# Patient Record
Sex: Female | Born: 1935 | Race: White | Hispanic: No | Smoking: Former smoker
Health system: Southern US, Community
[De-identification: ages and names within clinical notes are randomized; demographics above are authoritative.]

## PROBLEM LIST (undated history)

## (undated) DIAGNOSIS — M1990 Unspecified osteoarthritis, unspecified site: Secondary | ICD-10-CM

## (undated) DIAGNOSIS — M545 Low back pain, unspecified: Secondary | ICD-10-CM

## (undated) DIAGNOSIS — G629 Polyneuropathy, unspecified: Secondary | ICD-10-CM

## (undated) DIAGNOSIS — K5190 Ulcerative colitis, unspecified, without complications: Secondary | ICD-10-CM

## (undated) DIAGNOSIS — R42 Dizziness and giddiness: Secondary | ICD-10-CM

## (undated) DIAGNOSIS — J449 Chronic obstructive pulmonary disease, unspecified: Secondary | ICD-10-CM

## (undated) DIAGNOSIS — K219 Gastro-esophageal reflux disease without esophagitis: Secondary | ICD-10-CM

## (undated) DIAGNOSIS — D649 Anemia, unspecified: Secondary | ICD-10-CM

## (undated) DIAGNOSIS — J4489 Other specified chronic obstructive pulmonary disease: Secondary | ICD-10-CM

## (undated) DIAGNOSIS — R0989 Other specified symptoms and signs involving the circulatory and respiratory systems: Secondary | ICD-10-CM

## (undated) DIAGNOSIS — L409 Psoriasis, unspecified: Secondary | ICD-10-CM

## (undated) DIAGNOSIS — F329 Major depressive disorder, single episode, unspecified: Secondary | ICD-10-CM

## (undated) DIAGNOSIS — F32A Depression, unspecified: Secondary | ICD-10-CM

## (undated) DIAGNOSIS — R251 Tremor, unspecified: Secondary | ICD-10-CM

## (undated) DIAGNOSIS — I10 Essential (primary) hypertension: Secondary | ICD-10-CM

## (undated) DIAGNOSIS — E119 Type 2 diabetes mellitus without complications: Secondary | ICD-10-CM

## (undated) HISTORY — DX: Low back pain: M54.5

## (undated) HISTORY — DX: Unspecified osteoarthritis, unspecified site: M19.90

## (undated) HISTORY — PX: EYE SURGERY: SHX253

## (undated) HISTORY — PX: APPENDECTOMY: SHX54

## (undated) HISTORY — DX: Gastro-esophageal reflux disease without esophagitis: K21.9

## (undated) HISTORY — DX: Other specified symptoms and signs involving the circulatory and respiratory systems: R09.89

## (undated) HISTORY — DX: Dizziness and giddiness: R42

## (undated) HISTORY — DX: Major depressive disorder, single episode, unspecified: F32.9

## (undated) HISTORY — DX: Anemia, unspecified: D64.9

## (undated) HISTORY — DX: Tremor, unspecified: R25.1

## (undated) HISTORY — DX: Depression, unspecified: F32.A

## (undated) HISTORY — DX: Low back pain, unspecified: M54.50

## (undated) HISTORY — DX: Ulcerative colitis, unspecified, without complications: K51.90

## (undated) HISTORY — PX: ABDOMINAL HYSTERECTOMY: SHX81

## (undated) HISTORY — DX: Psoriasis, unspecified: L40.9

## (undated) HISTORY — DX: Chronic obstructive pulmonary disease, unspecified: J44.9

## (undated) HISTORY — DX: Other specified chronic obstructive pulmonary disease: J44.89

---

## 2005-10-18 ENCOUNTER — Ambulatory Visit: Payer: Self-pay

## 2007-08-03 ENCOUNTER — Inpatient Hospital Stay: Payer: Self-pay | Admitting: Internal Medicine

## 2007-08-03 ENCOUNTER — Other Ambulatory Visit: Payer: Self-pay

## 2007-09-25 ENCOUNTER — Emergency Department: Payer: Self-pay | Admitting: Emergency Medicine

## 2007-09-25 ENCOUNTER — Other Ambulatory Visit: Payer: Self-pay

## 2008-01-22 ENCOUNTER — Ambulatory Visit: Payer: Self-pay | Admitting: Family Medicine

## 2008-10-15 ENCOUNTER — Ambulatory Visit: Payer: Self-pay | Admitting: Rheumatology

## 2008-10-15 ENCOUNTER — Ambulatory Visit: Payer: Self-pay | Admitting: Specialist

## 2009-07-16 ENCOUNTER — Observation Stay: Payer: Self-pay | Admitting: Internal Medicine

## 2010-01-04 ENCOUNTER — Ambulatory Visit: Payer: Self-pay | Admitting: Gastroenterology

## 2010-01-05 LAB — PATHOLOGY REPORT

## 2010-04-01 ENCOUNTER — Emergency Department: Payer: Self-pay | Admitting: Emergency Medicine

## 2010-04-08 ENCOUNTER — Ambulatory Visit: Payer: Self-pay | Admitting: Specialist

## 2010-04-11 ENCOUNTER — Ambulatory Visit: Payer: Self-pay | Admitting: Specialist

## 2011-05-25 ENCOUNTER — Ambulatory Visit: Payer: Self-pay | Admitting: Orthopedic Surgery

## 2011-07-12 ENCOUNTER — Encounter: Payer: Self-pay | Admitting: Orthopedic Surgery

## 2011-08-04 ENCOUNTER — Encounter: Payer: Self-pay | Admitting: Orthopedic Surgery

## 2011-09-04 ENCOUNTER — Encounter: Payer: Self-pay | Admitting: Orthopedic Surgery

## 2011-09-15 ENCOUNTER — Emergency Department: Payer: Self-pay | Admitting: Internal Medicine

## 2011-09-15 LAB — COMPREHENSIVE METABOLIC PANEL
Albumin: 3.9 g/dL (ref 3.4–5.0)
Alkaline Phosphatase: 66 U/L (ref 50–136)
Anion Gap: 9 (ref 7–16)
Bilirubin,Total: 0.3 mg/dL (ref 0.2–1.0)
Calcium, Total: 9.8 mg/dL (ref 8.5–10.1)
Co2: 31 mmol/L (ref 21–32)
Creatinine: 1.04 mg/dL (ref 0.60–1.30)
EGFR (African American): 60 — ABNORMAL LOW
EGFR (Non-African Amer.): 51 — ABNORMAL LOW
Glucose: 124 mg/dL — ABNORMAL HIGH (ref 65–99)
Osmolality: 282 (ref 275–301)
Potassium: 3.7 mmol/L (ref 3.5–5.1)
SGOT(AST): 28 U/L (ref 15–37)
Sodium: 139 mmol/L (ref 136–145)
Total Protein: 7.7 g/dL (ref 6.4–8.2)

## 2011-09-15 LAB — CK TOTAL AND CKMB (NOT AT ARMC)
CK, Total: 69 U/L (ref 21–215)
CK-MB: 0.9 ng/mL (ref 0.5–3.6)

## 2011-09-15 LAB — TROPONIN I: Troponin-I: <0.02 ng/mL

## 2011-09-15 LAB — CBC
HCT: 36.8 % (ref 35.0–47.0)
MCH: 32.9 pg (ref 26.0–34.0)
RBC: 3.63 10*6/uL — ABNORMAL LOW (ref 3.80–5.20)
RDW: 12.6 % (ref 11.5–14.5)
WBC: 11.3 10*3/uL — ABNORMAL HIGH (ref 3.6–11.0)

## 2011-09-15 LAB — PROTIME-INR: Prothrombin Time: 13.1 secs (ref 11.5–14.7)

## 2011-10-04 ENCOUNTER — Encounter: Payer: Self-pay | Admitting: Orthopedic Surgery

## 2011-11-20 ENCOUNTER — Ambulatory Visit: Payer: Self-pay | Admitting: Orthopedic Surgery

## 2012-02-18 ENCOUNTER — Inpatient Hospital Stay: Payer: Self-pay | Admitting: Internal Medicine

## 2012-02-18 LAB — CBC
HCT: 42.4 % (ref 35.0–47.0)
HGB: 14.4 g/dL (ref 12.0–16.0)
MCH: 33.7 pg (ref 26.0–34.0)
MCV: 100 fL (ref 80–100)
RDW: 12.3 % (ref 11.5–14.5)
WBC: 13.4 10*3/uL — ABNORMAL HIGH (ref 3.6–11.0)

## 2012-02-18 LAB — COMPREHENSIVE METABOLIC PANEL
Alkaline Phosphatase: 129 U/L (ref 50–136)
Bilirubin,Total: 0.7 mg/dL (ref 0.2–1.0)
Calcium, Total: 10.4 mg/dL — ABNORMAL HIGH (ref 8.5–10.1)
Chloride: 88 mmol/L — ABNORMAL LOW (ref 98–107)
Co2: 29 mmol/L (ref 21–32)
Creatinine: 1.26 mg/dL (ref 0.60–1.30)
EGFR (African American): 47 — ABNORMAL LOW
EGFR (Non-African Amer.): 40 — ABNORMAL LOW
Glucose: 655 mg/dL (ref 65–99)
Osmolality: 291 (ref 275–301)
Potassium: 4.4 mmol/L (ref 3.5–5.1)
SGPT (ALT): 46 U/L (ref 12–78)

## 2012-02-18 LAB — URINALYSIS, COMPLETE
Bilirubin,UR: NEGATIVE
Blood: NEGATIVE
Ph: 6 (ref 4.5–8.0)
Protein: 30
RBC,UR: 1 /HPF (ref 0–5)

## 2012-02-18 LAB — CK TOTAL AND CKMB (NOT AT ARMC)
CK, Total: 66 U/L (ref 21–215)
CK-MB: 1.5 ng/mL (ref 0.5–3.6)

## 2012-02-18 LAB — TROPONIN I: Troponin-I: <0.02 ng/mL

## 2012-02-18 LAB — OSMOLALITY: Osmolality: 547 mOsm/kg (ref 280–301)

## 2012-02-19 LAB — CBC WITH DIFFERENTIAL/PLATELET
Basophil #: 0 10*3/uL (ref 0.0–0.1)
Eosinophil %: 3.5 %
HCT: 38.4 % (ref 35.0–47.0)
HGB: 13.3 g/dL (ref 12.0–16.0)
MCH: 34.1 pg — ABNORMAL HIGH (ref 26.0–34.0)
MCHC: 34.7 g/dL (ref 32.0–36.0)
MCV: 98 fL (ref 80–100)
Monocyte #: 0.8 x10 3/mm (ref 0.2–0.9)
Monocyte %: 8.4 %
Neutrophil #: 6.1 10*3/uL (ref 1.4–6.5)
Neutrophil %: 63.6 %
Platelet: 262 10*3/uL (ref 150–440)
RBC: 3.9 10*6/uL (ref 3.80–5.20)

## 2012-02-19 LAB — BASIC METABOLIC PANEL
Anion Gap: 12 (ref 7–16)
BUN: 16 mg/dL (ref 7–18)
Chloride: 101 mmol/L (ref 98–107)
Co2: 27 mmol/L (ref 21–32)
Creatinine: 0.89 mg/dL (ref 0.60–1.30)
Glucose: 141 mg/dL — ABNORMAL HIGH (ref 65–99)
Osmolality: 283 (ref 275–301)
Potassium: 3.4 mmol/L — ABNORMAL LOW (ref 3.5–5.1)
Sodium: 140 mmol/L (ref 136–145)

## 2012-02-20 LAB — BASIC METABOLIC PANEL
Anion Gap: 10 (ref 7–16)
Co2: 26 mmol/L (ref 21–32)
Creatinine: 0.82 mg/dL (ref 0.60–1.30)
EGFR (African American): >60
EGFR (Non-African Amer.): >60
Glucose: 112 mg/dL — ABNORMAL HIGH (ref 65–99)
Sodium: 143 mmol/L (ref 136–145)

## 2014-09-22 NOTE — Discharge Summary (Signed)
PATIENT NAME:  Chelsea [name], Chelsea [name] MR#:  [id number] DATE OF BIRTH:  [birth year]  DATE OF ADMISSION:  02/18/2012 DATE OF DISCHARGE:  02/20/2012  ADMITTING DIAGNOSES: Confusion, hallucinations, hyperglycemia.   DISCHARGE DIAGNOSES:  1. Acute encephalopathy due to nonketotic hyperosmolar state with severe dehydration, severe hyperglycemia. Mental status back to her normal.  2. New onset diabetes. The patient was seen by Dr. Gabriel Carina and  started on oral treatment.  3. Hyponatremia felt to be pseudohyponatremia from hyperglycemia.  Sodium normalized after correction of her blood glucose.  4. Hypertension.  5. Neuropathy.  6. History of ulcerative colitis.  7. Neck spasm.  8. History of ulcerative colitis.  9. Gastroesophageal reflux disease.  10. Osteoarthritis  11. Hypertension.  12. History of chronic obstructive pulmonary disease.   CONSULTANTS: Dr. Gabriel Carina  PERTINENT LABORATORY DATA AND EVALUATIONS: Serum glucose on admission 655, BUN 22, creatinine 1.2, sodium 128, potassium 4.4, chloride 88, bicarbonate 29. LFTs were normal. CPK was 66. Troponin less than 0.02. WBC count 13.4, hemoglobin 14.4, platelet count 329. Urinalysis showed glucosuria, 1+ leukocytes, 15 WBCs. Her sodium on day two on 09/17 was 143. Hemoglobin A1c was 10. EKG showed sinus tachycardia on presentation.   HOSPITAL COURSE: Please refer to the History and Physical done by the admitting physician. The patient is a 81 year old white female who was brought to the ED after having confusion and hallucinations. She was also noted to have a rash throughout her body and was seen by primary care physician. The patient was started on a prednisone taper. She was on a 10-day taper. On the third day of the taper her daughters noticed she was having delusions, hallucinations, seeing things. Her rash was significantly improved. She came to the ED and was noted to have a blood sugar in the 700s. We were asked to admit the patient for hyperosmolar  nonketotic state. She was treated with aggressive IV fluids and was started on insulin drip. She was closely monitored in the Intensive Care Unit. Overnight her blood sugars started trending downwards and she was started on insulin. Endocrinology consult was obtained. Dr. Gabriel Carina felt that the patient would do okay with oral treatment. She was started on Glucovance which she tolerated well. Her sugars were stable at discharge. At this time, the patient is doing much better and is stable for discharge. She will need closer monitoring as an outpatient.   DISCHARGE MEDICATIONS:  1. Citrucel 1 tab p.o. t.i.d.  2. Metamucil 500, 4 caps p.o. b.i.d.  3. Vitamin B complex 1 tab p.o. daily.  4. Prilosec 20 daily.  5. Advair Diskus 250/50, 1 inhalation daily.  6. Etodolac 400, 1 tabs p.o. b.i.d.  7. Hydrochlorothiazide/losartan 25/100 daily.  8. Paroxetine 40 daily.  9. Acetaminophen hydrocodone 325/5, 1 tab p.o. q. 6.  10. Metoprolol succinate 25 mg 1 tab p.o. b.i.d.  11. Allergy Relief 1 tab p.o. b.i.d.  12. Folic acid 1 mg daily.  13. Benzonatate 100 mg 1 tab p.o. t.i.d. as needed.  14. Sulfasalazine 2 tabs b.i.d.  15. Gabapentin 900 mg 3 times per day.  16. Glyburide/ metformin 2.5/500, 1 tab p.o. b.i.d.  17. Cyclobenzaprine 10 mg 1 tab p.o. t.i.d. as needed.   HOME OXYGEN: Yes. The patient is to continue to use oxygen as previously.  FOLLOWUP:  1. Follow up with Dr. Gabriel Carina in 1 to 2 weeks. 2. Follow up with Dr. Lovie Macadamia in 1 to 2 weeks.   The patient's daughter is recommended to keep a log of blood  sugars to take to Dr. Gabriel Carina.  She is told to check blood glucose before each meal.       TIME SPENT: 32 minutes.   ____________________________ Lafonda Mosses Posey Pronto, MD shp:bjt D: 02/21/2012 14:07:45 ET T: 02/21/2012 14:33:07 ET JOB#: [id number]  cc: Dajia Gunnels H. Posey Pronto, MD, <Dictator> Youlanda Roys. Lovie Macadamia, MD Alric Seton MD ELECTRONICALLY SIGNED 02/22/2012 21:50

## 2014-09-22 NOTE — Consult Note (Signed)
PATIENT NAME:  Chelsea [name], Chelsea [name] MR#:  [id number] DATE OF BIRTH:  July 27, 1930  DATE OF CONSULTATION:  02/19/2012  REFERRING PHYSICIAN: Abel Presto, MD CONSULTING PHYSICIAN:  A. Lavone Orn, MD  CHIEF COMPLAINT: Uncontrolled diabetes.   HISTORY OF PRESENT ILLNESS: This is a 81 year old female who was admitted yesterday with altered mental status and severe hyperglycemia. Initial blood sugar was over 700. She did not have any evidence of ketoacidosis. She was initiated on IV insulin and IV fluids. Blood sugars quickly normalized and she was transitioned to subcutaneous insulin this morning. She was given Lantus 20 units one time and the IV insulin was stopped. Her appetite has been fair. She denies any nausea. She denies any pain. Altered mental status seems to have resolved. The patient still does not feel quite back to herself, however. She has had a diagnosis of diabetes, diet controlled, since 2010. Her A1c had been consistently under 7% up until it was recently checked on 02/12/2012 when it was found to be 9.1%. She was also started on that date on prednisone, a tapering course over 12 days, initially at 60 mg daily for a facial rash. In the two days preceding her hospitalization, she had noted increased thirst and polyuria. She denies any blurred vision or weight change.   PAST MEDICAL HISTORY:  1. Diabetes mellitus.  2. Hypertension.  3. Osteoarthritis.  4. Chronic obstructive pulmonary disease/asthma.  5. Obesity.  6. Peripheral neuropathy.  7. Psoriasis.  8. Anemia.  9. Osteoarthritis.  10. Gastroesophageal reflux disease.   PAST SURGICAL HISTORY:  1. Cholecystectomy.  2. Hysterectomy.  3. Right foot spur removal.  4. Cataract repair.  5. Left knee arthroscopy.   CURRENT INPATIENT MEDICATIONS:  1. Etodolac 400 mg b.i.d.  2. Folic acid 1 mg daily.  3. Neurontin 900 mg t.i.d.  4. Toprol-XL 25 mg b.i.d.  5. Sulfasalazine 1000 mg every 12 hours.  6. Lantus 20 units one time  daily.  7. NovoLog insulin modified sliding scale.  8. Advair Diskus 250/50, one puff b.i.d.   ALLERGIES: No known drug allergies.   SOCIAL HISTORY: The patient is a widow. She is retired. No tobacco use, quit in 1992. No alcohol use.   FAMILY HISTORY: Mother had diabetes.   REVIEW OF SYSTEMS: GENERAL: No recent weight change. No fever. HEENT: No blurred vision. She does report occasionally "seeing spots" for the last two days only, not changed since hospitalization. No sore throat. NECK: No neck pain. No dysphasia. CARDIAC: No chest pain. No palpitations. PULMONARY: No cough. No wheeze. She does have shortness of breath and uses O2 at night. GASTROINTESTINAL: Fair appetite. No nausea or vomiting. EXTREMITIES: Denies leg swelling. SKIN: Denies rash or pruritus. HEME: Denies easy bruisability or recent bleeding. ENDOCRINE: Denies heat or cold intolerance. NEUROLOGIC: Denies headaches or recent falls.   PHYSICAL EXAMINATION:   VITAL SIGNS: Height 61.9 inches, weight 186 pounds, BMI 34.1, temperature 97.9, pulse 86, respirations 20, blood pressure 147/77, pulse oximetry 94% on 2 liters O2.   GENERAL: Obese white female in no acute distress.   HEENT: Extraocular movements are intact. Oropharynx is clear. Mucous membranes moist.   NECK: Supple. No thyromegaly. No neck tenderness.   CARDIAC: Regular rate and rhythm. No carotid bruit.   LUNGS: Clear to auscultation bilaterally. No wheeze. Good inspiratory effort.   ABDOMEN: Diffusely soft, nontender, nondistended.   EXTREMITIES: No edema is present.   NEUROLOGIC: No appreciable sensory deficits.   SKIN: No rash or pruritus.   PSYCHIATRIC: Alert  and oriented x3.   LABORATORY, RADIOLOGICAL AND DIAGNOSTIC DATA: Glucose 141, BUN 16, creatinine 0.89, sodium 140, potassium 3.4, chloride 101, CO2 27, eGFR greater than 60, calcium 9.3. Hematocrit 38.4, WBC 9.5, and platelets 262.   ASSESSMENT: A 81 year old female with history of  diet-controlled diabetes, recently worsened prior to admission with an A1c of 9.1% last week. Sugars worsened further after the initiation of steroids, prompting uncontrolled severe hyperglycemia and the altered mental status.   RECOMMENDATIONS:  1. Seems to be doing fairly well on once a day insulin. I suspect she might have reasonable control on oral hypoglycemic medications. I plan to add Glucovance 2.5/500 mg 1 tab twice daily.  2. Continue before meals and at bedtime fingerstick blood sugar monitoring with sliding scale. I plan to modify the sliding scale slightly to make it less aggressive.  3. I will arrange for outpatient follow-up in one week.  4. From diabetes standpoint, the patient likely could go home tomorrow if sugars remain under 250.   Thank you for the kind request for consultation.  ____________________________ A. Lavone Orn, MD ams:ap D: 02/19/2012 16:17:12 ET              T: 02/20/2012 10:17:01 ET JOB#: [id number] cc: A. Lavone Orn, MD, <Dictator> Sherlon Handing MD ELECTRONICALLY SIGNED 02/23/2012 13:54

## 2014-09-22 NOTE — Consult Note (Signed)
Chief Complaint and History:   Referring Physician Dr. Verdell Carmine    Chief Complaint uncontrolled diabetes   Allergies:  No Known Allergies:   Assessment/Plan:   Assessment/Plan This is a 81 year old female seen in consultation. [facility] from [facility] and [facility] were reviewed. She has had diabetes since 2010 (A1c was 6.7% in 2010) which has been managed with diet alone. She was treated with prednisone for a rash starting on 02/12/12 and this likely provoked severe hyperglycemia. She presented on 9/15 with glucose level >700 and altered mental status. After treatment with IVF and IV insulin, sugars normalized. She received Lantus 20 units one time today.  A / Uncontrolled diabetes  P/ 1. Will try oral medications for diabetes. She has a normal creatinine. Will start glucovance 2.5-500 mg po bid. Continue SSI and qACHS FSBS. Will DC insulin at this time, although if she requires significant insuolin on her SSI, we could add mack some bedtime basal insulin. 2. Needs diabetes education and a glucometer prior to discharge.  Will follow with you. Full consult to be dictated. Will arrange out-patient follow-up.   Electronic Signatures: Judi Cong (MD)  (Signed 16-Sep-13 14:01)  Authored: Chief Complaint and History, ALLERGIES, Assessment/Plan   Last Updated: 16-Sep-13 14:01 by Judi Cong (MD)

## 2014-09-22 NOTE — H&P (Signed)
PATIENT NAME:  Chelsea [name], BORAN MR#:  [id number] DATE OF BIRTH:  30-Apr-1931  DATE OF ADMISSION:  02/18/2012  PRIMARY CARE PHYSICIAN: Juluis Pitch, MD   CHIEF COMPLAINT: Confusion, hallucinations, and new onset diabetes.   HISTORY OF PRESENT ILLNESS: This is a 81 year old female who presents to the Emergency Room due to having some delirium and some confusion and noted to have severe hyperglycemia. The patient apparently was noted to have a rash throughout her body and was seen by her primary care physician, Dr. Lovie Macadamia, the early part of this week and started on a prednisone taper. The patient is on a 10-day taper starting at 50 mg. The patient is on her third day of her taper and her daughter noticed that she has been having more delusions and hallucinations where she has been seeing things. Her rash was starting to subside but her neurological symptoms were not improving. As per the daughter usually when she has a urinary tract infection this happens to her so she got her mother's urine tested but the results are still pending. Since her symptoms were not improving, she brought her to Acute Care at [facility]. At the Acute Care the patient's blood sugar was noted to be over 700. She was brought to the ER and was noted to have persistent hyperglycemia and noted to be in a nonketotic hyperosmolar state. The patient was given IV fluids and started on an insulin drip and hospitalist service was contacted for further treatment and evaluation.   REVIEW OF SYSTEMS: CONSTITUTIONAL: No documented fever. No weight gain, no weight loss. EYES: No blurry or double vision. ENT: No tinnitus, no postnasal drip, no redness of the oropharynx. RESPIRATORY: No cough, no wheeze, no hemoptysis, no dyspnea. CARDIOVASCULAR: No chest pain, no orthopnea, no palpitations, no syncope. GI: No nausea, no vomiting, no diarrhea, no abdominal pain, no melena, no hematochezia. GU: No dysuria, no hematuria. Positive polyuria.  ENDOCRINE: Positive polyuria. No nocturia. Positive increased thirst. No increased heat or cold intolerance. HEME: No anemia, no bruising, no bleeding. INTEGUMENTARY: No rashes, no lesions. MUSCULOSKELETAL: No arthritis, no swelling, no gout. NEUROLOGIC: No numbness, no tingling, no ataxia, no seizure-type activity. PSYCH: No anxiety, no insomnia, no ADD.   PAST MEDICAL HISTORY:  1. Hypertension. 2. Osteoarthritis. 3. Gastroesophageal reflux disease.  4. Ulcerative colitis. 5. Neuropathy. 6. Chronic obstructive pulmonary disease.   ALLERGIES: No known drug allergies.   SOCIAL HISTORY: No smoking. No alcohol abuse. No illicit drug abuse. Lives at home with her daughter.   FAMILY HISTORY: Significant for coronary disease and also diabetes on mother and father's side of the family.   CURRENT MEDICATIONS:  1. Advair 250/50 one puff b.i.d.  2. Benadryl as needed.  3. Tessalon Perles t.i.d. as needed. 4. Citracal with Vitamin D 1 tab b.i.d.  5. Etodolac 400 mg b.i.d.  6. Folic acid 1 mg daily.  7. Gabapentin 900 mg t.i.d.  8. Metamucil 500 mg four caps t.i.d. as needed.  9. Metoprolol succinate 25 mg b.i.d.  10. Seroquel 25 mg at bedtime.  11. Prilosec 20 mg daily.  12. Sulfasalazine 500 mg 2 tabs b.i.d.  13. Vitamin B supplement daily.   PHYSICAL EXAMINATION ON ADMISSION:   VITAL SIGNS: Temperature 97.3, pulse 95, respirations 20, blood pressure 155/69, sats 92% on room air.   GENERAL: She is a pleasant appearing female but in no apparent distress.   HEENT: Atraumatic, normocephalic. Extraocular muscles are intact. Pupils equal and reactive to light. Sclerae anicteric. No conjunctival  injection. No pharyngeal erythema.   NECK: Supple. No jugular venous distention. No bruits. No lymphadenopathy. No thyromegaly.   HEART: Regular rate and rhythm. No murmurs, no rubs, no clicks.   LUNGS: Clear to auscultation bilaterally. No rales, no rhonchi, no wheezes.   ABDOMEN: Soft, flat,  nontender, nondistended. Has good bowel sounds. No hepatosplenomegaly appreciated.   EXTREMITIES: No evidence of any cyanosis, clubbing, or peripheral edema. Has +2 pedal and radial pulses bilaterally.   NEUROLOGIC: The patient is alert, awake, and oriented x3 with no focal motor or sensory deficits appreciated bilaterally.   SKIN: Moist and warm with no rashes appreciated.   LYMPHATIC: There is no cervical or axillary lymphadenopathy.   LABORATORY, DIAGNOSTIC, AND RADIOLOGICAL DATA: Serum glucose 655, BUN 22, creatinine 1.2, sodium 128, potassium 4.4, chloride 88, bicarb 29. LFTs are within normal limits. CK 66. Troponin less than 0.02. White cell count 13.4, hemoglobin 14.4, hematocrit 42.4, platelet count 329. Urinalysis shows glucosuria, 1+ leukocyte esterase, 5 white cells.   ASSESSMENT AND PLAN: This is a 81 year old female with history of ulcerative colitis, COPD, neuropathy, hypertension, gastroesophageal reflux disease, and osteoarthritis who presents to the hospital with confusion, hallucinations and noted to be in a nonketotic hyperosmolar state with new onset diabetes.  1. Nonketotic hyperosmolar state. The patient presented with significantly elevated blood sugars likely due to her new onset diabetes. No evidence of any anion gap. She has a strong family history of diabetes, therefore, this is probably type II diabetes. For now will aggressively treat her with IV fluids, place on an insulin drip, non-DKA protocol. Follow her blood sugars closely. Follow her mental status which seems to be at baseline presently.  2. New onset diabetes. She probably has type II diabetes as mentioned. She has a strong family history. She may have had early glucose intolerance over the past few years and because she was started on a prednisone taper for a rash this lead to her elevated sugars showing that she probably had underlying diabetes. For now I will continue aggressive IV fluids. Insulin drip. Follow  her blood sugars closely. Will get an Endocrinology consult with Dr. Gabriel Carina in the morning. Also, refer her for diabetic lifestyle.  3. Hyponatremia. This is likely pseudohyponatremia from the hyperglycemia and should improve with correction of her blood sugars. Will follow her sodium.  4. Hypertension. Continue metoprolol.  5. Neuropathy. Continue Neurontin.  6. History of ulcerative colitis. Currently is stable. No evidence of any diarrhea. Continue sulfasalazine.  7. Gastroesophageal reflux disease. Continue Prilosec.  8. Osteoarthritis. Continue etodolac.   CODE STATUS: The patient is a FULL CODE.   TIME SPENT WITH THE ADMISSION: 50 minutes.   ____________________________ Belia Heman. Verdell Carmine, MD vjs:drc D: 02/18/2012 17:58:26 ET T: 02/19/2012 05:43:05 ET JOB#: [id number]  cc: Belia Heman. Verdell Carmine, MD, <Dictator> Youlanda Roys. Lovie Macadamia, MD Henreitta Leber MD ELECTRONICALLY SIGNED 02/19/2012 20:25

## 2015-02-06 ENCOUNTER — Inpatient Hospital Stay
Admission: EM | Admit: 2015-02-06 | Discharge: 2015-02-07 | DRG: 195 | Disposition: A | Discharge status: Home / Self Care | Payer: Medicare Other | Attending: Internal Medicine | Admitting: Internal Medicine

## 2015-02-06 ENCOUNTER — Encounter: Payer: Self-pay | Admitting: *Deleted

## 2015-02-06 ENCOUNTER — Emergency Department: Payer: Medicare Other

## 2015-02-06 DIAGNOSIS — R748 Abnormal levels of other serum enzymes: Secondary | ICD-10-CM | POA: Diagnosis present

## 2015-02-06 DIAGNOSIS — J449 Chronic obstructive pulmonary disease, unspecified: Secondary | ICD-10-CM | POA: Diagnosis present

## 2015-02-06 DIAGNOSIS — E1142 Type 2 diabetes mellitus with diabetic polyneuropathy: Secondary | ICD-10-CM | POA: Diagnosis present

## 2015-02-06 DIAGNOSIS — J189 Pneumonia, unspecified organism: Secondary | ICD-10-CM | POA: Diagnosis not present

## 2015-02-06 DIAGNOSIS — I10 Essential (primary) hypertension: Secondary | ICD-10-CM | POA: Diagnosis present

## 2015-02-06 DIAGNOSIS — Z87891 Personal history of nicotine dependence: Secondary | ICD-10-CM | POA: Diagnosis not present

## 2015-02-06 DIAGNOSIS — Z888 Allergy status to other drugs, medicaments and biological substances status: Secondary | ICD-10-CM

## 2015-02-06 DIAGNOSIS — D72829 Elevated white blood cell count, unspecified: Secondary | ICD-10-CM | POA: Diagnosis not present

## 2015-02-06 DIAGNOSIS — Z90710 Acquired absence of both cervix and uterus: Secondary | ICD-10-CM

## 2015-02-06 DIAGNOSIS — Z833 Family history of diabetes mellitus: Secondary | ICD-10-CM | POA: Diagnosis not present

## 2015-02-06 DIAGNOSIS — R55 Syncope and collapse: Secondary | ICD-10-CM | POA: Diagnosis present

## 2015-02-06 DIAGNOSIS — Z66 Do not resuscitate: Secondary | ICD-10-CM | POA: Diagnosis present

## 2015-02-06 DIAGNOSIS — W19XXXA Unspecified fall, initial encounter: Secondary | ICD-10-CM

## 2015-02-06 DIAGNOSIS — Z8249 Family history of ischemic heart disease and other diseases of the circulatory system: Secondary | ICD-10-CM | POA: Diagnosis not present

## 2015-02-06 DIAGNOSIS — R531 Weakness: Secondary | ICD-10-CM

## 2015-02-06 DIAGNOSIS — R918 Other nonspecific abnormal finding of lung field: Secondary | ICD-10-CM | POA: Diagnosis present

## 2015-02-06 DIAGNOSIS — A419 Sepsis, unspecified organism: Secondary | ICD-10-CM | POA: Diagnosis present

## 2015-02-06 HISTORY — DX: Chronic obstructive pulmonary disease, unspecified: J44.9

## 2015-02-06 HISTORY — DX: Type 2 diabetes mellitus without complications: E11.9

## 2015-02-06 LAB — URINALYSIS COMPLETE WITH MICROSCOPIC (ARMC ONLY)
Bacteria, UA: NONE SEEN
Bilirubin Urine: NEGATIVE
GLUCOSE, UA: NEGATIVE mg/dL
KETONES UR: NEGATIVE mg/dL
Leukocytes, UA: NEGATIVE
NITRITE: NEGATIVE
Protein, ur: 100 mg/dL — AB
SPECIFIC GRAVITY, URINE: 1.01 (ref 1.005–1.030)
Squamous Epithelial / LPF: NONE SEEN
pH: 7 (ref 5.0–8.0)

## 2015-02-06 LAB — CBC WITH DIFFERENTIAL/PLATELET
Basophils Absolute: 0.1 10*3/uL (ref 0–0.1)
Basophils Relative: 0 %
EOS ABS: 0 10*3/uL (ref 0–0.7)
EOS PCT: 0 %
HCT: 35.6 % (ref 35.0–47.0)
Hemoglobin: 11.8 g/dL — ABNORMAL LOW (ref 12.0–16.0)
LYMPHS ABS: 0.8 10*3/uL — AB (ref 1.0–3.6)
LYMPHS PCT: 4 %
MCH: 31.6 pg (ref 26.0–34.0)
MCHC: 33.2 g/dL (ref 32.0–36.0)
MCV: 95.3 fL (ref 80.0–100.0)
MONO ABS: 1 10*3/uL — AB (ref 0.2–0.9)
MONOS PCT: 5 %
Neutro Abs: 20.3 10*3/uL — ABNORMAL HIGH (ref 1.4–6.5)
Neutrophils Relative %: 91 %
PLATELETS: 310 10*3/uL (ref 150–440)
RBC: 3.74 MIL/uL — ABNORMAL LOW (ref 3.80–5.20)
RDW: 13.3 % (ref 11.5–14.5)
WBC: 22.3 10*3/uL — AB (ref 3.6–11.0)

## 2015-02-06 LAB — COMPREHENSIVE METABOLIC PANEL
ALT: 34 U/L (ref 14–54)
ANION GAP: 9 (ref 5–15)
AST: 101 U/L — ABNORMAL HIGH (ref 15–41)
Albumin: 4 g/dL (ref 3.5–5.0)
Alkaline Phosphatase: 57 U/L (ref 38–126)
BUN: 22 mg/dL — ABNORMAL HIGH (ref 6–20)
CHLORIDE: 98 mmol/L — AB (ref 101–111)
CO2: 29 mmol/L (ref 22–32)
CREATININE: 0.86 mg/dL (ref 0.44–1.00)
Calcium: 9.6 mg/dL (ref 8.9–10.3)
Glucose, Bld: 120 mg/dL — ABNORMAL HIGH (ref 65–99)
Potassium: 4.3 mmol/L (ref 3.5–5.1)
SODIUM: 136 mmol/L (ref 135–145)
Total Bilirubin: 0.4 mg/dL (ref 0.3–1.2)
Total Protein: 7.1 g/dL (ref 6.5–8.1)

## 2015-02-06 LAB — GLUCOSE, CAPILLARY: Glucose-Capillary: 118 mg/dL — ABNORMAL HIGH (ref 65–99)

## 2015-02-06 LAB — CK: CK TOTAL: 4670 U/L — AB (ref 38–234)

## 2015-02-06 LAB — TROPONIN I: TROPONIN I: 0.19 ng/mL — AB (ref <0.031)

## 2015-02-06 MED ORDER — ENOXAPARIN SODIUM 40 MG/0.4ML ~~LOC~~ SOLN
40.0000 mg | SUBCUTANEOUS | Status: DC
  Administered 2015-02-06: 40 mg via SUBCUTANEOUS
  Filled 2015-02-06: qty 0.4

## 2015-02-06 MED ORDER — ONDANSETRON HCL 4 MG PO TABS: 4.0000 mg | ORAL_TABLET | Freq: Four times a day (QID) | ORAL | Status: DC | PRN

## 2015-02-06 MED ORDER — SULFASALAZINE 500 MG PO TBEC
1000.0000 mg | DELAYED_RELEASE_TABLET | Freq: Two times a day (BID) | ORAL | Status: DC
  Filled 2015-02-06 (×3): qty 2

## 2015-02-06 MED ORDER — SODIUM CHLORIDE 0.9 % IV SOLN
INTRAVENOUS | Status: DC
  Administered 2015-02-06: 23:00:00 via INTRAVENOUS

## 2015-02-06 MED ORDER — ACETAMINOPHEN 650 MG RE SUPP: 650.0000 mg | Freq: Four times a day (QID) | RECTAL | Status: DC | PRN

## 2015-02-06 MED ORDER — LEVOFLOXACIN IN D5W 750 MG/150ML IV SOLN
750.0000 mg | INTRAVENOUS | Status: DC
  Filled 2015-02-06: qty 150

## 2015-02-06 MED ORDER — ONDANSETRON HCL 4 MG/2ML IJ SOLN: 4.0000 mg | Freq: Four times a day (QID) | INTRAMUSCULAR | Status: DC | PRN

## 2015-02-06 MED ORDER — LEVOFLOXACIN IN D5W 750 MG/150ML IV SOLN
750.0000 mg | Freq: Once | INTRAVENOUS | Status: AC
  Administered 2015-02-06: 750 mg via INTRAVENOUS
  Filled 2015-02-06: qty 150

## 2015-02-06 MED ORDER — MOMETASONE FURO-FORMOTEROL FUM 100-5 MCG/ACT IN AERO
2.0000 | INHALATION_SPRAY | Freq: Two times a day (BID) | RESPIRATORY_TRACT | Status: DC
  Administered 2015-02-06 – 2015-02-07 (×2): 2 via RESPIRATORY_TRACT
  Filled 2015-02-06: qty 8.8

## 2015-02-06 MED ORDER — ASPIRIN 81 MG PO CHEW
324.0000 mg | CHEWABLE_TABLET | Freq: Once | ORAL | Status: AC
  Administered 2015-02-06: 324 mg via ORAL
  Filled 2015-02-06: qty 4

## 2015-02-06 MED ORDER — SENNOSIDES-DOCUSATE SODIUM 8.6-50 MG PO TABS: 1.0000 | ORAL_TABLET | Freq: Every evening | ORAL | Status: DC | PRN

## 2015-02-06 MED ORDER — INSULIN ASPART 100 UNIT/ML ~~LOC~~ SOLN
0.0000 [IU] | Freq: Three times a day (TID) | SUBCUTANEOUS | Status: DC
  Administered 2015-02-07 (×2): 1 [IU] via SUBCUTANEOUS
  Filled 2015-02-06 (×2): qty 1

## 2015-02-06 MED ORDER — METOPROLOL TARTRATE 25 MG PO TABS
25.0000 mg | ORAL_TABLET | Freq: Two times a day (BID) | ORAL | Status: DC
  Administered 2015-02-06 – 2015-02-07 (×2): 25 mg via ORAL
  Filled 2015-02-06 (×2): qty 1

## 2015-02-06 MED ORDER — ETODOLAC 400 MG PO TABS: 400.0000 mg | ORAL_TABLET | Freq: Two times a day (BID) | ORAL | Status: DC | PRN

## 2015-02-06 MED ORDER — ACETAMINOPHEN 325 MG PO TABS: 650.0000 mg | ORAL_TABLET | Freq: Four times a day (QID) | ORAL | Status: DC | PRN

## 2015-02-06 MED ORDER — LOSARTAN POTASSIUM 50 MG PO TABS
100.0000 mg | ORAL_TABLET | Freq: Every day | ORAL | Status: DC
  Administered 2015-02-07: 100 mg via ORAL
  Filled 2015-02-06: qty 2

## 2015-02-06 MED ORDER — INSULIN ASPART 100 UNIT/ML ~~LOC~~ SOLN: 0.0000 [IU] | Freq: Every day | SUBCUTANEOUS | Status: DC

## 2015-02-06 MED ORDER — ALUM & MAG HYDROXIDE-SIMETH 200-200-20 MG/5ML PO SUSP: 30.0000 mL | Freq: Four times a day (QID) | ORAL | Status: DC | PRN

## 2015-02-06 MED ORDER — QUETIAPINE FUMARATE 25 MG PO TABS
25.0000 mg | ORAL_TABLET | Freq: Every day | ORAL | Status: DC
  Administered 2015-02-06: 25 mg via ORAL
  Filled 2015-02-06: qty 1

## 2015-02-06 MED ORDER — SODIUM CHLORIDE 0.9 % IV SOLN
1000.0000 mL | Freq: Once | INTRAVENOUS | Status: AC
  Administered 2015-02-06: 1000 mL via INTRAVENOUS

## 2015-02-06 MED ORDER — SULFASALAZINE 500 MG PO TBEC: 1000.0000 mg | DELAYED_RELEASE_TABLET | Freq: Two times a day (BID) | ORAL | Status: DC

## 2015-02-06 MED ORDER — HYDROCODONE-ACETAMINOPHEN 5-325 MG PO TABS: 1.0000 | ORAL_TABLET | ORAL | Status: DC | PRN

## 2015-02-06 MED ORDER — METFORMIN HCL 500 MG PO TABS
500.0000 mg | ORAL_TABLET | Freq: Two times a day (BID) | ORAL | Status: DC
  Administered 2015-02-07: 500 mg via ORAL
  Filled 2015-02-06: qty 1

## 2015-02-06 NOTE — ED Notes (Signed)
Pt states she had a fall this am in her bedroom. Pt may have hit the rt side of her face on the nightstand. Family states pt has no strength. Pt suffers from neuroapthy. Pt is unable to bear weight on her legs at this time.

## 2015-02-06 NOTE — ED Provider Notes (Signed)
[facility] Emergency Department Provider Note     Time seen: ----------------------------------------- 3:45 PM on 02/06/2015 -----------------------------------------    I have reviewed the triage vital signs and the nursing notes.   HISTORY  Chief Complaint Fall    HPI Chelsea [name] is a 81 y.o. female who presents ER with very vague complaints of pain in the arms legs and back. Family members concerned because she is weak and can't bear weight. Patient was found facedown wedged in the floor of her room placed down. Irregular help her up, she had lost her bowel function. She has had progressive weakness with frequent falls, currently lives with her family. She denies any recent illness, just progressive weakness. Family also notes she looks pale.   Past Medical History  Diagnosis Date  . COPD (chronic obstructive pulmonary disease)   . Diabetes mellitus without complication     There are no active problems to display for this patient.   Past Surgical History  Procedure Laterality Date  . Abdominal hysterectomy      Allergies Lyrica  Social History Social History  Substance Use Topics  . Smoking status: Former Research scientist (life sciences)  . Smokeless tobacco: None  . Alcohol Use: No    Review of Systems Constitutional: Negative for fever. Eyes: Negative for visual changes. ENT: Negative for sore throat. Cardiovascular: Negative for chest pain. Respiratory: Negative for shortness of breath. Gastrointestinal: Negative for abdominal pain, vomiting and diarrhea. Genitourinary: Negative for dysuria. Musculoskeletal: Positive for generalized muscle pain back pain and leg pain.. Skin: Negative for rash. Neurological: Negative for headaches, focal weakness or numbness.  10-point ROS otherwise negative.  ____________________________________________   PHYSICAL EXAM:  VITAL SIGNS: ED Triage Vitals  Enc Vitals Group     BP 02/06/15 1426 137/53 mmHg   Pulse Rate 02/06/15 1426 102     Resp 02/06/15 1426 17     Temp 02/06/15 1426 98.5 F (36.9 C)     Temp Source 02/06/15 1426 Oral     SpO2 02/06/15 1426 100 %     Weight 02/06/15 1426 165 lb (74.844 kg)     Height 02/06/15 1426 5' 2"  (1.575 m)     Head Cir --      Peak Flow --      Pain Score 02/06/15 1427 7     Pain Loc --      Pain Edu? --      Excl. in [place]? --     Constitutional: Alert and oriented. Well appearing and in no distress. Eyes: Conjunctivae are normal. PERRL. Normal extraocular movements. ENT   Head: Small right cheek contusion        Nose: No congestion/rhinnorhea.   Mouth/Throat: Mucous membranes are moist.   Neck: No stridor. Cardiovascular: Normal rate, regular rhythm. Normal and symmetric distal pulses are present in all extremities. No murmurs, rubs, or gallops. Respiratory: Normal respiratory effort without tachypnea nor retractions. Breath sounds are clear and equal bilaterally. No wheezes/rales/rhonchi. Gastrointestinal: Soft and nontender. No distention. No abdominal bruits.  Musculoskeletal: Nontender with normal range of motion in all extremities. No joint effusions.  No lower extremity tenderness nor edema. Neurologic:  Normal speech and language. No gross focal neurologic deficits are appreciated. Speech is normal. No gait instability. Skin:  Skin is warm, dry and intpallor is noted Psychiatric: Mood and affect are normal. Speech and behavior are normal. Patient exhibits appropriate insight and judgment. ____________________________________________  EKG: Interpreted by me. Normal sinus rhythm with normal axis normal intervals. No  evidence of hypertrophy or acute infarction. Rate is 89 bpm  ____________________________________________  ED COURSE:  Pertinent labs & imaging results that were available during my care of the patient were reviewed by me and considered in my medical decision making (see chart for details).  patient will need  basic labs and head CT.  ____________________________________________    LABS (pertinent positives/negatives)  Labs Reviewed  CBC WITH DIFFERENTIAL/PLATELET - Abnormal; Notable for the following:    WBC 22.3 (*)    RBC 3.74 (*)    Hemoglobin 11.8 (*)    Neutro Abs 20.3 (*)    Lymphs Abs 0.8 (*)    Monocytes Absolute 1.0 (*)    All other components within normal limits  COMPREHENSIVE METABOLIC PANEL - Abnormal; Notable for the following:    Chloride 98 (*)    Glucose, Bld 120 (*)    BUN 22 (*)    AST 101 (*)    All other components within normal limits  TROPONIN I - Abnormal; Notable for the following:    Troponin I 0.19 (*)    All other components within normal limits  CULTURE, BLOOD (ROUTINE X 2)  CULTURE, BLOOD (ROUTINE X 2)  URINE CULTURE  URINALYSIS COMPLETEWITH MICROSCOPIC ([facility] ONLY)  CK    RADIOLOGY Images were viewed by me  CT head, chest x-ray FINDINGS: There is moderate central and cortical atrophy. Periventricular white matter changes are consistent with small vessel disease. There is no intra or extra-axial fluid collection or mass lesion. The basilar cisterns and ventricles have a normal appearance. There is no CT evidence for acute infarction or hemorrhage. No acute bone findings.  IMPRESSION: 1. Atrophy and small vessel disease. 2. No evidence for acute intracranial abnormality.  IMPRESSION: Left lower lobe atelectasis/ infiltrate. Possible pneumonia. Followup two-view chest x-ray may be helpful for further evaluation.  FINAL ASSESSMENT AND PLAN   weakness, fall, leukocytosis  Plan: Patient with labs and imaging as dictated above. Patient is being admitted for leukocytosis, weakness. Patient was given IV Levaquin to cover for both pneumonia or UTI. She is stable for admission at this time   Earleen Newport, MD   Earleen Newport, MD 02/06/15 1903

## 2015-02-06 NOTE — ED Notes (Signed)
MD notified troponin 0.19.

## 2015-02-06 NOTE — ED Notes (Signed)
BG 106

## 2015-02-06 NOTE — ED Notes (Signed)
Pt is a poor historian. Pt has very vague complaints of pain in arms, legs, back. Family members are concerned that she cant bear weight. Pt in nad at this time. And free of any other complaints

## 2015-02-06 NOTE — H&P (Signed)
[facility] NAME: Chelsea [name]    MR#:  [id number]  DATE OF BIRTH:  [birth year]  DATE OF ADMISSION:  02/06/2015  PRIMARY CARE PHYSICIAN: Dr. Lovie Macadamia REQUESTING/REFERRING PHYSICIAN: Dr. Jimmye Norman  CHIEF COMPLAINT:  Weakness and fall HISTORY OF PRESENT ILLNESS:  Chelsea [name]  is a 81 y.o. female with a known history of COPD and diabetes who presents with above complaint. Patient was found face down this afternoon by her daughter in between the dresser and nightstand. Patient reports that a prostate 5:30 AM she got up to use the restroom and apparently fell and was down until her daughter found her at approximately 11:50 AM. Patient was incoherent found in stool and urine and was very weak. EMS was then called to bring the patient to the emergency room. In the emergency room she was noted have elevated white blood cell count of 22 and elevated troponin. Patient denies chest pain, shortness of breath or urinary symptoms. Family reports that she has had these symptoms in the past when she has had a urinary infection. Her chest x-ray shows possible pneumonia versus atelectasis. She is started on Levaquin. There was no history of seizure. She did not bite her tongue.  PAST MEDICAL HISTORY:   Past Medical History  Diagnosis Date  . COPD (chronic obstructive pulmonary disease)   . Diabetes mellitus without complication     PAST SURGICAL HISTORY:   Past Surgical History  Procedure Laterality Date  . Abdominal hysterectomy      SOCIAL HISTORY:   Social History  Substance Use Topics  . Smoking status: Former Research scientist (life sciences)  . Smokeless tobacco: Not on file  . Alcohol Use: No    FAMILY HISTORY:  CAD and diabetes  DRUG ALLERGIES:   Allergies  Allergen Reactions  . Lyrica [Pregabalin]      REVIEW OF SYSTEMS:  CONSTITUTIONAL: No fever positive fatigue and weakness  EYES: No blurred or double vision.  EARS, NOSE, AND THROAT:  No tinnitus or ear pain.  RESPIRATORY: No cough, shortness of breath, wheezing or hemoptysis.  CARDIOVASCULAR: No chest pain, orthopnea, edema.  GASTROINTESTINAL: No nausea, vomiting, diarrhea or abdominal pain.  GENITOURINARY: No dysuria, hematuria.  ENDOCRINE: No polyuria, nocturia,  HEMATOLOGY: No anemia, easy bruising or bleeding SKIN: No rash or lesion. MUSCULOSKELETAL: No joint pain or arthritis.   NEUROLOGIC: No tingling, numbness, weakness.  PSYCHIATRY: Positive anxiety and depression.   MEDICATIONS AT HOME:  Metamucil 500 mg 4 times a day Prilosec 20 Miller times daily Citracal D 2 tablets daily Claritin 10 mg daily B6 50 daily DUONEBS every 4 hours daily PRN Advair 250/50 twice a day Seroquel 25 mg daily Losartan 100 mg daily Toprol 25 twice a day Neurontin 600 mg 1-1/2 tablets 4 times daily Metformin 500 twice a day Sulfasalazine 500 mg 2 tablets twice a day    VITAL SIGNS:  Blood pressure 149/75, pulse 91, temperature 98.5 F (36.9 C), temperature source Oral, resp. rate 20, height 5' 2"  (1.575 m), weight 74.844 kg (165 lb), SpO2 93 %.  PHYSICAL EXAMINATION:  GENERAL:  81 y.o.-year-old patient lying in the bed with no acute distress.  EYES: Pupils equal, round, reactive to light and accommodation. No scleral icterus. Extraocular muscles intact.  HEENT: Head atraumatic, normocephalic. Oropharynx and nasopharynx clear.  NECK:  Supple, no jugular venous distention. No thyroid enlargement, no tenderness.  LUNGS: Normal breath sounds bilaterally, no wheezing, rales,rhonchi or crepitation. No use of accessory muscles  of respiration.  CARDIOVASCULAR: S1, S2 normal. No murmurs, rubs, or gallops.  ABDOMEN: Soft, nontender, nondistended. Bowel sounds present. No organomegaly or mass.  EXTREMITIES: No pedal edema, cyanosis, or clubbing.  NEUROLOGIC: Cranial nerves II through XII are grossly intact. No focal deficits. PSYCHIATRIC: The patient is alert and oriented x 3.   SKIN: No obvious rash, lesion, or ulcer.   LABORATORY PANEL:   CBC  Recent Labs Lab 02/06/15 1703  WBC 22.3*  HGB 11.8*  HCT 35.6  PLT 310   ------------------------------------------------------------------------------------------------------------------  Chemistries   Recent Labs Lab 02/06/15 1703  NA 136  K 4.3  CL 98*  CO2 29  GLUCOSE 120*  BUN 22*  CREATININE 0.86  CALCIUM 9.6  AST 101*  ALT 34  ALKPHOS 57  BILITOT 0.4   ------------------------------------------------------------------------------------------------------------------  Cardiac Enzymes  Recent Labs Lab 02/06/15 1703  TROPONINI 0.19*   ------------------------------------------------------------------------------------------------------------------  RADIOLOGY:  Dg Chest 1 View  02/06/2015   CLINICAL DATA:  Weakness.  COPD and diabetes.  Fall today.  EXAM: CHEST  1 VIEW  COMPARISON:  07/16/2009  FINDINGS: COPD with hyperinflation. Patchy airspace disease left lower lobe may reflect pneumonia or atelectasis. Possible small left effusion. Right lung clear  Prominent heart size with vascular congestion.  Negative for edema.  IMPRESSION: Left lower lobe atelectasis/ infiltrate. Possible pneumonia. Followup two-view chest x-ray may be helpful for further evaluation.   Electronically Signed   By: Franchot Gallo M.D.   On: 02/06/2015 18:49   Ct Head Wo Contrast  02/06/2015   CLINICAL DATA:  Fall this morning in her bedroom. May have hit the right side of her face night on the stand. Weakness. Neuropathy. Unable to bear weight on legs at this time.  EXAM: CT HEAD WITHOUT CONTRAST  TECHNIQUE: Contiguous axial images were obtained from the base of the skull through the vertex without intravenous contrast.  COMPARISON:  09/15/2011  FINDINGS: There is moderate central and cortical atrophy. Periventricular white matter changes are consistent with small vessel disease. There is no intra or extra-axial fluid  collection or mass lesion. The basilar cisterns and ventricles have a normal appearance. There is no CT evidence for acute infarction or hemorrhage. No acute bone findings.  IMPRESSION: 1. Atrophy and small vessel disease. 2.  No evidence for acute intracranial abnormality.   Electronically Signed   By: Nolon Nations M.D.   On: 02/06/2015 16:00    EKG:  Normal sinus rhythm no ST elevation or depression  IMPRESSION AND PLAN:  81 year old female with a history of hypertension and diabetes who apparently slipped from her bed and was found face down by her family noted to have leukocytosis along with elevated troponin.  1. Elevated troponin: It does not appear the patient had a syncopal event or a neurological seizure-like event. Patient's troponin needs to be trended to assure she does not have acute bony syndrome. Her EKG shows no acute changes and she is not complaining of chest pain so the likelihood of acute coronary syndrome is low.  2. Leukocytosis: Urine analysis is pending at this time. Chest x-ray does show possible pneumonia versus atelectasis. Patient does not report a cough for fever giving the diagnosis of pneumonia less likely. However due to the elevation in white blood cell count and no urine analysis at this time I will start empiric Levaquin. I've also start incentive spirometer. It should be noted that she was 80% on room air. Chest x-ray for the a.m. I will follow up on  urine analysis.  3. Diabetes: Patient will continue metformin, sliding scale insulin and ADA diet.  4. Essential hypertension: Continue metoprolol and losartan.  5. COPD: Patient does not appear to be in acute exacerbation at this time. Continue inhalers.  6. Weakness: I will consult physical therapy.     All the records are reviewed and case discussed with ED provider. Management plans discussed with the patient and she is in agreement.  CODE STATUS:DNR  TOTAL TIME TAKING CARE OF THIS PATIENT: 45  minutes.    Juanda Luba M.D on 02/06/2015 at 7:20 PM  Between 7am to 6pm - Pager - [phone] After 6pm go to [email or web address] - password [email or web address] [facility]  [facility]  Office  [phone]  CC: Primary care physician; No primary care provider on file.

## 2015-02-06 NOTE — ED Notes (Signed)
2L [place] applied per hospitalist verbal request.

## 2015-02-07 ENCOUNTER — Inpatient Hospital Stay: Payer: Medicare Other

## 2015-02-07 ENCOUNTER — Encounter: Payer: Self-pay | Admitting: *Deleted

## 2015-02-07 LAB — CBC
HCT: 33.3 % — ABNORMAL LOW (ref 35.0–47.0)
HEMOGLOBIN: 11.2 g/dL — AB (ref 12.0–16.0)
MCH: 32.6 pg (ref 26.0–34.0)
MCHC: 33.8 g/dL (ref 32.0–36.0)
MCV: 96.6 fL (ref 80.0–100.0)
Platelets: 267 10*3/uL (ref 150–440)
RBC: 3.45 MIL/uL — AB (ref 3.80–5.20)
RDW: 13.4 % (ref 11.5–14.5)
WBC: 14.6 10*3/uL — ABNORMAL HIGH (ref 3.6–11.0)

## 2015-02-07 LAB — BASIC METABOLIC PANEL
ANION GAP: 8 (ref 5–15)
BUN: 16 mg/dL (ref 6–20)
CALCIUM: 9.2 mg/dL (ref 8.9–10.3)
CO2: 27 mmol/L (ref 22–32)
Chloride: 103 mmol/L (ref 101–111)
Creatinine, Ser: 0.68 mg/dL (ref 0.44–1.00)
Glucose, Bld: 116 mg/dL — ABNORMAL HIGH (ref 65–99)
Potassium: 3.7 mmol/L (ref 3.5–5.1)
Sodium: 138 mmol/L (ref 135–145)

## 2015-02-07 LAB — TROPONIN I
Troponin I: 0.36 ng/mL — ABNORMAL HIGH (ref <0.031)
Troponin I: 0.35 ng/mL — ABNORMAL HIGH (ref <0.031)
Troponin I: 0.47 ng/mL — ABNORMAL HIGH (ref <0.031)

## 2015-02-07 LAB — GLUCOSE, CAPILLARY
GLUCOSE-CAPILLARY: 106 mg/dL — AB (ref 65–99)
GLUCOSE-CAPILLARY: 127 mg/dL — AB (ref 65–99)
Glucose-Capillary: 122 mg/dL — ABNORMAL HIGH (ref 65–99)
Glucose-Capillary: 111 mg/dL — ABNORMAL HIGH (ref 65–99)

## 2015-02-07 MED ORDER — LEVOFLOXACIN 500 MG PO TABS: 500.0000 mg | ORAL_TABLET | Freq: Every day | ORAL | Status: DC

## 2015-02-07 MED ORDER — LEVOFLOXACIN 500 MG PO TABS
500.0000 mg | ORAL_TABLET | Freq: Every day | ORAL | Status: DC
Start: 2015-02-07 — End: 2015-02-07

## 2015-02-07 NOTE — Care Management Note (Signed)
Case Management Note  Patient Details  Name: Chelsea [name] MRN: [id number] Date of Birth: 1930/10/24  Subjective/Objective:    Chelsea [name] did oxygen progressions today and thus far Chelsea [name] does not qualify for continuous home oxygen. Chelsea [name] reports that she currently only uses oxygen at night at her residence.                 Action/Plan:   Expected Discharge Date:                  Expected Discharge Plan:     In-House Referral:     Discharge planning Services     Post Acute Care Choice:    Choice offered to:     DME Arranged:    DME Agency:     HH Arranged:    [place] Agency:     Status of Service:     Medicare Important Message Given:    Date Medicare IM Given:    Medicare IM give by:    Date Additional Medicare IM Given:    Additional Medicare Important Message give by:     If discussed at [place] of Stay Meetings, dates discussed:    Additional Comments:  [name], [name] 02/07/2015, 1:54 PM

## 2015-02-07 NOTE — Care Management Note (Signed)
Case Management Note  Patient Details  Name: Addelyn Alleman MRN: [id number] Date of Birth: 18-Feb-1931  Subjective/Objective:      [place] RN agreed to post the oxygen progression numbers that she obtained from Ms Petrolia in the computer. Dr Vianne Bulls agreed to order home health for Ms Garmany who chose [facility] to be her home health services provider. Currently awaiting Dr Vianne Bulls to post home health orders. Ms Delcid currently has night oxygen 2L N/C supplied by [facility].               Action/Plan:   Expected Discharge Date:                  Expected Discharge Plan:     In-House Referral:     Discharge planning Services     Post Acute Care Choice:    Choice offered to:     DME Arranged:    DME Agency:     HH Arranged:    [place] Agency:     Status of Service:     Medicare Important Message Given:    Date Medicare IM Given:    Medicare IM give by:    Date Additional Medicare IM Given:    Additional Medicare Important Message give by:     If discussed at [place] of Stay Meetings, dates discussed:    Additional Comments:  Varie Machamer A, RN 02/07/2015, 4:22 PM

## 2015-02-07 NOTE — Progress Notes (Signed)
PT Review Note  Patient Details Name: Chelsea [name] MRN: [id number] DOB: January 04, 1931   Cancelled Treatment:     Order received and chart reviewed. Order states PT start time of 02/07/15 at 1928. Will attempt PT evaluation after that time as appropriate.  Lyndel Safe Toryn Mcclinton PT, DPT   Kamaljit Hizer 02/07/2015, 9:30 AM

## 2015-02-07 NOTE — Evaluation (Signed)
Physical Therapy Evaluation Patient Details Name: Keirstyn Aydt MRN: [id number] DOB: 1930-10-01 Today's Date: 02/07/2015   History of Present Illness  Lenice Reddinger is a 81 y.o. female with a known history of COPD and diabetes who presents with above complaint. Patient was found face down this afternoon by her daughter in between the dresser and nightstand. Patient reports that approximately 5:30 AM she got up to use the restroom and apparently fell and was down until her daughter found her at approximately 11:50 AM. Patient was incoherent found in stool and urine and was very weak. EMS was then called to bring the patient to the emergency room. In the emergency room she was noted have elevated white blood cell count of 22 and elevated troponin. Patient denies chest pain, shortness of breath or urinary symptoms. Family reports that she has had these symptoms in the past when she has had a urinary infection. Her chest x-ray shows possible pneumonia versus atelectasis. She is started on Levaquin. There was no history of seizure. She did not bite her tongue. Pt reports limited household ambulation prior to admission. She lives on the second floor of her daughter's home and states she can navigate 13 stairs between main floor and bedroom. She endorses a history of at least 12 falls over the last 12 month. Pt is adamant about returning home at discharge.   Clinical Impression  Pt demonstrates considerable weakness and very poor balance placing her at high risk for future falls and readmission. Pt states she does not have 24/7 assistance at home. She requires modA for transfers and a few small steps at EOB. However she is severely unstable and is unsafe to ambulate further at this time. Pt has 13 steps to her bedroom at home. Pt adamantly refuses SNF placement. If she returns home she will need HH PT and 24/7 assistance. Pt will benefit from skilled PT services to address deficits in strength, balance, and  mobility in order to return to full function at home.     Follow Up Recommendations SNF (Currently refuses, please arrange [facility] PT and 24/7 assist)    Equipment Recommendations  None recommended by PT    Recommendations for Other Services       Precautions / Restrictions Precautions Precautions: Fall Restrictions Weight Bearing Restrictions: No      Mobility  Bed Mobility Overal bed mobility: Needs Assistance Bed Mobility: Supine to Sit;Sit to Supine     Supine to sit: Min assist Sit to supine: Min assist   General bed mobility comments: Poor UE strength, and difficulty going from sidelying to sitting  Transfers Overall transfer level: Needs assistance Equipment used: Rolling walker (2 wheeled) Transfers: Sit to/from Stand Sit to Stand: Mod assist         General transfer comment: Pt requires modA+1 assist to go from sidelying to sitting. Poor strength and sequencing. Once pt comes to standing she requires bed support on back of knees to keep from falling. Poor standing balance  Ambulation/Gait Ambulation/Gait assistance: Mod assist Ambulation Distance (Feet): 2 Feet Assistive device: Rolling walker (2 wheeled) Gait Pattern/deviations: Decreased step length - right;Decreased step length - left     General Gait Details: Pt takes small steps forward and backward at EOB. Pt is very unstable and leaning into therapist to prevent her from falling backwards. SaO2 remains at or above 90% on room air during all exertion. Pt denies SOB throughout session.  Stairs  Wheelchair Mobility    Modified Rankin (Stroke Patients Only)       Balance Overall balance assessment: Needs assistance   Sitting balance-Leahy Scale: Fair       Standing balance-Leahy Scale: Poor                               Pertinent Vitals/Pain Pain Assessment: No/denies pain    Home Living Family/patient expects to be discharged to:: Private residence Living  Arrangements: Children Available Help at Discharge: Family (Pt does not have 24/7 assist at home) Type of Home: House Home Access: Level entry     Home Layout: Multi-level Home Equipment: Walker - 2 wheels;Wheelchair - manual;Cane - single point      Prior Function Level of Independence: Independent               Hand Dominance        Extremity/Trunk Assessment   Upper Extremity Assessment: Overall WFL for tasks assessed           Lower Extremity Assessment: Overall WFL for tasks assessed         Communication   Communication: No difficulties  Cognition Arousal/Alertness: Awake/alert Behavior During Therapy: WFL for tasks assessed/performed Overall Cognitive Status: Within Functional Limits for tasks assessed                      General Comments      Exercises        Assessment/Plan    PT Assessment Patient needs continued PT services  PT Diagnosis Difficulty walking;Generalized weakness   PT Problem List Decreased strength;Decreased activity tolerance;Decreased balance;Decreased safety awareness  PT Treatment Interventions DME instruction;Stair training;Gait training;Functional mobility training;Therapeutic activities;Therapeutic exercise;Balance training;Neuromuscular re-education;Patient/family education   PT Goals (Current goals can be found in the Care Plan section) Acute Rehab PT Goals Patient Stated Goal: "I'm going home" PT Goal Formulation: With patient Time For Goal Achievement: 02/21/15 Potential to Achieve Goals: Good    Frequency Min 2X/week   Barriers to discharge        Co-evaluation               End of Session Equipment Utilized During Treatment: Gait belt Activity Tolerance: Patient tolerated treatment well Patient left: in bed;with call bell/phone within reach;with bed alarm set Nurse Communication: Mobility status         Time: 2482-5003 PT Time Calculation (min) (ACUTE ONLY): 21 min   Charges:    PT Evaluation $Initial PT Evaluation Tier I: 1 Procedure     PT G Codes:       Lyndel Safe Eiza Canniff PT, DPT   Siya Flurry 02/07/2015, 1:22 PM

## 2015-02-07 NOTE — Progress Notes (Signed)
Pt is a&o with periods of confusion, VSS, NSR on tele with no complaints of pain or discomfort. PT eval and recommended SNF placement but pt refused. Orders for home health RN and PT placed and set up by CM. Pt weaned to room air and did not qualify for continuous home O2. Pt to be discharged to home. Discharge instructions reviewed with pt and family with verbal acknowledgment of understanding. Prescription for abx e-submitted to pharmacy. IV, tele removed and to be escorted off unit via wheelchair by nursing.

## 2015-02-07 NOTE — Care Management Note (Signed)
Case Management Note  Patient Details  Name: Chelsea [name] MRN: [id number] Date of Birth: 1930/11/16  Subjective/Objective:     No Discharge Summary available at time of referral to advanced Home Health.                Action/Plan:   Expected Discharge Date:                  Expected Discharge Plan:     In-House Referral:     Discharge planning Services     Post Acute Care Choice:    Choice offered to:     DME Arranged:    DME Agency:     HH Arranged:    [place] Agency:     Status of Service:     Medicare Important Message Given:    Date Medicare IM Given:    Medicare IM give by:    Date Additional Medicare IM Given:    Additional Medicare Important Message give by:     If discussed at [place] of Stay Meetings, dates discussed:    Additional Comments:  Jaison Petraglia A, RN 02/07/2015, 5:06 PM

## 2015-02-07 NOTE — Progress Notes (Signed)
Pt's skin is warm and dry to touch. Ecchymosis to bilateral knees and small laceration to right shin noted. Verified by Lattie Haw, RN.

## 2015-02-07 NOTE — Clinical Social Work Note (Signed)
Clinical Social Work Assessment  Patient Details  Name: Chelsea [name] MRN: [id number] Date of Birth: 11-28-1930  Date of referral:  02/07/15               Reason for consult:  Facility Placement                Permission sought to share information with:  Chartered certified accountant granted to share information::  Yes, Verbal Permission Granted  Name::      Chelsea [name]::   [place]   Relationship::     Contact Information:     Housing/Transportation Living arrangements for the past 2 months:  [place] of Information:  Patient, Power of [place], Adult Children Patient Interpreter Needed:  None Criminal Activity/Legal Involvement Pertinent to Current Situation/Hospitalization:  No - Comment as needed Significant Relationships:  Adult Children Lives with:  Adult Children Do you feel safe going back to the place where you live?  Yes Need for family participation in patient care:  Yes (Comment)   Care giving concerns: Patient lives with her daughter Chelsea [name] [phone] in [place].   Social Worker assessment / plan: Holiday representative (CSW) received SNF consult. CSW met with patient and her daughter Chelsea [name] was at bedside. Patient reported that she lives with her daughter and son in law in [place]. Patient is originally from [place]. CSW introduced self and explained role of CSW department. Patient adamantly refused SNF. Daughter attempted to talk patient into SNF however patient continued to refuse. Patient agreed to let CSW faxed out FL2 in case she gets home and changes her mind. Patient chose [facility] for home health. CSW explained that it will be difficult to place patient from home to rehab. Patient verbalized her understanding. RN Case Manager and MD aware of above. Please reconsult if future social work needs arise. CSW signing off.   FL2 complete and faxed out.   Employment status:  Disabled (Comment on whether  or not currently receiving Disability), Retired Nurse, adult PT Recommendations:  [place], [place] / Referral to community resources:  [place]  Patient/Family's Response to care: Patient adamantly refused SNF.   Patient/Family's Understanding of and Emotional Response to Diagnosis, Current Treatment, and Prognosis: Patient and daughter thanked CSW for assistance.   Emotional Assessment Appearance:  Appears stated age Attitude/Demeanor/Rapport:    Affect (typically observed):  Pleasant Orientation:  Oriented to Self, Oriented to Place, Oriented to  Time, Oriented to Situation Alcohol / Substance use:  Not Applicable Psych involvement (Current and /or in the community):  No (Comment)  Discharge Needs  Concerns to be addressed:  Discharge Planning Concerns Readmission within the last 30 days:  No Current discharge risk:  Dependent with Mobility Barriers to Discharge:  No Barriers Identified   Loralyn Freshwater, LCSW 02/07/2015, 5:45 PM

## 2015-02-08 LAB — URINE CULTURE
Culture: NO GROWTH
SPECIAL REQUESTS: NORMAL

## 2015-02-10 NOTE — Discharge Summary (Signed)
Chelsea [name], is a 81 y.o. female  DOB 05/14/1931  MRN [id number].  Admission date:  02/06/2015  Admitting Physician  Bettey Costa, MD  Discharge Date:  02/10/2015   Primary MD  Juluis Pitch, MD  Recommendations for primary care physician for things to follow:   Follow up with PMD in one week   Admission Diagnosis  Leukocytosis [D72.829] Weakness [R53.1] Fall, initial encounter [W19.XXXA]   Discharge Diagnosis  Leukocytosis [D72.829] Weakness [R53.1] Fall, initial encounter [W19.XXXA]   Active Problems:   Sepsis      Past Medical History  Diagnosis Date  . COPD (chronic obstructive pulmonary disease)   . Diabetes mellitus without complication     Past Surgical History  Procedure Laterality Date  . Abdominal hysterectomy         History of present illness and  Hospital Course:     Kindly see H&P for history of present illness and admission details, please review complete Labs, Consult reports and Test reports for all details in brief  HPI  from the history and physical done on the day of admission  81 year old off female with history of COPD, diabetes admitted for fall, found to have pneumonia. Patient admitted to hospitalist service on telemetry for possible syncopal event.  Hospital Course  1. elevated troponin slightly secondary to fall, rather than coronary syndrome. Patient never had chest pain. #2 syncope likely vasovagal. Head CT unremarkable. #3 community-acquired pneumonia; chest x-ray on admission showed left lower lobe infiltrate. or an white count 22.3 on admission decreased to 14.6 on September 4. Discharge home with [place].  #4 diabetes mellitus type 2 continued on home medications. 5.Deconditioning physical therapy saw the patient and recommended rehabilitation but patient refused to  go,we  have arranged home health physical therapy. If physical therapy does not work for her at home she may need placement. #6 history of COPD: No wheezing at this time continued on home medications. 7.Hypertension continue metoprolol, losartan.  Discharge Condition:stable   Follow UP  Follow-up Information    Follow up with Lovie Macadamia, DAVID, MD In 1 week.   Specialty:  Family Medicine   Contact information:   [place] [address] [phone]         Discharge Instructions  and  Discharge Medications     Discharge Instructions    Face-to-face encounter (required for Medicare/Medicaid patients)    Complete by:  As directed   I Narya Beavin certify that this patient is under my care and that I, or a nurse practitioner or physician's assistant working with me, had a face-to-face encounter that meets the physician face-to-face encounter requirements with this patient on 02/07/2015. The encounter with the patient was in whole, or in part for the following medical condition(s) which is the primary reason for home health care  Pneumonia htn Pneumonia Chronic respiratory failure  The encounter with the patient was in whole, or in part, for the following medical condition, which is the primary reason for home health care:  full  I certify that, based on my findings, the following services are medically necessary home health services:   Physical therapy Nursing    Reason for Medically Necessary Home Health Services:  Therapy- Therapeutic Exercises to Increase Strength and Endurance  My clinical findings support the need for the above services:  Unable to leave home safely without assistance and/or assistive device  Further, I certify that my clinical findings support that this patient is homebound due to:  Ambulates short  distances less than 300 feet     Home Health    Complete by:  As directed   To provide the following care/treatments:   PT RN               Medication List    TAKE these medications        calcium citrate-vitamin D 315-200 MG-UNIT per tablet  Commonly known as:  CITRACAL+D  Take 1 tablet by mouth 2 (two) times daily.     diphenhydrAMINE 25 MG tablet  Commonly known as:  SOMINEX  Take 50 mg by mouth at bedtime.     etodolac 400 MG tablet  Commonly known as:  LODINE  Take 400 mg by mouth 2 (two) times daily.     Fluticasone-Salmeterol 250-50 MCG/DOSE Aepb  Commonly known as:  ADVAIR  Inhale 1 puff into the lungs 2 (two) times daily.     gabapentin 600 MG tablet  Commonly known as:  NEURONTIN  Take 900 mg by mouth 4 (four) times daily.     levofloxacin 500 MG tablet  Commonly known as:  LEVAQUIN  Take 1 tablet (500 mg total) by mouth daily.     loratadine 10 MG tablet  Commonly known as:  CLARITIN  Take 10 mg by mouth every morning.     losartan 100 MG tablet  Commonly known as:  COZAAR  Take 100 mg by mouth daily.     metFORMIN 500 MG tablet  Commonly known as:  GLUCOPHAGE  Take by mouth 2 (two) times daily with a meal.     metoprolol tartrate 25 MG tablet  Commonly known as:  LOPRESSOR  Take 25 mg by mouth 2 (two) times daily.     omeprazole 20 MG tablet  Commonly known as:  PRILOSEC OTC  Take 20 mg by mouth every morning.     psyllium 0.52 G capsule  Commonly known as:  REGULOID  Take 4 capsules by mouth 2 (two) times daily.     pyridOXINE 50 MG tablet  Commonly known as:  VITAMIN B-6  Take 50 mg by mouth daily.     QUEtiapine 25 MG tablet  Commonly known as:  SEROQUEL  Take 25 mg by mouth at bedtime.     sulfaSALAzine 500 MG tablet  Commonly known as:  AZULFIDINE  Take 1,000 mg by mouth 2 (two) times daily.          Diet and Activity recommendation: See Discharge Instructions above   Consults obtained - PT   Major procedures and Radiology Reports - PLEASE review detailed and final reports for all details, in brief -     Dg Chest 1 View  02/07/2015   CLINICAL DATA:   Followup pneumonia.  EXAM: CHEST  1 VIEW  COMPARISON:  02/06/2015.  FINDINGS: Normal sized heart with an interval decrease in size. Decrease prominence of the pulmonary vasculature stable prominence of the interstitial markings. No pleural fluid. Unremarkable bones.  IMPRESSION: 1. Mildly improved cardiomegaly and pulmonary vascular congestion. 2. Stable chronic interstitial lung disease.   Electronically Signed   By: Claudie Revering M.D.   On: 02/07/2015 09:45   Dg Chest 1 View  02/06/2015   CLINICAL DATA:  Weakness.  COPD and diabetes.  Fall today.  EXAM: CHEST  1 VIEW  COMPARISON:  07/16/2009  FINDINGS: COPD with hyperinflation. Patchy airspace disease left lower lobe may reflect pneumonia or atelectasis. Possible small left effusion. Right lung clear  Prominent heart size with vascular  congestion.  Negative for edema.  IMPRESSION: Left lower lobe atelectasis/ infiltrate. Possible pneumonia. Followup two-view chest x-ray may be helpful for further evaluation.   Electronically Signed   By: Franchot Gallo M.D.   On: 02/06/2015 18:49   Ct Head Wo Contrast  02/06/2015   CLINICAL DATA:  Fall this morning in her bedroom. May have hit the right side of her face night on the stand. Weakness. Neuropathy. Unable to bear weight on legs at this time.  EXAM: CT HEAD WITHOUT CONTRAST  TECHNIQUE: Contiguous axial images were obtained from the base of the skull through the vertex without intravenous contrast.  COMPARISON:  09/15/2011  FINDINGS: There is moderate central and cortical atrophy. Periventricular white matter changes are consistent with small vessel disease. There is no intra or extra-axial fluid collection or mass lesion. The basilar cisterns and ventricles have a normal appearance. There is no CT evidence for acute infarction or hemorrhage. No acute bone findings.  IMPRESSION: 1. Atrophy and small vessel disease. 2.  No evidence for acute intracranial abnormality.   Electronically Signed   By: Nolon Nations M.D.    On: 02/06/2015 16:00    Micro Results  Recent Results (from the past 240 hour(s))  Blood culture (routine x 2)     Status: None (Preliminary result)   Collection Time: 02/06/15  7:00 PM  Result Value Ref Range Status   Specimen Description BLOOD RIGHT ASSIST CONTROL  Final   Special Requests BOTTLES DRAWN AEROBIC AND ANAEROBIC 2CC  Final   Culture NO GROWTH 4 DAYS  Final   Report Status PENDING  Incomplete  Blood culture (routine x 2)     Status: None (Preliminary result)   Collection Time: 02/06/15  7:00 PM  Result Value Ref Range Status   Specimen Description BLOOD LEFT ASSIST CONTROL  Final   Special Requests BOTTLES DRAWN AEROBIC AND ANAEROBIC 5CC  Final   Culture NO GROWTH 4 DAYS  Final   Report Status PENDING  Incomplete  Urine culture     Status: None   Collection Time: 02/06/15  7:25 PM  Result Value Ref Range Status   Specimen Description URINE, RANDOM  Final   Special Requests Normal  Final   Culture NO GROWTH 1 DAY  Final   Report Status 02/08/2015 FINAL  Final  Culture, blood (routine x 2)     Status: None (Preliminary result)   Collection Time: 02/06/15 10:35 PM  Result Value Ref Range Status   Specimen Description BLOOD LEFT ASSIST CONTROL  Final   Special Requests BOTTLES DRAWN AEROBIC AND ANAEROBIC 2CC  Final   Culture NO GROWTH 4 DAYS  Final   Report Status PENDING  Incomplete  Culture, blood (routine x 2)     Status: None (Preliminary result)   Collection Time: 02/06/15 10:44 PM  Result Value Ref Range Status   Specimen Description BLOOD RIGHT ASSIST CONTROL  Final   Special Requests BOTTLES DRAWN AEROBIC AND ANAEROBIC 3CC  Final   Culture NO GROWTH 4 DAYS  Final   Report Status PENDING  Incomplete       Today   Subjective:   Tayley Bautch today has no headache,no chest abdominal pain,no new weakness tingling or numbness, feels much better wants to go home today.   Objective:   Blood pressure 143/59, pulse 78, temperature 98.2 F (36.8 C),  temperature source Oral, resp. rate 16, height 5' 2"  (1.575 m), weight 75.297 kg (166 lb), SpO2 94 %.  No intake  or output data in the 24 hours ending 02/10/15 1232  Exam Awake Alert, Oriented x 3, No new F.N deficits, Normal affect [place].AT,PERRAL Supple Neck,No JVD, No cervical lymphadenopathy appriciated.  Symmetrical Chest wall movement, Good air movement bilaterally, CTAB RRR,No Gallops,Rubs or new Murmurs, No Parasternal Heave +ve B.Sounds, Abd Soft, Non tender, No organomegaly appriciated, No rebound -guarding or rigidity. No Cyanosis, Clubbing or edema, No new Rash or bruise  Data Review   CBC w Diff:  Lab Results  Component Value Date   WBC 14.6* 02/07/2015   WBC 9.5 02/19/2012   HGB 11.2* 02/07/2015   HGB 13.3 02/19/2012   HCT 33.3* 02/07/2015   HCT 38.4 02/19/2012   PLT 267 02/07/2015   PLT 262 02/19/2012   LYMPHOPCT 4 02/06/2015   LYMPHOPCT 24.1 02/19/2012   MONOPCT 5 02/06/2015   MONOPCT 8.4 02/19/2012   EOSPCT 0 02/06/2015   EOSPCT 3.5 02/19/2012   BASOPCT 0 02/06/2015   BASOPCT 0.4 02/19/2012    CMP:  Lab Results  Component Value Date   NA 138 02/07/2015   NA 143 02/20/2012   K 3.7 02/07/2015   K 3.2* 02/20/2012   CL 103 02/07/2015   CL 107 02/20/2012   CO2 27 02/07/2015   CO2 26 02/20/2012   BUN 16 02/07/2015   BUN 11 02/20/2012   CREATININE 0.68 02/07/2015   CREATININE 0.82 02/20/2012   PROT 7.1 02/06/2015   PROT 8.4* 02/18/2012   ALBUMIN 4.0 02/06/2015   ALBUMIN 4.0 02/18/2012   BILITOT 0.4 02/06/2015   BILITOT 0.7 02/18/2012   ALKPHOS 57 02/06/2015   ALKPHOS 129 02/18/2012   AST 101* 02/06/2015   AST 33 02/18/2012   ALT 34 02/06/2015   ALT 46 02/18/2012  .   Total Time in preparing paper work, data evaluation and todays exam - 40 minutes  Viridiana Spaid M.D on 02/10/2015 at 12:32 PM

## 2015-02-11 LAB — CULTURE, BLOOD (ROUTINE X 2)
CULTURE: NO GROWTH
Culture: NO GROWTH
Culture: NO GROWTH
Culture: NO GROWTH

## 2016-05-21 ENCOUNTER — Encounter: Payer: Self-pay | Admitting: Emergency Medicine

## 2016-05-21 ENCOUNTER — Observation Stay
Admission: EM | Admit: 2016-05-21 | Discharge: 2016-05-23 | Disposition: A | Discharge status: Skilled Nursing Facility | Payer: Medicare Other | Attending: Internal Medicine | Admitting: Internal Medicine

## 2016-05-21 ENCOUNTER — Emergency Department: Payer: Medicare Other

## 2016-05-21 DIAGNOSIS — Z794 Long term (current) use of insulin: Secondary | ICD-10-CM | POA: Insufficient documentation

## 2016-05-21 DIAGNOSIS — Z87891 Personal history of nicotine dependence: Secondary | ICD-10-CM | POA: Diagnosis not present

## 2016-05-21 DIAGNOSIS — R5381 Other malaise: Secondary | ICD-10-CM | POA: Diagnosis present

## 2016-05-21 DIAGNOSIS — I10 Essential (primary) hypertension: Secondary | ICD-10-CM | POA: Insufficient documentation

## 2016-05-21 DIAGNOSIS — E119 Type 2 diabetes mellitus without complications: Secondary | ICD-10-CM

## 2016-05-21 DIAGNOSIS — I493 Ventricular premature depolarization: Secondary | ICD-10-CM | POA: Insufficient documentation

## 2016-05-21 DIAGNOSIS — Z833 Family history of diabetes mellitus: Secondary | ICD-10-CM | POA: Diagnosis not present

## 2016-05-21 DIAGNOSIS — K219 Gastro-esophageal reflux disease without esophagitis: Secondary | ICD-10-CM | POA: Insufficient documentation

## 2016-05-21 DIAGNOSIS — J181 Lobar pneumonia, unspecified organism: Secondary | ICD-10-CM

## 2016-05-21 DIAGNOSIS — I700 Atherosclerosis of aorta: Secondary | ICD-10-CM | POA: Insufficient documentation

## 2016-05-21 DIAGNOSIS — J189 Pneumonia, unspecified organism: Secondary | ICD-10-CM

## 2016-05-21 DIAGNOSIS — R0902 Hypoxemia: Secondary | ICD-10-CM | POA: Insufficient documentation

## 2016-05-21 DIAGNOSIS — M6281 Muscle weakness (generalized): Secondary | ICD-10-CM

## 2016-05-21 DIAGNOSIS — R296 Repeated falls: Secondary | ICD-10-CM | POA: Diagnosis not present

## 2016-05-21 DIAGNOSIS — Z9981 Dependence on supplemental oxygen: Secondary | ICD-10-CM | POA: Diagnosis not present

## 2016-05-21 DIAGNOSIS — Z79899 Other long term (current) drug therapy: Secondary | ICD-10-CM | POA: Insufficient documentation

## 2016-05-21 DIAGNOSIS — Z888 Allergy status to other drugs, medicaments and biological substances status: Secondary | ICD-10-CM | POA: Diagnosis not present

## 2016-05-21 DIAGNOSIS — Z800 Family history of malignant neoplasm of digestive organs: Secondary | ICD-10-CM | POA: Diagnosis not present

## 2016-05-21 DIAGNOSIS — J449 Chronic obstructive pulmonary disease, unspecified: Secondary | ICD-10-CM | POA: Diagnosis present

## 2016-05-21 DIAGNOSIS — R262 Difficulty in walking, not elsewhere classified: Secondary | ICD-10-CM

## 2016-05-21 DIAGNOSIS — J441 Chronic obstructive pulmonary disease with (acute) exacerbation: Principal | ICD-10-CM | POA: Insufficient documentation

## 2016-05-21 LAB — CBC WITH DIFFERENTIAL/PLATELET
Basophils Absolute: 0.1 10*3/uL (ref 0–0.1)
Basophils Relative: 1 %
Eosinophils Absolute: 0.2 10*3/uL (ref 0–0.7)
Eosinophils Relative: 2 %
HEMATOCRIT: 37 % (ref 35.0–47.0)
HEMOGLOBIN: 12.6 g/dL (ref 12.0–16.0)
LYMPHS ABS: 1.6 10*3/uL (ref 1.0–3.6)
Lymphocytes Relative: 13 %
MCH: 32.6 pg (ref 26.0–34.0)
MCHC: 34.1 g/dL (ref 32.0–36.0)
MCV: 95.4 fL (ref 80.0–100.0)
MONO ABS: 1.3 10*3/uL — AB (ref 0.2–0.9)
MONOS PCT: 11 %
NEUTROS ABS: 8.5 10*3/uL — AB (ref 1.4–6.5)
Neutrophils Relative %: 73 %
Platelets: 284 10*3/uL (ref 150–440)
RBC: 3.88 MIL/uL (ref 3.80–5.20)
RDW: 13.1 % (ref 11.5–14.5)
WBC: 11.7 10*3/uL — AB (ref 3.6–11.0)

## 2016-05-21 LAB — COMPREHENSIVE METABOLIC PANEL
ALK PHOS: 60 U/L (ref 38–126)
ALT: 13 U/L — ABNORMAL LOW (ref 14–54)
ANION GAP: 8 (ref 5–15)
AST: 25 U/L (ref 15–41)
Albumin: 4 g/dL (ref 3.5–5.0)
BILIRUBIN TOTAL: 0.5 mg/dL (ref 0.3–1.2)
BUN: 13 mg/dL (ref 6–20)
CALCIUM: 9.7 mg/dL (ref 8.9–10.3)
CO2: 29 mmol/L (ref 22–32)
Chloride: 97 mmol/L — ABNORMAL LOW (ref 101–111)
Creatinine, Ser: 0.77 mg/dL (ref 0.44–1.00)
Glucose, Bld: 129 mg/dL — ABNORMAL HIGH (ref 65–99)
Potassium: 3.9 mmol/L (ref 3.5–5.1)
Sodium: 134 mmol/L — ABNORMAL LOW (ref 135–145)
TOTAL PROTEIN: 7.4 g/dL (ref 6.5–8.1)

## 2016-05-21 LAB — URINALYSIS, ROUTINE W REFLEX MICROSCOPIC
BACTERIA UA: NONE SEEN
Bilirubin Urine: NEGATIVE
GLUCOSE, UA: NEGATIVE mg/dL
HGB URINE DIPSTICK: NEGATIVE
Ketones, ur: NEGATIVE mg/dL
NITRITE: NEGATIVE
PH: 7 (ref 5.0–8.0)
Protein, ur: 100 mg/dL — AB
SPECIFIC GRAVITY, URINE: 1.012 (ref 1.005–1.030)

## 2016-05-21 LAB — TROPONIN I

## 2016-05-21 MED ORDER — IPRATROPIUM-ALBUTEROL 0.5-2.5 (3) MG/3ML IN SOLN
3.0000 mL | Freq: Once | RESPIRATORY_TRACT | Status: AC
  Administered 2016-05-21: 3 mL via RESPIRATORY_TRACT
  Filled 2016-05-21: qty 3

## 2016-05-21 MED ORDER — LEVOFLOXACIN 750 MG PO TABS
750.0000 mg | ORAL_TABLET | Freq: Once | ORAL | Status: AC
  Administered 2016-05-21: 750 mg via ORAL
  Filled 2016-05-21: qty 1

## 2016-05-21 MED ORDER — PREDNISONE 20 MG PO TABS
40.0000 mg | ORAL_TABLET | Freq: Once | ORAL | Status: AC
  Administered 2016-05-21: 40 mg via ORAL
  Filled 2016-05-21: qty 2

## 2016-05-21 NOTE — H&P (Signed)
[facility] NAME: Chelsea [name]    MR#:  [id number]  DATE OF BIRTH:  September 10, 1930  DATE OF ADMISSION:  05/21/2016  PRIMARY CARE PHYSICIAN: Juluis Pitch, MD   REQUESTING/REFERRING PHYSICIAN: Jacqualine Code, MD  CHIEF COMPLAINT:   Chief Complaint  Patient presents with  . Fall  . Cough    HISTORY OF PRESENT ILLNESS:  Chelsea [name]  is a 82 y.o. female who presents with Cough, congestion, increased shortness of breath, multiple falls. Patient states that she has been having increasing falls over the last week or so. Over the same time. She's had some upper respiratory symptoms, increased cough with sputum production. Patient has a history of COPD. Patient and/or stated that her baseline oxygen level is usually between 88 and 92%, and that she uses 3 L of oxygen at bedtime only. She is also been falling frequently, and reports fair amount of deconditioning. Hospitalists were called for admission  PAST MEDICAL HISTORY:   Past Medical History:  Diagnosis Date  . COPD (chronic obstructive pulmonary disease) ([place])   . Diabetes mellitus without complication ([place])     PAST SURGICAL HISTORY:   Past Surgical History:  Procedure Laterality Date  . ABDOMINAL HYSTERECTOMY      SOCIAL HISTORY:   Social History  Substance Use Topics  . Smoking status: Former Research scientist (life sciences)  . Smokeless tobacco: Never Used  . Alcohol use No    FAMILY HISTORY:   Family History  Problem Relation Age of Onset  . Diabetes Mother   . Liver cancer Father     DRUG ALLERGIES:   Allergies  Allergen Reactions  . Lyrica [Pregabalin] Swelling    MEDICATIONS AT HOME:   Prior to Admission medications   Medication Sig Start Date End Date Taking? Authorizing Provider  ALPRAZolam (XANAX) 0.25 MG tablet Take 0.125 mg by mouth 2 (two) times daily.   Yes Historical Provider, MD  calcium citrate-vitamin D (CITRACAL+D) 315-200 MG-UNIT per tablet Take 1 tablet  by mouth 2 (two) times daily.   Yes Historical Provider, MD  diphenhydrAMINE (SOMINEX) 25 MG tablet Take 40 mg by mouth at bedtime.    Yes Historical Provider, MD  Fluticasone-Salmeterol (ADVAIR) 250-50 MCG/DOSE AEPB Inhale 1 puff into the lungs 2 (two) times daily.   Yes Historical Provider, MD  gabapentin (NEURONTIN) 600 MG tablet Take 900 mg by mouth 4 (four) times daily.   Yes Historical Provider, MD  loratadine (CLARITIN) 10 MG tablet Take 10 mg by mouth every morning.   Yes Historical Provider, MD  losartan (COZAAR) 100 MG tablet Take 100 mg by mouth daily.   Yes Historical Provider, MD  metoprolol tartrate (LOPRESSOR) 25 MG tablet Take 25 mg by mouth 2 (two) times daily.   Yes Historical Provider, MD  naproxen (NAPROSYN) 500 MG tablet Take 500 mg by mouth 2 (two) times daily with a meal.   Yes Historical Provider, MD  omeprazole (PRILOSEC OTC) 20 MG tablet Take 20 mg by mouth every morning.   Yes Historical Provider, MD  Psyllium (METAMUCIL PO) Take 500 mg by mouth 2 (two) times daily.   Yes Historical Provider, MD  QUEtiapine (SEROQUEL) 25 MG tablet Take 25 mg by mouth at bedtime.   Yes Historical Provider, MD  sulfaSALAzine (AZULFIDINE) 500 MG tablet Take 1,000 mg by mouth 2 (two) times daily.   Yes Historical Provider, MD    REVIEW OF SYSTEMS:  Review of Systems  Constitutional: Positive for malaise/fatigue. Negative  for chills, fever and weight loss.  HENT: Negative for ear pain, hearing loss and tinnitus.   Eyes: Negative for blurred vision, double vision, pain and redness.  Respiratory: Positive for cough, sputum production and shortness of breath. Negative for hemoptysis.   Cardiovascular: Negative for chest pain, palpitations, orthopnea and leg swelling.  Gastrointestinal: Negative for abdominal pain, constipation, diarrhea, nausea and vomiting.  Genitourinary: Negative for dysuria, frequency and hematuria.  Musculoskeletal: Negative for back pain, joint pain and neck pain.   Skin:       No acne, rash, or lesions  Neurological: Positive for weakness. Negative for dizziness, tremors and focal weakness.  Endo/Heme/Allergies: Negative for polydipsia. Does not bruise/bleed easily.  Psychiatric/Behavioral: Negative for depression. The patient is not nervous/anxious and does not have insomnia.      VITAL SIGNS:   Vitals:   05/21/16 2002 05/21/16 2059 05/21/16 2154 05/21/16 2341  BP: (!) 158/119 (!) 169/80 (!) 176/96 (!) 172/70  Pulse:  80 91 81  Resp:  18 18 18   Temp:      SpO2:  90% 90% 93%  Weight:      Height:       Wt Readings from Last 3 Encounters:  05/21/16 74.8 kg (165 lb)  02/06/15 75.3 kg (166 lb)    PHYSICAL EXAMINATION:  Physical Exam  Vitals reviewed. Constitutional: She is oriented to person, place, and time. She appears well-developed and well-nourished. No distress.  HENT:  Head: Normocephalic and atraumatic.  Mouth/Throat: Oropharynx is clear and moist.  Eyes: Conjunctivae and EOM are normal. Pupils are equal, round, and reactive to light. No scleral icterus.  Neck: Normal range of motion. Neck supple. No JVD present. No thyromegaly present.  Cardiovascular: Normal rate, regular rhythm and intact distal pulses.  Exam reveals no gallop and no friction rub.   No murmur heard. Respiratory: Effort normal and breath sounds normal. No respiratory distress. She has no wheezes. She has no rales.  GI: Soft. Bowel sounds are normal. She exhibits no distension. There is no tenderness.  Musculoskeletal: Normal range of motion. She exhibits edema (1+).  No arthritis, no gout  Lymphadenopathy:    She has no cervical adenopathy.  Neurological: She is alert and oriented to person, place, and time. No cranial nerve deficit.  No dysarthria, no aphasia  Skin: Skin is warm and dry. No rash noted. No erythema.  Psychiatric: She has a normal mood and affect. Her behavior is normal. Judgment and thought content normal.    LABORATORY PANEL:    CBC  Recent Labs Lab 05/21/16 1941  WBC 11.7*  HGB 12.6  HCT 37.0  PLT 284   ------------------------------------------------------------------------------------------------------------------  Chemistries   Recent Labs Lab 05/21/16 1941  NA 134*  K 3.9  CL 97*  CO2 29  GLUCOSE 129*  BUN 13  CREATININE 0.77  CALCIUM 9.7  AST 25  ALT 13*  ALKPHOS 60  BILITOT 0.5   ------------------------------------------------------------------------------------------------------------------  Cardiac Enzymes  Recent Labs Lab 05/21/16 1941  TROPONINI <0.03   ------------------------------------------------------------------------------------------------------------------  RADIOLOGY:  Dg Chest Portable 1 View  Result Date: 05/21/2016 CLINICAL DATA:  Generalized weakness. Productive cough. History of COPD and diabetes. EXAM: PORTABLE CHEST 1 VIEW COMPARISON:  02/07/2015 and 02/06/2015. FINDINGS: 2001 hour. The heart size and mediastinal contours are stable. There is aortic atherosclerosis. There is mild vascular congestion with interval improved aeration of the lung bases. No edema, confluent airspace opacity, pneumothorax or significant pleural effusion. The bones appear unchanged. IMPRESSION: Interval improved aeration of  the lung bases. No acute cardiopulmonary process. Electronically Signed   By: Richardean Sale M.D.   On: 05/21/2016 20:53    EKG:   Orders placed or performed during the hospital encounter of 05/21/16  . EKG 12-Lead  . EKG 12-Lead  . ED EKG  . ED EKG    IMPRESSION AND PLAN:  Principal Problem:   COPD exacerbation ([place]) - abscess, steroids, antibiotics given in the ED. Continuing the same treatment on admission, as well as home meds and inhalers. Active Problems:   Diabetes ([place]) - sliding scale insulin with corresponding glucose checks   Physical deconditioning - PT consult  All the records are reviewed and case discussed with ED  provider. Management plans discussed with the patient and/or family.  DVT PROPHYLAXIS: SubQ lovenox  GI PROPHYLAXIS: PPI  ADMISSION STATUS: Inpatient  CODE STATUS: DNR Code Status History    Date Active Date Inactive Code Status Order ID Comments User Context   02/06/2015  9:55 PM 02/07/2015  8:29 PM DNR [id number]  Bettey Costa, MD Inpatient    Questions for Most Recent Historical Code Status (Order [id number])    Question Answer Comment   In the event of cardiac or respiratory ARREST Do not call a "code blue"    In the event of cardiac or respiratory ARREST Do not perform Intubation, CPR, defibrillation or ACLS    In the event of cardiac or respiratory ARREST Use medication by any route, position, wound care, and other measures to relive pain and suffering. May use oxygen, suction and manual treatment of airway obstruction as needed for comfort.         Advance Directive Documentation   Flowsheet Row Most Recent Value  Type of Advance Directive  Healthcare Power of Attorney  Pre-existing out of facility DNR order (yellow form or pink MOST form)  No data  "MOST" Form in Place?  No data      TOTAL TIME TAKING CARE OF THIS PATIENT: 45 minutes.    Tayjon Halladay FIELDING 05/21/2016, 11:57 PM  [facility]  Office  [phone]  CC: Primary care physician; Juluis Pitch, MD

## 2016-05-21 NOTE — ED Provider Notes (Signed)
[facility] Emergency Department Provider Note  ____________________________________________   First MD Initiated Contact with Patient 05/21/16 1949     (approximate)  I have reviewed the triage vital signs and the nursing notes.   HISTORY  Chief Complaint Fall and Cough    HPI Chelsea [name] is a 82 y.o. female her for evaluation of weakness cough which is productive and nasal congestion since Friday. No fever. Denies body aches. Patient reports she's had a frequent productive cough the last 3 days, and had increased weakness from her baseline. No slurring of her speech, no particular weakness in one arm or leg, no facial droop.  No chest pain and she denies shortness of breath.  Feels very tired the point she unable to walk well.  Past Medical History:  Diagnosis Date  . COPD (chronic obstructive pulmonary disease) ([place])   . Diabetes mellitus without complication [facility])     Patient Active Problem List   Diagnosis Date Noted  . COPD exacerbation ([place]) 05/21/2016  . Diabetes ([place]) 05/21/2016  . Physical deconditioning 05/21/2016  . Sepsis ([place]) 02/06/2015    Past Surgical History:  Procedure Laterality Date  . ABDOMINAL HYSTERECTOMY      Prior to Admission medications   Medication Sig Start Date End Date Taking? Authorizing Provider  ALPRAZolam (XANAX) 0.25 MG tablet Take 0.125 mg by mouth 2 (two) times daily.   Yes Historical Provider, MD  calcium citrate-vitamin D (CITRACAL+D) 315-200 MG-UNIT per tablet Take 1 tablet by mouth 2 (two) times daily.   Yes Historical Provider, MD  diphenhydrAMINE (SOMINEX) 25 MG tablet Take 40 mg by mouth at bedtime.    Yes Historical Provider, MD  Fluticasone-Salmeterol (ADVAIR) 250-50 MCG/DOSE AEPB Inhale 1 puff into the lungs 2 (two) times daily.   Yes Historical Provider, MD  gabapentin (NEURONTIN) 600 MG tablet Take 900 mg by mouth 4 (four) times daily.   Yes Historical Provider, MD  loratadine (CLARITIN)  10 MG tablet Take 10 mg by mouth every morning.   Yes Historical Provider, MD  losartan (COZAAR) 100 MG tablet Take 100 mg by mouth daily.   Yes Historical Provider, MD  metoprolol tartrate (LOPRESSOR) 25 MG tablet Take 25 mg by mouth 2 (two) times daily.   Yes Historical Provider, MD  naproxen (NAPROSYN) 500 MG tablet Take 500 mg by mouth 2 (two) times daily with a meal.   Yes Historical Provider, MD  omeprazole (PRILOSEC OTC) 20 MG tablet Take 20 mg by mouth every morning.   Yes Historical Provider, MD  Psyllium (METAMUCIL PO) Take 500 mg by mouth 2 (two) times daily.   Yes Historical Provider, MD  QUEtiapine (SEROQUEL) 25 MG tablet Take 25 mg by mouth at bedtime.   Yes Historical Provider, MD  sulfaSALAzine (AZULFIDINE) 500 MG tablet Take 1,000 mg by mouth 2 (two) times daily.   Yes Historical Provider, MD    Allergies Lyrica [pregabalin]  Family History  Problem Relation Age of Onset  . Diabetes Mother   . Liver cancer Father     Social History Social History  Substance Use Topics  . Smoking status: Former Research scientist (life sciences)  . Smokeless tobacco: Never Used  . Alcohol use No    Review of Systems Constitutional: No fever/chills. Feels very fatigued in general over the last 3 days. Eyes: No visual changes. ENT: No sore throat. Cardiovascular: Denies chest pain. Respiratory: Denies feeling short of breath, but has had a productive cough over the last 2-3 days. She denies any  wheezing. Gastrointestinal: No abdominal pain.  No nausea, no vomiting. Genitourinary: Negative for dysuria. Musculoskeletal: Negative for back pain. Skin: Negative for rash. Neurological: Negative for headaches, focal weakness or numbness.  10-point ROS otherwise negative.  ____________________________________________   PHYSICAL EXAM:  VITAL SIGNS: ED Triage Vitals  Enc Vitals Group     BP 05/21/16 1931 (!) 183/17     Pulse Rate 05/21/16 1931 89     Resp 05/21/16 1931 20     Temp 05/21/16 1931 98.9 F  (37.2 C)     Temp src --      SpO2 05/21/16 1931 91 %     Weight 05/21/16 1932 165 lb (74.8 kg)     Height 05/21/16 1932 5' 2"  (1.575 m)     Head Circumference --      Peak Flow --      Pain Score 05/21/16 1949 10     Pain Loc --      Pain Edu? --      Excl. in [place]? --     Constitutional: Alert and oriented. Well appearing and in no acute distress.Does appear chronically ill. Daughter at bedside, both very pleasant and amicable. Eyes: Conjunctivae are normal. PERRL. EOMI. Head: Atraumatic. Nose: No congestion/rhinnorhea. Mouth/Throat: Mucous membranes are moist.  Oropharynx non-erythematous. Neck: No stridor.   Cardiovascular: Normal rate, regular rhythm. Grossly normal heart sounds.  Good peripheral circulation. Respiratory: Normal respiratory effort.  No retractions. Left lung clear all fields. The right lung demonstrates focal rales the right lower base. There is no wheezing. She speaks in full normal sentences, room air saturation of about 90%. Patient presently denying feeling dyspneic. Denies shortness of breath. She does use 3 L oxygen at night, but only due to sleep apnea per her daughter. Gastrointestinal: Soft and nontender. No distention. Musculoskeletal: No lower extremity tenderness nor edema.  No joint effusions. Chronic deformity of the left knee, patient reports this is chronic as well as her daughter. She able to range her lower extremities well with 5 out of 5 strength. 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 Neurologic:  Normal speech and language. No gross focal neurologic deficits are appreciated.  Skin:  Skin is warm, dry and intact. No rash noted. Psychiatric: Mood and affect are normal. Speech and behavior are normal.  ____________________________________________   LABS (all labs ordered are listed, but only abnormal results are displayed)  Labs Reviewed  CBC WITH  DIFFERENTIAL/PLATELET - Abnormal; Notable for the following:       Result Value   WBC 11.7 (*)    Neutro Abs 8.5 (*)    Monocytes Absolute 1.3 (*)    All other components within normal limits  COMPREHENSIVE METABOLIC PANEL - Abnormal; Notable for the following:    Sodium 134 (*)    Chloride 97 (*)    Glucose, Bld 129 (*)    ALT 13 (*)    All other components within normal limits  URINALYSIS, ROUTINE W REFLEX MICROSCOPIC - Abnormal; Notable for the following:    Color, Urine YELLOW (*)    APPearance CLEAR (*)    Protein, ur 100 (*)    Leukocytes, UA TRACE (*)    Squamous Epithelial / LPF 0-5 (*)    All other components within normal limits  URINE CULTURE  CULTURE, BLOOD (ROUTINE X 2)  CULTURE, BLOOD (ROUTINE X 2)  TROPONIN I   ____________________________________________  EKG  Reviewed  and interpreted by me at 1930 Ventricular rate 90 QRS 70 QTc 4:30 Normal sinus rhythm, no acute ischemia noted. T waves normal with some slight artifact ____________________________________________  RADIOLOGY  Dg Chest Portable 1 View  Result Date: 05/21/2016 CLINICAL DATA:  Generalized weakness. Productive cough. History of COPD and diabetes. EXAM: PORTABLE CHEST 1 VIEW COMPARISON:  02/07/2015 and 02/06/2015. FINDINGS: 2001 hour. The heart size and mediastinal contours are stable. There is aortic atherosclerosis. There is mild vascular congestion with interval improved aeration of the lung bases. No edema, confluent airspace opacity, pneumothorax or significant pleural effusion. The bones appear unchanged. IMPRESSION: Interval improved aeration of the lung bases. No acute cardiopulmonary process. Electronically Signed   By: Richardean Sale M.D.   On: 05/21/2016 20:53    ____________________________________________   PROCEDURES  Procedure(s) performed: None  Procedures  Critical Care performed: No  ____________________________________________   INITIAL IMPRESSION / ASSESSMENT AND  PLAN / ED COURSE  Pertinent labs & imaging results that were available during my care of the patient were reviewed by me and considered in my medical decision making (see chart for details).  Generalized weakness. No focal neurologic symptoms. Has a history of general weakness requiring use of a walker. A few falls earlier in the week, all onto her buttock without evidence of injury. No head strikes. She has had a productive cough, increased generalized weakness to the point is very difficult for her to ambulate over the last day. She has focal rails, and very suspicious clinically for possible pneumonia. She does not exhibit any obvious evidence of a COPD exacerbation except her O2 sat is marginal, but without wheezing or dyspnea reported.  No evidence of acute cardiac abnormality based on clinical history and EKG. We'll check basic labs, chest x-ray, urinalysis, and follow her closely here.  Clinical Course     Patient noted to have hypoxia while on room air with a sat of 86% while awake. Good pleasant waveform. We'll give additional nebulizer treatments, the patient also attempted ambulation using her walker and was unable to do so successfully. Because of significant risk for falls, inability to ambulate and what appears to be clinically right lower lobe pneumonia without radiographic evidence, and likely COPD exacerbation the patient will be admitted the hospital for further management. Patient and daughter are agreeable. ____________________________________________   FINAL CLINICAL IMPRESSION(S) / ED DIAGNOSES  Final diagnoses:  COPD exacerbation ([place])  Community acquired pneumonia of right lower lobe of lung ([place])      NEW MEDICATIONS STARTED DURING THIS VISIT:  New Prescriptions   No medications on file     Note:  This document was prepared using Dragon voice recognition software and may include unintentional dictation errors.     Delman Kitten, MD 05/22/16 [phone]

## 2016-05-21 NOTE — ED Notes (Addendum)
Pt ambulated in room with walker by Daiva Nakayama, RN and Marcie Bal, EDT. Pt was able to walk to door w/ difficulty, but came very close to falling backward on the return trip to the stretcher. Pt denies any c/o lightheadedness or dizziness; pt reports bilateral leg weakness. Pt assisted back into bed; monitoring devices reattached. O2 sats following ambulation noted to be 86-88% on RA. Dr Jacqualine Code made aware of pt's inability to ambulate unsafely and decreased O2 sats.

## 2016-05-21 NOTE — ED Triage Notes (Signed)
Pt has cough, congestion x 2 days. Has fallen 4x today and states her body feels weak

## 2016-05-22 ENCOUNTER — Encounter: Payer: Self-pay | Admitting: Emergency Medicine

## 2016-05-22 LAB — GLUCOSE, CAPILLARY
GLUCOSE-CAPILLARY: 168 mg/dL — AB (ref 65–99)
Glucose-Capillary: 206 mg/dL — ABNORMAL HIGH (ref 65–99)
Glucose-Capillary: 106 mg/dL — ABNORMAL HIGH (ref 65–99)
Glucose-Capillary: 179 mg/dL — ABNORMAL HIGH (ref 65–99)
Glucose-Capillary: 206 mg/dL — ABNORMAL HIGH (ref 65–99)

## 2016-05-22 LAB — CBC
HEMATOCRIT: 34.6 % — AB (ref 35.0–47.0)
Hemoglobin: 11.7 g/dL — ABNORMAL LOW (ref 12.0–16.0)
MCH: 32.3 pg (ref 26.0–34.0)
MCHC: 33.9 g/dL (ref 32.0–36.0)
MCV: 95.1 fL (ref 80.0–100.0)
PLATELETS: 277 10*3/uL (ref 150–440)
RBC: 3.64 MIL/uL — AB (ref 3.80–5.20)
RDW: 13 % (ref 11.5–14.5)
WBC: 12.6 10*3/uL — ABNORMAL HIGH (ref 3.6–11.0)

## 2016-05-22 LAB — BASIC METABOLIC PANEL
Anion gap: 9 (ref 5–15)
BUN: 14 mg/dL (ref 6–20)
CHLORIDE: 99 mmol/L — AB (ref 101–111)
CO2: 28 mmol/L (ref 22–32)
CREATININE: 0.87 mg/dL (ref 0.44–1.00)
Calcium: 9.6 mg/dL (ref 8.9–10.3)
GFR calc non Af Amer: 60 mL/min — ABNORMAL LOW (ref >60)
Glucose, Bld: 144 mg/dL — ABNORMAL HIGH (ref 65–99)
Potassium: 3.9 mmol/L (ref 3.5–5.1)
Sodium: 136 mmol/L (ref 135–145)

## 2016-05-22 MED ORDER — SODIUM CHLORIDE 0.9% FLUSH
3.0000 mL | Freq: Two times a day (BID) | INTRAVENOUS | Status: DC
  Administered 2016-05-22 – 2016-05-23 (×4): 3 mL via INTRAVENOUS

## 2016-05-22 MED ORDER — MOMETASONE FURO-FORMOTEROL FUM 200-5 MCG/ACT IN AERO
2.0000 | INHALATION_SPRAY | Freq: Two times a day (BID) | RESPIRATORY_TRACT | Status: DC
  Administered 2016-05-22 – 2016-05-23 (×3): 2 via RESPIRATORY_TRACT
  Filled 2016-05-22: qty 8.8

## 2016-05-22 MED ORDER — ACETAMINOPHEN 325 MG PO TABS: 650.0000 mg | ORAL_TABLET | Freq: Four times a day (QID) | ORAL | Status: DC | PRN

## 2016-05-22 MED ORDER — ACETAMINOPHEN 650 MG RE SUPP: 650.0000 mg | Freq: Four times a day (QID) | RECTAL | Status: DC | PRN

## 2016-05-22 MED ORDER — ENOXAPARIN SODIUM 40 MG/0.4ML ~~LOC~~ SOLN
40.0000 mg | SUBCUTANEOUS | Status: DC
  Administered 2016-05-22: 22:00:00 40 mg via SUBCUTANEOUS
  Filled 2016-05-22: qty 0.4

## 2016-05-22 MED ORDER — ONDANSETRON HCL 4 MG PO TABS: 4.0000 mg | ORAL_TABLET | Freq: Four times a day (QID) | ORAL | Status: DC | PRN

## 2016-05-22 MED ORDER — QUETIAPINE FUMARATE 25 MG PO TABS
25.0000 mg | ORAL_TABLET | Freq: Every day | ORAL | Status: DC
  Administered 2016-05-22: 22:00:00 25 mg via ORAL
  Filled 2016-05-22: qty 1

## 2016-05-22 MED ORDER — INSULIN ASPART 100 UNIT/ML ~~LOC~~ SOLN
0.0000 [IU] | Freq: Three times a day (TID) | SUBCUTANEOUS | Status: DC
  Administered 2016-05-22: 17:00:00 3 [IU] via SUBCUTANEOUS
  Administered 2016-05-22 – 2016-05-23 (×2): 2 [IU] via SUBCUTANEOUS
  Administered 2016-05-23: 1 [IU] via SUBCUTANEOUS
  Filled 2016-05-22: qty 2
  Filled 2016-05-22: qty 1
  Filled 2016-05-22: qty 3
  Filled 2016-05-22: qty 2

## 2016-05-22 MED ORDER — INSULIN ASPART 100 UNIT/ML ~~LOC~~ SOLN
0.0000 [IU] | Freq: Every day | SUBCUTANEOUS | Status: DC
  Administered 2016-05-22: 2 [IU] via SUBCUTANEOUS
  Filled 2016-05-22: qty 2

## 2016-05-22 MED ORDER — LOSARTAN POTASSIUM 50 MG PO TABS
100.0000 mg | ORAL_TABLET | Freq: Every day | ORAL | Status: DC
  Administered 2016-05-22 – 2016-05-23 (×2): 100 mg via ORAL
  Filled 2016-05-22 (×2): qty 2

## 2016-05-22 MED ORDER — ALPRAZOLAM 0.25 MG PO TABS
0.1250 mg | ORAL_TABLET | Freq: Two times a day (BID) | ORAL | Status: DC | PRN
  Administered 2016-05-23: 08:00:00 0.125 mg via ORAL
  Filled 2016-05-22: qty 1

## 2016-05-22 MED ORDER — LEVOFLOXACIN 750 MG PO TABS
750.0000 mg | ORAL_TABLET | Freq: Every day | ORAL | Status: DC
  Administered 2016-05-22: 17:00:00 750 mg via ORAL
  Filled 2016-05-22: qty 1

## 2016-05-22 MED ORDER — GABAPENTIN 300 MG PO CAPS
900.0000 mg | ORAL_CAPSULE | Freq: Four times a day (QID) | ORAL | Status: DC
  Administered 2016-05-22 – 2016-05-23 (×6): 900 mg via ORAL
  Filled 2016-05-22 (×6): qty 3

## 2016-05-22 MED ORDER — HYDRALAZINE HCL 20 MG/ML IJ SOLN: 10.0000 mg | INTRAMUSCULAR | Status: DC | PRN

## 2016-05-22 MED ORDER — IPRATROPIUM-ALBUTEROL 0.5-2.5 (3) MG/3ML IN SOLN: 3.0000 mL | RESPIRATORY_TRACT | Status: DC | PRN

## 2016-05-22 MED ORDER — ONDANSETRON HCL 4 MG/2ML IJ SOLN: 4.0000 mg | Freq: Four times a day (QID) | INTRAMUSCULAR | Status: DC | PRN

## 2016-05-22 MED ORDER — PREDNISONE 20 MG PO TABS
40.0000 mg | ORAL_TABLET | Freq: Every day | ORAL | Status: DC
  Administered 2016-05-22 – 2016-05-23 (×2): 40 mg via ORAL
  Filled 2016-05-22 (×2): qty 2

## 2016-05-22 MED ORDER — DIPHENHYDRAMINE HCL 25 MG PO CAPS
25.0000 mg | ORAL_CAPSULE | Freq: Every day | ORAL | Status: DC
  Administered 2016-05-22: 22:00:00 25 mg via ORAL
  Filled 2016-05-22: qty 1

## 2016-05-22 MED ORDER — ALPRAZOLAM 0.25 MG PO TABS
0.1250 mg | ORAL_TABLET | Freq: Two times a day (BID) | ORAL | Status: DC
  Administered 2016-05-22 (×2): 0.125 mg via ORAL
  Filled 2016-05-22 (×2): qty 1

## 2016-05-22 MED ORDER — METOPROLOL TARTRATE 25 MG PO TABS
25.0000 mg | ORAL_TABLET | Freq: Two times a day (BID) | ORAL | Status: DC
  Administered 2016-05-22 – 2016-05-23 (×3): 25 mg via ORAL
  Filled 2016-05-22 (×3): qty 1

## 2016-05-22 MED ORDER — PANTOPRAZOLE SODIUM 40 MG PO TBEC
40.0000 mg | DELAYED_RELEASE_TABLET | Freq: Every day | ORAL | Status: DC
  Administered 2016-05-22 – 2016-05-23 (×2): 40 mg via ORAL
  Filled 2016-05-22 (×2): qty 1

## 2016-05-22 NOTE — Care Management Obs Status (Signed)
[facility] NOTIFICATION   Patient Details  Name: Chelsea [name] MRN: [id number] Date of Birth: 11-14-1930   Medicare Observation Status Notification Given:  Yes    Prabhnoor Ellenberger A, RN 05/22/2016, 3:05 PM

## 2016-05-22 NOTE — Evaluation (Signed)
Physical Therapy Evaluation Patient Details Name: Chelsea [name] MRN: [id number] DOB: 1930-06-06 Today's Date: 05/22/2016   History of Present Illness  presented to ER secondary to cough, congestion and multiple falls in home environment; admitted wtih COPD exacerbation.  Clinical Impression  Patient pleasant and cooperative, but generally confused and questionable historian.  Currently requiring min assist with RW for all mobility (gait distance limited to 30' due to fatigue); very slow and deliberate, poor balance reactions, limited activity tolerance.  Requires min assist from therapist for weight shifting, task direction/recall throughout session.  Continue to recommend use of RW and +1 assist for all mobility (24/7) at this time due to poor balance/high fall risk. Patient on 3L supplemental O2 upon arrival to room; per MD, okay to wean as tolerated (does not wear during day per patient report).  Able to tolerate light activity on RA with sats >90%, minimal SOB/DOE noted.  Left on RA end of session.  RN informed/aware. Would benefit from skilled PT to address above deficits and promote optimal return to PLOF;     Follow Up Recommendations SNF    Equipment Recommendations  Rolling walker with 5" wheels    Recommendations for Other Services       Precautions / Restrictions Precautions Precautions: Fall Restrictions Weight Bearing Restrictions: No      Mobility  Bed Mobility Overal bed mobility: Needs Assistance Bed Mobility: Supine to Sit     Supine to sit: Min assist        Transfers Overall transfer level: Needs assistance Equipment used: Rolling walker (2 wheeled) Transfers: Sit to/from Stand Sit to Stand: Min assist         General transfer comment: cuing for hand placement; increased time to complete  Ambulation/Gait Ambulation/Gait assistance: Min assist Ambulation Distance (Feet): 30 Feet Assistive device: Rolling walker (2 wheeled)       General  Gait Details: step to gait pattern, forward flexed posture.  Decreased cadence and gait speed, decreased anterior/lateral weight shifting (min assist from therapist to assist with unweighting/advancing LEs)  Stairs            Wheelchair Mobility    Modified Rankin (Stroke Patients Only)       Balance Overall balance assessment: Needs assistance Sitting-balance support: No upper extremity supported;Feet supported Sitting balance-Leahy Scale: Good     Standing balance support: Bilateral upper extremity supported Standing balance-Leahy Scale: Poor Standing balance comment: posterior trunk lean requriign increased time for accommodation to midline in A/P plane                             Pertinent Vitals/Pain Pain Assessment: No/denies pain    Home Living Family/patient expects to be discharged to:: Private residence Living Arrangements: Children Available Help at Discharge: Family;Personal care attendant;Available 24 hours/day (reports daughter works outside of home; has Insurance claims handler" with her when daughter is away) Type of Home: House Home Access: Stairs to enter Entrance Stairs-Rails: None Entrance Stairs-Number of Steps: 3 Home Layout: One level Home Equipment: Walker - 4 wheels Additional Comments: Patient generally confused; questionable historian.  Will verify with family as available.    Prior Function Level of Independence: Needs assistance         Comments: Per patient, ambulatory for household distances with [phone] with nurse "holding my arm".  Assist for ADLs and household chores as needed.  Does endorse multiple fall history.     Hand Dominance  Extremity/Trunk Assessment   Upper Extremity Assessment Upper Extremity Assessment: Generalized weakness (elevation to shoulder height bilat)    Lower Extremity Assessment Lower Extremity Assessment: Generalized weakness (grossly 3+/5 throughout; L ankle lacking 5-8 degrees passive DF.  reports  baseline neuropathy in bilat feet (unchanged with this admission))       Communication   Communication: No difficulties  Cognition Arousal/Alertness: Awake/alert Behavior During Therapy: WFL for tasks assessed/performed Overall Cognitive Status: History of cognitive impairments - at baseline (oriented to self, location only; limited awareness of date, time frame, medical condition and safety needs.  Slightly impulsive; poor STM)                      General Comments      Exercises Other Exercises Other Exercises: Sit/stand x3 with RW, min assist--cuing for hand placement; assist for lift off and standing balance.  Very slow and deliberate. Other Exercises: Orthostatic assessment with movement transitions-vitals stable and WFL; no reports of subjective orthostasis. Other Exercises: Bed/chair transfer with RW, min assist-frequent verbal cuing for task recall and direction; tends to let go of walker and reach for armrests, external surfaces   Assessment/Plan    PT Assessment Patient needs continued PT services  PT Problem List Decreased strength;Decreased range of motion;Decreased activity tolerance;Decreased balance;Decreased mobility;Decreased cognition;Decreased knowledge of use of DME;Decreased safety awareness;Decreased knowledge of precautions;Cardiopulmonary status limiting activity          PT Treatment Interventions DME instruction;Gait training;Stair training;Functional mobility training;Therapeutic exercise;Balance training;Therapeutic activities;Patient/family education    PT Goals (Current goals can be found in the Care Plan section)  Acute Rehab PT Goals Patient Stated Goal: to get some therapy out to the house PT Goal Formulation: With patient Time For Goal Achievement: 06/05/16 Potential to Achieve Goals: Fair    Frequency Min 2X/week   Barriers to discharge Decreased caregiver support      Co-evaluation               End of Session Equipment  Utilized During Treatment: Gait belt Activity Tolerance: Patient tolerated treatment well Patient left: in chair;with call bell/phone within reach;with chair alarm set Nurse Communication: Mobility status (RA end of session)    Functional Assessment Tool Used: clinical judgement Functional Limitation: Mobility: Walking and moving around Mobility: Walking and Moving Around Current Status [phone]): At least 20 percent but less than 40 percent impaired, limited or restricted Mobility: Walking and Moving Around Goal Status [phone]): At least 1 percent but less than 20 percent impaired, limited or restricted    Time: 2831-5176 PT Time Calculation (min) (ACUTE ONLY): 35 min   Charges:   PT Evaluation $PT Eval Low Complexity: 1 Procedure PT Treatments $Therapeutic Activity: 8-22 mins   PT G Codes:   PT G-Codes **NOT FOR INPATIENT CLASS** Functional Assessment Tool Used: clinical judgement Functional Limitation: Mobility: Walking and moving around Mobility: Walking and Moving Around Current Status (H6073): At least 20 percent but less than 40 percent impaired, limited or restricted Mobility: Walking and Moving Around Goal Status [phone]): At least 1 percent but less than 20 percent impaired, limited or restricted   Kyleena Scheirer H. Owens Shark, PT, DPT, NCS 05/22/16, 3:35 PM [phone]

## 2016-05-22 NOTE — Care Management Note (Addendum)
Case Management Note  Patient Details  Name: Chelsea [name] MRN: [id number] Date of Birth: 1931/04/26  Subjective/Objective:      Discussed current home living situation with daughter Kathi Der with whom [name] resides.  [name] uses a walker at home. Per daughter's report, [name] has an Aide from [employer] Monday through Friday from 11am to 4pm   Daughter is with [name] at night and 24/7 on Saturday and Sunday. Daughter arrives home from work Mon thru Fri at [employer]. [name] normally sleeps very late per her daughter. Daughter peports that [name] is paying out of pocket for [employer] services.Marland KitchenMOON left with [name] to discuss with her daughter Case. Management will follow for discharge planning.          Action/Plan:   Expected Discharge Date:  05/24/16               Expected Discharge Plan:     In-House Referral:     Discharge planning Services     Post Acute Care Choice:    Choice offered to:     DME Arranged:    DME Agency:     HH Arranged:    HH Agency:     Status of Service:     If discussed at [employer], dates discussed:    Additional Comments:  Khloey Chern A, RN 05/22/2016, 11:42 AM

## 2016-05-22 NOTE — Progress Notes (Signed)
[place] [facility] [place] NAME: Chelsea [name]    MRN#:  [id number]  DATE OF BIRTH:  September 07, 1930  SUBJECTIVE:  Hospital Day: 0 days Chelsea [name] is a 82 y.o. female presenting with Fall and Cough .   Overnight events: No acute overnight events Interval Events: No complaints morning  REVIEW OF SYSTEMS:  CONSTITUTIONAL: No fever, fatigue or weakness.  EYES: No blurred or double vision.  EARS, NOSE, AND THROAT: No tinnitus or ear pain.  RESPIRATORY: No cough, shortness of breath, wheezing or hemoptysis.  CARDIOVASCULAR: No chest pain, orthopnea, edema.  GASTROINTESTINAL: No nausea, vomiting, diarrhea or abdominal pain.  GENITOURINARY: No dysuria, hematuria.  ENDOCRINE: No polyuria, nocturia,  HEMATOLOGY: No anemia, easy bruising or bleeding SKIN: No rash or lesion. MUSCULOSKELETAL: No joint pain or arthritis.   NEUROLOGIC: No tingling, numbness, weakness.  PSYCHIATRY: No anxiety or depression.   DRUG ALLERGIES:   Allergies  Allergen Reactions  . Lyrica [Pregabalin] Swelling    VITALS:  Blood pressure (!) 167/69, pulse (!) 113, temperature 97.5 F (36.4 C), temperature source Oral, resp. rate 18, height 5' 2"  (1.575 m), weight 77.1 kg (170 lb), SpO2 90 %.  PHYSICAL EXAMINATION:  VITAL SIGNS: Vitals:   05/22/16 1004 05/22/16 1015  BP: (!) 167/69   Pulse: (!) 113 (!) 113  Resp: 18   Temp:     GENERAL:82 y.o.female currently in no acute distress.  HEAD: Normocephalic, atraumatic.  EYES: Pupils equal, round, reactive to light. Extraocular muscles intact. No scleral icterus.  MOUTH: Moist mucosal membrane. Dentition intact. No abscess noted.  EAR, NOSE, THROAT: Clear without exudates. No external lesions.  NECK: Supple. No thyromegaly. No nodules. No JVD.  PULMONARY: Diminished breath sounds without wheeze rails or rhonci. No use of accessory muscles, Good respiratory effort. good air entry bilaterally CHEST: Nontender to  palpation.  CARDIOVASCULAR: S1 and S2. Regular rate and rhythm. No murmurs, rubs, or gallops. No edema. Pedal pulses 2+ bilaterally.  GASTROINTESTINAL: Soft, nontender, nondistended. No masses. Positive bowel sounds. No hepatosplenomegaly.  MUSCULOSKELETAL: No swelling, clubbing, or edema. Range of motion full in all extremities.  NEUROLOGIC: Cranial nerves II through XII are intact. No gross focal neurological deficits. Sensation intact. Reflexes intact.  SKIN: No ulceration, lesions, rashes, or cyanosis. Skin warm and dry. Turgor intact.  PSYCHIATRIC: Mood, affect within normal limits. The patient is awake, alert and oriented x 3. Insight, judgment intact.      LABORATORY PANEL:   CBC  Recent Labs Lab 05/22/16 0519  WBC 12.6*  HGB 11.7*  HCT 34.6*  PLT 277   ------------------------------------------------------------------------------------------------------------------  Chemistries   Recent Labs Lab 05/21/16 1941 05/22/16 0519  NA 134* 136  K 3.9 3.9  CL 97* 99*  CO2 29 28  GLUCOSE 129* 144*  BUN 13 14  CREATININE 0.77 0.87  CALCIUM 9.7 9.6  AST 25  --   ALT 13*  --   ALKPHOS 60  --   BILITOT 0.5  --    ------------------------------------------------------------------------------------------------------------------  Cardiac Enzymes  Recent Labs Lab 05/21/16 1941  TROPONINI <0.03   ------------------------------------------------------------------------------------------------------------------  RADIOLOGY:  Dg Chest Portable 1 View  Result Date: 05/21/2016 CLINICAL DATA:  Generalized weakness. Productive cough. History of COPD and diabetes. EXAM: PORTABLE CHEST 1 VIEW COMPARISON:  02/07/2015 and 02/06/2015. FINDINGS: 2001 hour. The heart size and mediastinal contours are stable. There is aortic atherosclerosis. There is mild vascular congestion with interval improved aeration of the lung bases. No edema, confluent  airspace opacity, pneumothorax or  significant pleural effusion. The bones appear unchanged. IMPRESSION: Interval improved aeration of the lung bases. No acute cardiopulmonary process. Electronically Signed   By: Richardean Sale M.D.   On: 05/21/2016 20:53    EKG:   Orders placed or performed during the hospital encounter of 05/21/16  . EKG 12-Lead  . EKG 12-Lead    ASSESSMENT AND PLAN:   Chelsea [name] is a 82 y.o. female presenting with Fall and Cough . Admitted 05/21/2016 : Day #: 0 days 1. COPD exacerbation continue breathing treatments and oxygen as required wean as tolerated, steroids 2. Fall/generalized weakness physical therapy evaluation 3. Essential hypertension: Continue home medications at as needed hydralazine 4. GERD without esophagitis: PPI therapy  All the records are reviewed and case discussed with Care Management/Social Workerr. Management plans discussed with the patient, family and they are in agreement.  CODE STATUS: dnr TOTAL TIME TAKING CARE OF THIS PATIENT: 28 minutes.   POSSIBLE D/C IN 1DAYS, DEPENDING ON CLINICAL CONDITION.   Hower,  Karenann Cai.D on 05/22/2016 at 1:56 PM  Between 7am to 6pm - Pager - [phone]  After 6pm: House Pager: - [place] [facility]  Office  [phone]  CC: Primary care physician; Juluis Pitch, MD

## 2016-05-23 LAB — GLUCOSE, CAPILLARY
Glucose-Capillary: 122 mg/dL — ABNORMAL HIGH (ref 65–99)
Glucose-Capillary: 183 mg/dL — ABNORMAL HIGH (ref 65–99)

## 2016-05-23 LAB — URINE CULTURE: Special Requests: NORMAL

## 2016-05-23 MED ORDER — IPRATROPIUM-ALBUTEROL 0.5-2.5 (3) MG/3ML IN SOLN: 3.0000 mL | RESPIRATORY_TRACT | 0 refills | Status: DC | PRN

## 2016-05-23 MED ORDER — ALPRAZOLAM 0.25 MG PO TABS: 0.1250 mg | ORAL_TABLET | Freq: Two times a day (BID) | ORAL | 0 refills | Status: DC | PRN

## 2016-05-23 MED ORDER — LEVOFLOXACIN 750 MG PO TABS: 750.0000 mg | ORAL_TABLET | Freq: Every day | ORAL | 0 refills | Status: DC

## 2016-05-23 MED ORDER — PREDNISONE 10 MG PO TABS: ORAL_TABLET | ORAL | 0 refills | Status: DC

## 2016-05-23 NOTE — Discharge Summary (Signed)
[facility] [place] NAME: Chelsea [name]    MR#:  [id number]  DATE OF BIRTH:  July 17, 1930  DATE OF ADMISSION:  05/21/2016 ADMITTING PHYSICIAN: Lance Coon, MD  DATE OF DISCHARGE: 05/23/16  PRIMARY CARE PHYSICIAN: Juluis Pitch, MD    ADMISSION DIAGNOSIS:  COPD exacerbation ([place]) [J44.1]   DISCHARGE DIAGNOSIS:  Principal Problem:   COPD exacerbation ([place]) Active Problems:   Diabetes Chelsea [name])   Physical deconditioning   SECONDARY DIAGNOSIS:   Past Medical History:  Diagnosis Date  . COPD (chronic obstructive pulmonary disease) ([place])   . Diabetes mellitus without complication [facility])     [name] COURSE:  Chelsea [name]  is a 82 y.o. female admitted 05/21/2016 with chief complaint Fall and Cough . Please see H&P performed by Lance Coon, MD for further information. Patient presented with the above symptoms. Noted to have desaturation on ambulation on admission. Her oxygen requirement returned to baseline. PT evaluation recommended SNF - to which the family and patient agree.  DISCHARGE CONDITIONS:   stable  CONSULTS OBTAINED:    DRUG ALLERGIES:   Allergies  Allergen Reactions  . Lyrica [Pregabalin] Swelling    DISCHARGE MEDICATIONS:   Current Discharge Medication List    START taking these medications   Details  ipratropium-albuterol (DUONEB) 0.5-2.5 (3) MG/3ML SOLN Take 3 mLs by nebulization every 4 (four) hours as needed. Qty: 360 mL, Refills: 0    levofloxacin (LEVAQUIN) 750 MG tablet Take 1 tablet (750 mg total) by mouth daily. Qty: 3 tablet, Refills: 0    predniSONE (DELTASONE) 10 MG tablet 79mx1 day, 228mx2 day, 1042m2 day then stop Qty: 10 tablet, Refills: 0      CONTINUE these medications which have CHANGED   Details  ALPRAZolam (XANAX) 0.25 MG tablet Take 0.5 tablets (0.125 mg total) by mouth 2 (two) times daily as needed for anxiety. Qty: 30 tablet, Refills: 0      CONTINUE these  medications which have NOT CHANGED   Details  calcium citrate-vitamin D (CITRACAL+D) 315-200 MG-UNIT per tablet Take 1 tablet by mouth 2 (two) times daily.    diphenhydrAMINE (SOMINEX) 25 MG tablet Take 40 mg by mouth at bedtime.     Fluticasone-Salmeterol (ADVAIR) 250-50 MCG/DOSE AEPB Inhale 1 puff into the lungs 2 (two) times daily.    gabapentin (NEURONTIN) 600 MG tablet Take 900 mg by mouth 4 (four) times daily.    loratadine (CLARITIN) 10 MG tablet Take 10 mg by mouth every morning.    losartan (COZAAR) 100 MG tablet Take 100 mg by mouth daily.    metoprolol tartrate (LOPRESSOR) 25 MG tablet Take 25 mg by mouth 2 (two) times daily.    naproxen (NAPROSYN) 500 MG tablet Take 500 mg by mouth 2 (two) times daily with a meal.    omeprazole (PRILOSEC OTC) 20 MG tablet Take 20 mg by mouth every morning.    Psyllium (METAMUCIL PO) Take 500 mg by mouth 2 (two) times daily.    QUEtiapine (SEROQUEL) 25 MG tablet Take 25 mg by mouth at bedtime.    sulfaSALAzine (AZULFIDINE) 500 MG tablet Take 1,000 mg by mouth 2 (two) times daily.         DISCHARGE INSTRUCTIONS:    DIET:  Diabetic diet  DISCHARGE CONDITION:  Stable  ACTIVITY:  Activity as tolerated  OXYGEN:  Home Oxygen: Yes.     Oxygen Delivery: 2 liters/min via Patient connected to nasal cannula oxygen at nighttime  DISCHARGE  LOCATION:  nursing home   If you experience worsening of your admission symptoms, develop shortness of breath, life threatening emergency, suicidal or homicidal thoughts you must seek medical attention immediately by calling 911 or calling your MD immediately  if symptoms less severe.  You Must read complete instructions/literature along with all the possible adverse reactions/side effects for all the Medicines you take and that have been prescribed to you. Take any new Medicines after you have completely understood and accpet all the possible adverse reactions/side effects.   Please note  You  were cared for by a hospitalist during your [name] stay. If you have any questions about your discharge medications or the care you received while you were in the [name] after you are discharged, you can call the unit and asked to speak with the hospitalist on call if the hospitalist that took care of you is not available. Once you are discharged, your primary care physician will handle any further medical issues. Please note that NO REFILLS for any discharge medications will be authorized once you are discharged, as it is imperative that you return to your primary care physician (or establish a relationship with a primary care physician if you do not have one) for your aftercare needs so that they can reassess your need for medications and monitor your lab values.    On the day of Discharge:   VITAL SIGNS:  Blood pressure (!) 149/73, pulse 78, temperature 97.9 F (36.6 C), temperature source Oral, resp. rate (!) 22, height 5' 2"  (1.575 m), weight 77.1 kg (170 lb), SpO2 93 %.  I/O:   Intake/Output Summary (Last 24 hours) at 05/23/16 0944 Last data filed at 05/22/16 1850  Gross per 24 hour  Intake              480 ml  Output              700 ml  Net             -220 ml    PHYSICAL EXAMINATION:  GENERAL:  82 y.o.-year-old patient lying in the bed with no acute distress.  EYES: Pupils equal, round, reactive to light and accommodation. No scleral icterus. Extraocular muscles intact.  HEENT: Head atraumatic, normocephalic. Oropharynx and nasopharynx clear.  NECK:  Supple, no jugular venous distention. No thyroid enlargement, no tenderness.  LUNGS: Normal breath sounds bilaterally, no wheezing, rales,rhonchi or crepitation. No use of accessory muscles of respiration.  CARDIOVASCULAR: S1, S2 normal. No murmurs, rubs, or gallops.  ABDOMEN: Soft, non-tender, non-distended. Bowel sounds present. No organomegaly or mass.  EXTREMITIES: No pedal edema, cyanosis, or clubbing.  NEUROLOGIC: Cranial  nerves II through XII are intact. Muscle strength 5/5 in all extremities. Sensation intact. Gait not checked.  PSYCHIATRIC: The patient is alert and oriented x 3.  SKIN: No obvious rash, lesion, or ulcer.   DATA REVIEW:   CBC  Recent Labs Lab 05/22/16 0519  WBC 12.6*  HGB 11.7*  HCT 34.6*  PLT 277    Chemistries   Recent Labs Lab 05/21/16 1941 05/22/16 0519  NA 134* 136  K 3.9 3.9  CL 97* 99*  CO2 29 28  GLUCOSE 129* 144*  BUN 13 14  CREATININE 0.77 0.87  CALCIUM 9.7 9.6  AST 25  --   ALT 13*  --   ALKPHOS 60  --   BILITOT 0.5  --     Cardiac Enzymes  Recent Labs Lab 05/21/16 1941  TROPONINI <0.03  Microbiology Results  Results for orders placed or performed during the [name] encounter of 05/21/16  Urine culture     Status: Abnormal   Collection Time: 05/21/16  8:25 PM  Result Value Ref Range Status   Specimen Description URINE, CLEAN CATCH  Final   Special Requests Normal  Final   Culture MULTIPLE SPECIES PRESENT, SUGGEST RECOLLECTION (A)  Final   Report Status 05/23/2016 FINAL  Final  Culture, blood (Routine X 2) w Reflex to ID Panel     Status: None (Preliminary result)   Collection Time: 05/21/16 11:16 PM  Result Value Ref Range Status   Specimen Description BLOOD RIGHT ANTECUBITAL  Final   Special Requests   Final    BOTTLES DRAWN AEROBIC AND ANAEROBIC AER12CC,ANA13CC   Culture NO GROWTH 2 DAYS  Final   Report Status PENDING  Incomplete  Culture, blood (Routine X 2) w Reflex to ID Panel     Status: None (Preliminary result)   Collection Time: 05/21/16 11:25 PM  Result Value Ref Range Status   Specimen Description BLOOD BLOOD LEFT HAND  Final   Special Requests   Final    BOTTLES DRAWN AEROBIC AND ANAEROBIC AER10CC,ANA11CC   Culture NO GROWTH 2 DAYS  Final   Report Status PENDING  Incomplete    RADIOLOGY:  Dg Chest Portable 1 View  Result Date: 05/21/2016 CLINICAL DATA:  Generalized weakness. Productive cough. History of COPD and  diabetes. EXAM: PORTABLE CHEST 1 VIEW COMPARISON:  02/07/2015 and 02/06/2015. FINDINGS: 2001 hour. The heart size and mediastinal contours are stable. There is aortic atherosclerosis. There is mild vascular congestion with interval improved aeration of the lung bases. No edema, confluent airspace opacity, pneumothorax or significant pleural effusion. The bones appear unchanged. IMPRESSION: Interval improved aeration of the lung bases. No acute cardiopulmonary process. Electronically Signed   By: Richardean Sale M.D.   On: 05/21/2016 20:53     Management plans discussed with the patient, family and they are in agreement.  CODE STATUS:     Code Status Orders        Start     Ordered   05/22/16 0049  Do not attempt resuscitation (DNR)  Continuous    Question Answer Comment  In the event of cardiac or respiratory ARREST Do not call a "code blue"   In the event of cardiac or respiratory ARREST Do not perform Intubation, CPR, defibrillation or ACLS   In the event of cardiac or respiratory ARREST Use medication by any route, position, wound care, and other measures to relive pain and suffering. May use oxygen, suction and manual treatment of airway obstruction as needed for comfort.      05/22/16 0048    Code Status History    Date Active Date Inactive Code Status Order ID Comments User Context   02/06/2015  9:55 PM 02/07/2015  8:29 PM DNR [id number]  Bettey Costa, MD Inpatient    Advance Directive Documentation   Flowsheet Row Most Recent Value  Type of Advance Directive  Healthcare Power of [place], Out of facility DNR (pink MOST or yellow form)  Pre-existing out of facility DNR order (yellow form or pink MOST form)  No data  "MOST" Form in Place?  No data      TOTAL TIME TAKING CARE OF THIS PATIENT: 33 minutes.    Kainan Patty,  Karenann Cai.D on 05/23/2016 at 9:44 AM  Between 7am to 6pm - Pager - [phone]  After 6pm go to [email or web address] - password [email or web address]  [facility]  Office  [phone]  CC: Primary care physician; Juluis Pitch, MD

## 2016-05-23 NOTE — NC FL2 (Signed)
[place] LEVEL OF CARE SCREENING TOOL     IDENTIFICATION  Patient Name: Chelsea [name] Birthdate: 1930/11/08 Sex: female Admission Date (Current Location): 05/21/2016  [place] and [place] Number:  Engineering geologist and Address:  [facility], [address] [place], [place] [address]      Provider Number: [id number]  Attending Physician Name and Address:  Lytle Butte, MD  Relative Name and Phone Number:  Dola Factor  [phone]     Current Level of Care: Hospital Recommended Level of Care: [place] Prior Approval Number:    Date Approved/Denied:   PASRR Number: [id number]  Discharge Plan: SNF    Current Diagnoses: Patient Active Problem List   Diagnosis Date Noted  . COPD exacerbation ([place]) 05/21/2016  . Diabetes ([place]) 05/21/2016  . Physical deconditioning 05/21/2016  . Sepsis ([place]) 02/06/2015    Orientation RESPIRATION BLADDER Height & Weight     Time, Self, Situation, Place  Normal Continent Weight: 170 lb (77.1 kg) Height:  5' 2"  (157.5 cm)  BEHAVIORAL SYMPTOMS/MOOD NEUROLOGICAL BOWEL NUTRITION STATUS      Continent Diet (Car[place] Modified)  AMBULATORY STATUS COMMUNICATION OF NEEDS Skin   Limited Assist Verbally Normal                       Personal Care Assistance Level of Assistance  Bathing, Feeding, Dressing Bathing Assistance: Limited assistance Feeding assistance: Independent Dressing Assistance: Limited assistance     Functional Limitations Info  Sight, Hearing, Speech Sight Info: Adequate Hearing Info: Adequate Speech Info: Adequate    SPECIAL CARE FACTORS FREQUENCY  PT (By licensed PT)     PT Frequency: 5x a week              Contractures Contractures Info: Present    Additional Factors Info  Code Status, Allergies, Insulin Sliding Scale, Psychotropic Code Status Info: DNR Allergies Info: LYRICA PREGABALIN  Psychotropic Info: QUEtiapine (SEROQUEL) tablet 25  mg Insulin Sliding Scale Info: insulin aspart (novoLOG) injection 0-9 Units 3x       Current Medications (05/23/2016):  This is the current hospital active medication list Current Facility-Administered Medications  Medication Dose Route Frequency Provider Last Rate Last Dose  . acetaminophen (TYLENOL) tablet 650 mg  650 mg Oral Q6H PRN Lance Coon, MD       Or  . acetaminophen (TYLENOL) suppository 650 mg  650 mg Rectal Q6H PRN Lance Coon, MD      . ALPRAZolam Duanne Moron) tablet 0.125 mg  0.125 mg Oral BID PRN Lytle Butte, MD   0.125 mg at 05/23/16 0819  . diphenhydrAMINE (BENADRYL) capsule 25 mg  25 mg Oral QHS Lance Coon, MD   25 mg at 05/22/16 2209  . enoxaparin (LOVENOX) injection 40 mg  40 mg Subcutaneous Q24H Lance Coon, MD   40 mg at 05/22/16 2209  . gabapentin (NEURONTIN) capsule 900 mg  900 mg Oral QID Lance Coon, MD   900 mg at 05/23/16 1245  . hydrALAZINE (APRESOLINE) injection 10 mg  10 mg Intravenous Q4H PRN Lytle Butte, MD      . insulin aspart (novoLOG) injection 0-5 Units  0-5 Units Subcutaneous QHS Lance Coon, MD   2 Units at 05/22/16 0147  . insulin aspart (novoLOG) injection 0-9 Units  0-9 Units Subcutaneous TID WC Lance Coon, MD   2 Units at 05/23/16 1245  . ipratropium-albuterol (DUONEB) 0.5-2.5 (3) MG/3ML nebulizer solution 3 mL  3 mL Nebulization Q4H  PRN Lance Coon, MD      . levofloxacin [facility]) tablet 750 mg  750 mg Oral Daily Lance Coon, MD   750 mg at 05/22/16 1719  . losartan (COZAAR) tablet 100 mg  100 mg Oral Daily Lance Coon, MD   100 mg at 05/23/16 0818  . metoprolol tartrate (LOPRESSOR) tablet 25 mg  25 mg Oral BID Lance Coon, MD   25 mg at 05/23/16 0818  . mometasone-formoterol (DULERA) 200-5 MCG/ACT inhaler 2 puff  2 puff Inhalation BID Lance Coon, MD   2 puff at 05/23/16 [phone]  . ondansetron (ZOFRAN) tablet 4 mg  4 mg Oral Q6H PRN Lance Coon, MD       Or  . ondansetron [facility]) injection 4 mg  4 mg Intravenous Q6H PRN Lance Coon, MD      . pantoprazole (PROTONIX) EC tablet 40 mg  40 mg Oral Daily Lance Coon, MD   40 mg at 05/23/16 0818  . predniSONE (DELTASONE) tablet 40 mg  40 mg Oral Q breakfast Lance Coon, MD   40 mg at 05/23/16 0818  . QUEtiapine (SEROQUEL) tablet 25 mg  25 mg Oral QHS Lance Coon, MD   25 mg at 05/22/16 2209  . sodium chloride flush (NS) 0.9 % injection 3 mL  3 mL Intravenous Q12H Lance Coon, MD   3 mL at 05/23/16 0818     Discharge Medications: Please see discharge summary for a list of discharge medications.  Relevant Imaging Results:  Relevant Lab Results:   Additional Information SSN [id number]  Ross Ludwig, [place]

## 2016-05-23 NOTE — Progress Notes (Signed)
During telemetry report, Kiona at [facility] notified me that patient had 5 beat run svt at 0745am this morning- strip saved.   Dr. Lavetta Nielsen notified. No new orders. Patient stable and to be discharged to SNF today.

## 2016-05-23 NOTE — Clinical Social Work Note (Signed)
Patient to be d/c'ed today to [facility].  Patient and family agreeable to plans will transport via daughter's car RN to call report to [phone].  Evette Cristal, MSW, LCSWA Mon-Fri 8a-4:30p [phone]

## 2016-05-23 NOTE — Clinical Social Work Placement (Signed)
   CLINICAL SOCIAL WORK PLACEMENT  NOTE  Date:  05/23/2016  Patient Details  Name: Chelsea [name] MRN: [id number] Date of Birth: [birth year]  Clinical Social Work is seeking post-discharge placement for this patient at the [place] level of care (*CSW will initial, date and re-position this form in  chart as items are completed):  Yes   Patient/family provided with [place] Work Department's list of facilities offering this level of care within the geographic area requested by the patient (or if unable, by the patient's family).  Yes   Patient/family informed of their freedom to choose among providers that offer the needed level of care, that participate in Medicare, Medicaid or managed care program needed by the patient, have an available bed and are willing to accept the patient.  Yes   Patient/family informed of [facility]'s ownership interest in [facility] and [facility], as well as of the fact that they are under no obligation to receive care at these facilities.  PASRR submitted to EDS on 05/23/16     PASRR number received on       Existing PASRR number confirmed on 05/23/16     FL2 transmitted to all facilities in geographic area requested by pt/family on 05/23/16     FL2 transmitted to all facilities within larger geographic area on       Patient informed that his/her managed care company has contracts with or will negotiate with certain facilities, including the following:        Yes   Patient/family informed of bed offers received.  Patient chooses bed at [facility]     Physician recommends and patient chooses bed at      Patient to be transferred to [facility] on 05/23/16.  Patient to be transferred to facility by Paitent's daughter to transport.     Patient family notified on 05/23/16 of transfer.  Name of family member notified:  Daughter Dola Factor     PHYSICIAN Please sign  FL2, Please sign DNR     Additional Comment:    _______________________________________________ Ross Ludwig, LCSWA 05/23/2016, 3:52 PM

## 2016-05-23 NOTE — Progress Notes (Signed)
While rounding, [place] made initial visit to room 111. Pt was awake and alert. Pt indicated that she was feeling much better than yesterday. Our visit was interrupted by a phone call. I will attempt a follow up tomorrow.    05/23/16 1500  Clinical Encounter Type  Visited With Patient  Visit Type Initial;Spiritual support  Referral From Nurse

## 2016-05-23 NOTE — Progress Notes (Signed)
Discharge Report called to Lattie Haw at [facility] [phone]). Patient going to room 509. Patient's daughter to transport.

## 2016-05-23 NOTE — Progress Notes (Signed)
Checked to see if pre-authorization would be needed for non-emergent EMS transport. Per [employer] benefits obtained online through [employer], patient has an [employer] PPO policy.  Medicare PPO plans do not require pre-auth for non-emergent ground transports using service codes [phone] or [phone].

## 2016-05-23 NOTE — Clinical Social Work Note (Signed)
Clinical Social Work Assessment  Patient Details  Name: Chelsea [name] MRN: [id number] Date of Birth: Aug 13, 1930  Date of referral:  05/23/16               Reason for consult:  Facility Placement                Permission sought to share information with:  Family Supports Permission granted to share information::  Yes, Verbal Permission Granted  Name::     Dola Factor  [phone]   Agency::  SNF admissions  Relationship::     Contact Information:     Housing/Transportation Living arrangements for the past 2 months:  [place] of Information:  Patient, Adult Children Patient Interpreter Needed:  None Criminal Activity/Legal Involvement Pertinent to Current Situation/Hospitalization:  No - Comment as needed Significant Relationships:  Adult Children Lives with:  Adult Children Do you feel safe going back to the place where you live?  No Need for family participation in patient care:  Yes (Comment) (Patient requests to speak with her daughter)  Care giving concerns:  Patient and her daughter feel she need some short term rehab before she is able to return back home.   Social Worker assessment / plan:  Patient is a 82 year old female who lives with her daughter.  Patient is alert and oriented x3 and able to express her feelings.  Patient states she has not been to rehab before, CSW explained to patient what the process is and what to expect at SNF.  CSW explained to patient and her daughter how insurance will pay for her stay.  CSW explained to patient what the process is for SNF bed search.  CSW was given permission to begin bed search for SNF.  Employment status:  Retired Nurse, adult PT Recommendations:  [place] / Referral to community resources:  [place]  Patient/Family's Response to care:  Patient and family agreeable to going to SNF for short term rehab.  Patient/Family's  Understanding of and Emotional Response to Diagnosis, Current Treatment, and Prognosis:  [place] and family aware of current treatment plan and prognosis.  Emotional Assessment Appearance:  Appears stated age Attitude/Demeanor/Rapport:    Affect (typically observed):  Appropriate Orientation:  Oriented to Place, Oriented to Self, Oriented to Situation Alcohol / Substance use:  Not Applicable Psych involvement (Current and /or in the community):  No (Comment)  Discharge Needs  Concerns to be addressed:  Lack of Support Readmission within the last 30 days:  No Current discharge risk:  Lack of support system Barriers to Discharge:  No Barriers Identified   Ross Ludwig, LCSWA 05/23/2016, 3:32 PM

## 2016-05-26 LAB — CULTURE, BLOOD (ROUTINE X 2)
Culture: NO GROWTH
Culture: NO GROWTH

## 2017-04-14 ENCOUNTER — Encounter: Payer: Self-pay | Admitting: Emergency Medicine

## 2017-04-14 ENCOUNTER — Emergency Department
Admission: EM | Admit: 2017-04-14 | Discharge: 2017-04-14 | Disposition: A | Discharge status: Home / Self Care | Payer: Medicare Other | Attending: Emergency Medicine | Admitting: Emergency Medicine

## 2017-04-14 ENCOUNTER — Emergency Department: Payer: Medicare Other

## 2017-04-14 ENCOUNTER — Other Ambulatory Visit: Payer: Self-pay

## 2017-04-14 DIAGNOSIS — E119 Type 2 diabetes mellitus without complications: Secondary | ICD-10-CM | POA: Diagnosis not present

## 2017-04-14 DIAGNOSIS — M6281 Muscle weakness (generalized): Secondary | ICD-10-CM | POA: Diagnosis present

## 2017-04-14 DIAGNOSIS — J449 Chronic obstructive pulmonary disease, unspecified: Secondary | ICD-10-CM | POA: Insufficient documentation

## 2017-04-14 DIAGNOSIS — Z87891 Personal history of nicotine dependence: Secondary | ICD-10-CM | POA: Diagnosis not present

## 2017-04-14 DIAGNOSIS — Z79891 Long term (current) use of opiate analgesic: Secondary | ICD-10-CM | POA: Insufficient documentation

## 2017-04-14 DIAGNOSIS — N390 Urinary tract infection, site not specified: Secondary | ICD-10-CM | POA: Insufficient documentation

## 2017-04-14 DIAGNOSIS — Z79899 Other long term (current) drug therapy: Secondary | ICD-10-CM | POA: Insufficient documentation

## 2017-04-14 DIAGNOSIS — R531 Weakness: Secondary | ICD-10-CM

## 2017-04-14 HISTORY — DX: Polyneuropathy, unspecified: G62.9

## 2017-04-14 LAB — URINALYSIS, COMPLETE (UACMP) WITH MICROSCOPIC
BACTERIA UA: NONE SEEN
Bilirubin Urine: NEGATIVE
Glucose, UA: NEGATIVE mg/dL
Hgb urine dipstick: NEGATIVE
Ketones, ur: NEGATIVE mg/dL
NITRITE: NEGATIVE
Protein, ur: 100 mg/dL — AB
SPECIFIC GRAVITY, URINE: 1.012 (ref 1.005–1.030)
pH: 6 (ref 5.0–8.0)

## 2017-04-14 LAB — CBC WITH DIFFERENTIAL/PLATELET
BASOS ABS: 0.1 10*3/uL (ref 0–0.1)
BASOS PCT: 1 %
EOS ABS: 0.2 10*3/uL (ref 0–0.7)
Eosinophils Relative: 3 %
HCT: 40.8 % (ref 35.0–47.0)
HEMOGLOBIN: 13.7 g/dL (ref 12.0–16.0)
Lymphocytes Relative: 16 %
Lymphs Abs: 1.2 10*3/uL (ref 1.0–3.6)
MCH: 32.2 pg (ref 26.0–34.0)
MCHC: 33.5 g/dL (ref 32.0–36.0)
MCV: 96.1 fL (ref 80.0–100.0)
Monocytes Absolute: 0.8 10*3/uL (ref 0.2–0.9)
Monocytes Relative: 11 %
NEUTROS PCT: 69 %
Neutro Abs: 5.1 10*3/uL (ref 1.4–6.5)
Platelets: 329 10*3/uL (ref 150–440)
RBC: 4.24 MIL/uL (ref 3.80–5.20)
RDW: 13.3 % (ref 11.5–14.5)
WBC: 7.4 10*3/uL (ref 3.6–11.0)

## 2017-04-14 LAB — BASIC METABOLIC PANEL
Anion gap: 11 (ref 5–15)
BUN: 16 mg/dL (ref 6–20)
CHLORIDE: 96 mmol/L — AB (ref 101–111)
CO2: 24 mmol/L (ref 22–32)
Calcium: 10 mg/dL (ref 8.9–10.3)
Creatinine, Ser: 0.84 mg/dL (ref 0.44–1.00)
Glucose, Bld: 218 mg/dL — ABNORMAL HIGH (ref 65–99)
POTASSIUM: 4.1 mmol/L (ref 3.5–5.1)
SODIUM: 131 mmol/L — AB (ref 135–145)

## 2017-04-14 LAB — BLOOD GAS, VENOUS
PATIENT TEMPERATURE: 37
PCO2 VEN: 46 mmHg (ref 44.0–60.0)
PH VEN: 7.41 (ref 7.250–7.430)
pO2, Ven: 51 mmHg — ABNORMAL HIGH (ref 32.0–45.0)

## 2017-04-14 MED ORDER — CEPHALEXIN 500 MG PO CAPS: 500.0000 mg | ORAL_CAPSULE | Freq: Two times a day (BID) | ORAL | 0 refills | Status: AC

## 2017-04-14 MED ORDER — METOPROLOL TARTRATE 25 MG PO TABS
25.0000 mg | ORAL_TABLET | Freq: Once | ORAL | Status: AC
  Administered 2017-04-14: 25 mg via ORAL
  Filled 2017-04-14: qty 1

## 2017-04-14 MED ORDER — CEFTRIAXONE SODIUM IN DEXTROSE 20 MG/ML IV SOLN
1.0000 g | Freq: Once | INTRAVENOUS | Status: AC
  Administered 2017-04-14: 1 g via INTRAVENOUS
  Filled 2017-04-14 (×3): qty 50

## 2017-04-14 NOTE — ED Notes (Signed)
Pt/daughter understands still waiting for medication from pharmacy

## 2017-04-14 NOTE — ED Notes (Signed)
Pt transported to MRI 

## 2017-04-14 NOTE — ED Provider Notes (Addendum)
[facility] Emergency Department Provider Note ____________________________________________   First MD Initiated Contact with Patient 04/14/17 1625     (approximate)  I have reviewed the triage vital signs and the nursing notes.   HISTORY  Chief Complaint Leg Pain    HPI Chelsea [name] is a 83 y.o. female with a past history of COPD, diabetes, neuropathy who presents with primarily lower extremity weakness, gradual onset over the last several months, worse in the last few weeks, and especially in the last several days with several resulting falls and inability to get up out of a chair.  Patient and her daughter, the weakness involves both lower extremities equally.  She does report some pain to her legs from neuropathy, but there is no acute change in this.  No swelling.  No rash or redness.  Per daughter, patient has diabetic neuropathy, but no history of spinal stenosis or other spinal problems.  Past Medical History:  Diagnosis Date  . COPD (chronic obstructive pulmonary disease) ([place])   . Diabetes mellitus without complication ([place])   . Neuropathy     Patient Active Problem List   Diagnosis Date Noted  . COPD exacerbation ([place]) 05/21/2016  . Diabetes ([place]) 05/21/2016  . Physical deconditioning 05/21/2016  . Sepsis ([place]) 02/06/2015    Past Surgical History:  Procedure Laterality Date  . ABDOMINAL HYSTERECTOMY    . APPENDECTOMY    . EYE SURGERY      Prior to Admission medications   Medication Sig Start Date End Date Taking? Authorizing Provider  ALPRAZolam (XANAX) 0.25 MG tablet Take 0.5 tablets (0.125 mg total) by mouth 2 (two) times daily as needed for anxiety. 05/23/16   Hower, Aaron Mose, MD  calcium citrate-vitamin D (CITRACAL+D) 315-200 MG-UNIT per tablet Take 1 tablet by mouth 2 (two) times daily.    [provider]  diphenhydrAMINE (SOMINEX) 25 MG tablet Take 40 mg by mouth at bedtime.     [provider]    Fluticasone-Salmeterol (ADVAIR) 250-50 MCG/DOSE AEPB Inhale 1 puff into the lungs 2 (two) times daily.    [provider]  gabapentin (NEURONTIN) 600 MG tablet Take 900 mg by mouth 4 (four) times daily.    [provider]  ipratropium-albuterol (DUONEB) 0.5-2.5 (3) MG/3ML SOLN Take 3 mLs by nebulization every 4 (four) hours as needed. 05/23/16   Hower, Aaron Mose, MD  levofloxacin (LEVAQUIN) 750 MG tablet Take 1 tablet (750 mg total) by mouth daily. 05/23/16   Hower, Aaron Mose, MD  loratadine (CLARITIN) 10 MG tablet Take 10 mg by mouth every morning.    [provider]  losartan (COZAAR) 100 MG tablet Take 100 mg by mouth daily.    [provider]  metoprolol tartrate (LOPRESSOR) 25 MG tablet Take 25 mg by mouth 2 (two) times daily.    [provider]  naproxen (NAPROSYN) 500 MG tablet Take 500 mg by mouth 2 (two) times daily with a meal.    [provider]  omeprazole (PRILOSEC OTC) 20 MG tablet Take 20 mg by mouth every morning.    [provider]  predniSONE (DELTASONE) 10 MG tablet 53mx1 day, 260mx2 day, 1036m2 day then stop 05/23/16   Hower, DavAaron MoseD  Psyllium (METAMUCIL PO) Take 500 mg by mouth 2 (two) times daily.    [provider]  QUEtiapine (SEROQUEL) 25 MG tablet Take 25 mg by mouth at bedtime.    [provider]  sulfaSALAzine (AZULFIDINE) 500 MG  tablet Take 1,000 mg by mouth 2 (two) times daily.    [provider]    Allergies Lyrica [pregabalin]  Family History  Problem Relation Age of Onset  . Diabetes Mother   . Liver cancer Father     Social History Social History   Tobacco Use  . Smoking status: Former Research scientist (life sciences)  . Smokeless tobacco: Never Used  Substance Use Topics  . Alcohol use: No  . Drug use: No    Review of Systems  Constitutional: No fever/chills. Eyes: No visual changes. ENT: No sore throat. Cardiovascular: Denies chest pain. Respiratory: Denies shortness of  breath. Gastrointestinal: No nausea, no vomiting.  No diarrhea.  Genitourinary: Negative for dysuria.  Musculoskeletal: Negative for back pain. Skin: Negative for rash. Neurological: Negative for headache.   ____________________________________________   PHYSICAL EXAM:  VITAL SIGNS: ED Triage Vitals  Enc Vitals Group     BP 04/14/17 1407 (!) 189/104     Pulse Rate 04/14/17 1407 (!) 109     Resp 04/14/17 1407 16     Temp 04/14/17 1407 98.2 F (36.8 C)     Temp Source 04/14/17 1407 Oral     SpO2 04/14/17 1407 90 %     Weight 04/14/17 1401 170 lb (77.1 kg)     Height --      Head Circumference --      Peak Flow --      Pain Score 04/14/17 1400 8     Pain Loc --      Pain Edu? --      Excl. in [place]? --     Constitutional: Alert and oriented. Well appearing and in no acute distress. Eyes: Conjunctivae are normal.  Head: Atraumatic. Nose: No congestion/rhinnorhea. Mouth/Throat: Mucous membranes are moist.   Neck: Normal range of motion.  Cardiovascular: Normal rate, regular rhythm.  Good peripheral circulation. Respiratory: Normal respiratory effort.  No retractions.  Gastrointestinal: Soft and nontender. No distention.  Genitourinary: No flank tenderness. Musculoskeletal: No lower extremity edema.  Extremities warm and well perfused.  2+ DP pulses bilat.  FROM at bilat ankles, knees and hips.   Neurologic:  Normal speech and language.  5/5 motor strength to RLE proximally, approx 4/5 to LLE.  Bilateral ? mild foot drop with slightly decreased strength in plantar flexion.  Subjective mild dec sensation to bilat LEs distally.  Skin:  Skin is warm and dry. No rash noted.  Subacute appearing ecchymosis R knee.  Psychiatric: Mood and affect are normal. Speech and behavior are normal.  ____________________________________________   LABS (all labs ordered are listed, but only abnormal results are displayed)  Labs Reviewed  BASIC METABOLIC PANEL - Abnormal; Notable for the  following components:      Result Value   Sodium 131 (*)    Chloride 96 (*)    Glucose, Bld 218 (*)    All other components within normal limits  URINALYSIS, COMPLETE (UACMP) WITH MICROSCOPIC - Abnormal; Notable for the following components:   Color, Urine YELLOW (*)    APPearance CLEAR (*)    Protein, ur 100 (*)    Leukocytes, UA TRACE (*)    Squamous Epithelial / LPF 0-5 (*)    All other components within normal limits  BLOOD GAS, VENOUS - Abnormal; Notable for the following components:   pO2, Ven 51.0 (*)    All other components within normal limits  CBC WITH DIFFERENTIAL/PLATELET   ____________________________________________  EKG   ____________________________________________  RADIOLOGY  XR R knee:  no acute fracture MR L spine: no acute spinal cord or foraminal findings ____________________________________________   PROCEDURES  Procedure(s) performed: No    Critical Care performed: No ____________________________________________   INITIAL IMPRESSION / ASSESSMENT AND PLAN / ED COURSE  Pertinent labs & imaging results that were available during my care of the patient were reviewed by me and considered in my medical decision making (see chart for details).  83 year old female with past medical history as noted above presents with worsening bilateral lower extremity weakness and increased difficulty getting up out of bed, out of the chair, or otherwise moving around her home.  Her daughter, she is able to ambulate a little bit with a walker but places a lot of pressure on the walker and it is very difficult for her.  Patient denies any significant generalized symptoms.  There is no recent significant trauma, but she did injure and bruise the right knee.  She does have full-time nursing care at home, and recently had ended physical therapy at home but arrangements are being made to have it restarted.  Review of past medical records in Epic is  noncontributory.  Exam is as described; patient is relatively well-appearing, slightly tachycardic but with otherwise normal vital signs, and lower extremity exam is as noted with bilateral mild weakness especially distally.  Differential includes peripheral neuropathy related to diabetes, chronic nonspecific deconditioning and muscle wasting, versus less likely but possible subacute spinal lesion.  Given the distal nature of the symptoms, this would be most likely in the L-spine.  Also consider increased weakness due to patient's chronic medical conditions, or acute cause such as electrolyte abnormality or UTI.  Plan: Basic labs, UA, VBG to r/o hypercapnea, XR R knee to r/o fx, and MR L spine.      ----------------------------------------- 7:41 PM on 04/14/2017 -----------------------------------------  Patient's lab workup is unremarkable, and her CO2 is within normal range.  Analysis is consistent with a UTI.  This may be explaining some of patient's increased weakness in the last several days.  XR of the knee is negative, and the MRI shows extensive chronic degenerative changes and spinal and foraminal stenosis, but no acute impingement on the cord or other acute neurologic findings.    I explained the findings to patient and her family member, and spent approximately 10 minutes discussing plan with them.  Given that there is no acute abnormality in the spinal cord, no indication for intervention.  Patient states she feels much better, and would like to go home.  Patient's daughter states that she is safe at home given her current level of care, and again is scheduled for physical therapy to be resumed at this week.  The patient and her daughter both agree with discharge home with antibiotics for the UTI, and follow-up with her primary care and physical therapy at home.  Return precautions given, and patient and family member expressed  understanding.  ____________________________________________   FINAL CLINICAL IMPRESSION(S) / ED DIAGNOSES  Final diagnoses:  Urinary tract infection without hematuria, site unspecified  Weakness      NEW MEDICATIONS STARTED DURING THIS VISIT:  This SmartLink is deprecated. Use AVSMEDLIST instead to display the medication list for a patient.   Note:  This document was prepared using Dragon voice recognition software and may include unintentional dictation errors.    Arta Silence, MD 04/14/17 1944    Arta Silence, MD 04/14/17 1947

## 2017-04-14 NOTE — ED Notes (Signed)
Pharmacy called for antibiotics not in pyxis; pt given sandwich tray and diet soda as allowed by MD;

## 2017-04-14 NOTE — Discharge Instructions (Signed)
Take the antibiotic as prescribed, and finish the full course.  Follow-up with your primary care doctor.  You should resume physical therapy at home as discussed.  Return to the ER immediately for new or worsening weakness, new or worsening difficulty walking, numbness, urinary incontinence, or any other new or worsening symptoms that concern you.

## 2017-04-14 NOTE — ED Notes (Signed)
Dr Cherylann Banas in to follow up with patient and daughter

## 2017-04-14 NOTE — ED Notes (Signed)
Pharmacy called again for ordered medication not available in pyxis

## 2017-04-14 NOTE — ED Triage Notes (Signed)
Pt to ED with her daughter c/o bilateral leg and foot pain. Pt dtr states that over the past 2 weeks pt has been having more difficulty ambulating. Pt is having increased fall due to the pain in her feet and legs. Pt has hx/o neuropathy. Pt is in NAD at the time.

## 2017-04-14 NOTE — ED Notes (Signed)
Pt voided in bedpan but some spilled out onto bed; pt cleaned well; clean linens in place; pt given warm blanket and pillow for comfort

## 2017-12-09 IMAGING — DX DG CHEST 1V PORT
1 series · 1 of 1 positions shown · non-contrast
Comparison: 02/07/2015 and 02/06/2015.

CLINICAL DATA: Generalized weakness. Productive cough. History of
COPD and diabetes.

EXAM:
PORTABLE CHEST 1 VIEW

[chest ap]
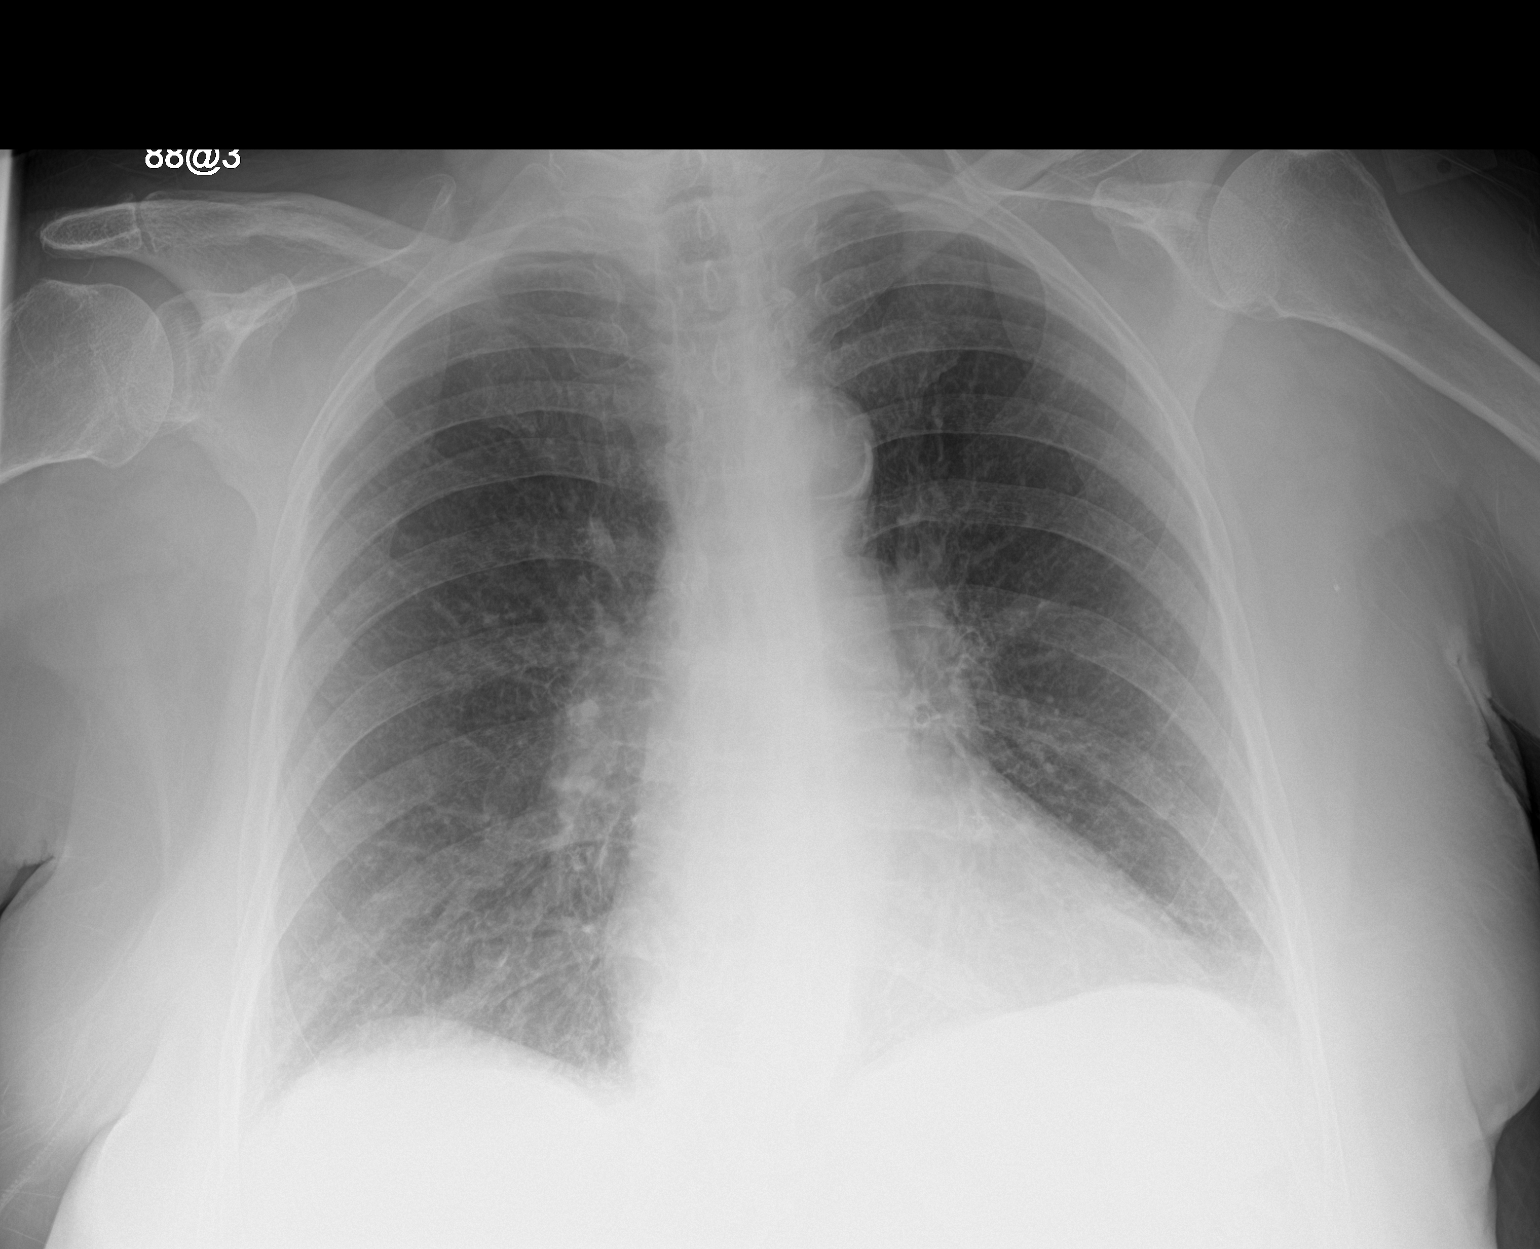

[1 of 1 positions shown; findings below may reference images not displayed]

FINDINGS: 9998 hour. The heart size and mediastinal contours are stable. There
is aortic atherosclerosis. There is mild vascular congestion with
interval improved aeration of the lung bases. No edema, confluent
airspace opacity, pneumothorax or significant pleural effusion. The
bones appear unchanged.
IMPRESSION: Interval improved aeration of the lung bases. No acute
cardiopulmonary process.

## 2018-02-19 ENCOUNTER — Other Ambulatory Visit: Payer: Self-pay

## 2018-02-19 ENCOUNTER — Emergency Department
Admission: EM | Admit: 2018-02-19 | Discharge: 2018-02-20 | Disposition: A | Discharge status: Home / Self Care | Payer: Medicare Other | Attending: Emergency Medicine | Admitting: Emergency Medicine

## 2018-02-19 ENCOUNTER — Emergency Department: Payer: Medicare Other

## 2018-02-19 DIAGNOSIS — R5383 Other fatigue: Secondary | ICD-10-CM | POA: Diagnosis present

## 2018-02-19 DIAGNOSIS — J449 Chronic obstructive pulmonary disease, unspecified: Secondary | ICD-10-CM | POA: Insufficient documentation

## 2018-02-19 DIAGNOSIS — Z9981 Dependence on supplemental oxygen: Secondary | ICD-10-CM | POA: Diagnosis not present

## 2018-02-19 DIAGNOSIS — E119 Type 2 diabetes mellitus without complications: Secondary | ICD-10-CM | POA: Insufficient documentation

## 2018-02-19 DIAGNOSIS — R531 Weakness: Secondary | ICD-10-CM

## 2018-02-19 DIAGNOSIS — Z87891 Personal history of nicotine dependence: Secondary | ICD-10-CM | POA: Diagnosis not present

## 2018-02-19 HISTORY — DX: Essential (primary) hypertension: I10

## 2018-02-19 LAB — CBC
HEMATOCRIT: 37.4 % (ref 35.0–47.0)
Hemoglobin: 13 g/dL (ref 12.0–16.0)
MCH: 31.9 pg (ref 26.0–34.0)
MCHC: 34.8 g/dL (ref 32.0–36.0)
MCV: 91.8 fL (ref 80.0–100.0)
Platelets: 479 10*3/uL — ABNORMAL HIGH (ref 150–440)
RBC: 4.08 MIL/uL (ref 3.80–5.20)
RDW: 12.4 % (ref 11.5–14.5)
WBC: 11.2 10*3/uL — ABNORMAL HIGH (ref 3.6–11.0)

## 2018-02-19 LAB — BASIC METABOLIC PANEL
Anion gap: 9 (ref 5–15)
BUN: 19 mg/dL (ref 8–23)
CO2: 28 mmol/L (ref 22–32)
Calcium: 9.2 mg/dL (ref 8.9–10.3)
Chloride: 96 mmol/L — ABNORMAL LOW (ref 98–111)
Creatinine, Ser: 1.01 mg/dL — ABNORMAL HIGH (ref 0.44–1.00)
GFR calc Af Amer: 57 mL/min — ABNORMAL LOW (ref >60)
GFR calc non Af Amer: 49 mL/min — ABNORMAL LOW (ref >60)
Glucose, Bld: 198 mg/dL — ABNORMAL HIGH (ref 70–99)
Potassium: 4.5 mmol/L (ref 3.5–5.1)
Sodium: 133 mmol/L — ABNORMAL LOW (ref 135–145)

## 2018-02-19 MED ORDER — SODIUM CHLORIDE 0.9 % IV BOLUS
1000.0000 mL | Freq: Once | INTRAVENOUS | Status: AC
  Administered 2018-02-19: 1000 mL via INTRAVENOUS

## 2018-02-19 NOTE — ED Triage Notes (Signed)
Pt arrives to ed via POV with daughter. Daughter states she is POA, pt advanced dementia. Daughter states she has been progressively getting weaker over the last three months with increase in last 3 weeks. Pt unable to stand but been in wheel chair for 1 year with transferes only. O2 sat normal daughter states is 88-90%. NAD noted at this time

## 2018-02-20 ENCOUNTER — Encounter
Admission: RE | Admit: 2018-02-20 | Discharge: 2018-02-20 | Disposition: A | Discharge status: Home / Self Care | Payer: Medicare Other | Source: Ambulatory Visit | Attending: Internal Medicine | Admitting: Internal Medicine

## 2018-02-20 ENCOUNTER — Encounter: Payer: Self-pay | Admitting: Emergency Medicine

## 2018-02-20 LAB — URINALYSIS, COMPLETE (UACMP) WITH MICROSCOPIC
Bilirubin Urine: NEGATIVE
GLUCOSE, UA: NEGATIVE mg/dL
HGB URINE DIPSTICK: NEGATIVE
KETONES UR: NEGATIVE mg/dL
LEUKOCYTES UA: NEGATIVE
NITRITE: NEGATIVE
PROTEIN: 100 mg/dL — AB
Specific Gravity, Urine: 1.014 (ref 1.005–1.030)
pH: 5 (ref 5.0–8.0)

## 2018-02-20 LAB — BLOOD GAS, VENOUS
Acid-Base Excess: 1.4 mmol/L (ref 0.0–2.0)
BICARBONATE: 27.7 mmol/L (ref 20.0–28.0)
O2 Saturation: 62.6 %
PH VEN: 7.36 (ref 7.250–7.430)
Patient temperature: 37
pCO2, Ven: 49 mmHg (ref 44.0–60.0)
pO2, Ven: 34 mmHg (ref 32.0–45.0)

## 2018-02-20 LAB — BRAIN NATRIURETIC PEPTIDE: B NATRIURETIC PEPTIDE 5: 105 pg/mL — AB (ref 0.0–100.0)

## 2018-02-20 MED ORDER — METOPROLOL TARTRATE 25 MG PO TABS
25.0000 mg | ORAL_TABLET | Freq: Two times a day (BID) | ORAL | Status: DC
  Administered 2018-02-20 (×2): 25 mg via ORAL
  Filled 2018-02-20 (×2): qty 1

## 2018-02-20 MED ORDER — QUETIAPINE FUMARATE 25 MG PO TABS
25.0000 mg | ORAL_TABLET | Freq: Every day | ORAL | Status: DC
  Administered 2018-02-20: 25 mg via ORAL
  Filled 2018-02-20: qty 1

## 2018-02-20 MED ORDER — SODIUM CHLORIDE 0.9 % IV SOLN
1.0000 g | Freq: Once | INTRAVENOUS | Status: AC
  Administered 2018-02-20: 1 g via INTRAVENOUS
  Filled 2018-02-20: qty 10

## 2018-02-20 MED ORDER — MOMETASONE FURO-FORMOTEROL FUM 200-5 MCG/ACT IN AERO: 2.0000 | INHALATION_SPRAY | Freq: Two times a day (BID) | RESPIRATORY_TRACT | Status: DC

## 2018-02-20 NOTE — Progress Notes (Signed)
Per [facility] admissions coordinator at [facility] SNF authorization has been received and patient can come today to room 201.  [employer], LCSW [phone]

## 2018-02-20 NOTE — ED Notes (Signed)
Secretary notified to set up EMS transportation to [facility]

## 2018-02-20 NOTE — Progress Notes (Signed)
LCSW faxed over AVS to [facility] from [place] patient will go to [facility] once authorization in follow up with Lovena Le.  Hillel Card LCSW

## 2018-02-20 NOTE — Discharge Instructions (Addendum)
Return to the emergency department if you develop severe pain, lightheadedness or fainting, fever, or any other symptoms concerning to you.

## 2018-02-20 NOTE — ED Provider Notes (Signed)
[facility] Emergency Department Provider Note  ____________________________________________   I have reviewed the triage vital signs and the nursing notes. Where available I have reviewed prior notes and, if possible and indicated, outside hospital notes.    HISTORY  Chief Complaint Weakness    HPI Chelsea [name] is a 84 y.o. female  Who presents here today complaining of feeling generally weak.  Patient has a chronic history of generalized weakness, COPD, and who is on home oxygen.  Patient has been feeling generally weaker over the last several months she is wheelchair-bound, family is concerned that over the last couple days she has been even more weak than normal.  She is eating and drinking, no vomiting no diarrhea but they are worried she might have a urinary tract infection.  They do not want her to be back at her baseline of a year ago but they do want to make sure that there is no acute problem today.  Patient herself has no complaints.  She is somewhat limited in her history.    Past Medical History:  Diagnosis Date  . COPD (chronic obstructive pulmonary disease) ([place])   . Diabetes mellitus without complication ([place])   . Neuropathy     Patient Active Problem List   Diagnosis Date Noted  . COPD exacerbation ([place]) 05/21/2016  . Diabetes ([place]) 05/21/2016  . Physical deconditioning 05/21/2016  . Sepsis ([place]) 02/06/2015    Past Surgical History:  Procedure Laterality Date  . ABDOMINAL HYSTERECTOMY    . APPENDECTOMY    . EYE SURGERY      Prior to Admission medications   Medication Sig Start Date End Date Taking? Authorizing Provider  ALPRAZolam (XANAX) 0.25 MG tablet Take 0.5 tablets (0.125 mg total) by mouth 2 (two) times daily as needed for anxiety. 05/23/16   Hower, Aaron Mose, MD  calcium citrate-vitamin D (CITRACAL+D) 315-200 MG-UNIT per tablet Take 1 tablet by mouth 2 (two) times daily.    [provider]  diphenhydrAMINE  (SOMINEX) 25 MG tablet Take 40 mg by mouth at bedtime.     [provider]  Fluticasone-Salmeterol (ADVAIR) 250-50 MCG/DOSE AEPB Inhale 2 puffs 2 (two) times daily into the lungs.     [provider]  gabapentin (NEURONTIN) 600 MG tablet Take 900 mg by mouth 4 (four) times daily.    [provider]  loratadine (CLARITIN) 10 MG tablet Take 10 mg by mouth every morning.    [provider]  losartan (COZAAR) 100 MG tablet Take 100 mg by mouth daily.    [provider]  metoprolol tartrate (LOPRESSOR) 25 MG tablet Take 25 mg by mouth 2 (two) times daily.    [provider]  naproxen (NAPROSYN) 500 MG tablet Take 500 mg by mouth 2 (two) times daily with a meal.    [provider]  omeprazole (PRILOSEC OTC) 20 MG tablet Take 20 mg by mouth every morning.    [provider]  Psyllium (METAMUCIL PO) Take 500 mg by mouth 2 (two) times daily.    [provider]  QUEtiapine (SEROQUEL) 25 MG tablet Take 25 mg by mouth at bedtime.    [provider]  sulfaSALAzine (AZULFIDINE) 500 MG tablet Take 1,000 mg by mouth 2 (two) times daily.    [provider]    Allergies Lyrica [pregabalin]  Family History  Problem Relation Age of Onset  . Diabetes Mother   . Liver cancer Father     Social History Social  History   Tobacco Use  . Smoking status: Former Research scientist (life sciences)  . Smokeless tobacco: Never Used  Substance Use Topics  . Alcohol use: No  . Drug use: No    Review of Systems Constitutional: No fever/chills Eyes: No visual changes. ENT: No sore throat. No stiff neck no neck pain Cardiovascular: Denies chest pain. Respiratory: Denies shortness of breath. Gastrointestinal:   no vomiting.  No diarrhea.  No constipation. Genitourinary: Negative for dysuria. Musculoskeletal: Negative lower extremity swelling Skin: Negative for rash. Neurological: Negative for severe headaches, focal weakness or  numbness.   ____________________________________________   PHYSICAL EXAM:  VITAL SIGNS: ED Triage Vitals  Enc Vitals Group     BP 02/19/18 1853 125/62     Pulse Rate 02/19/18 1853 (!) 101     Resp --      Temp 02/19/18 1853 98.5 F (36.9 C)     Temp Source 02/19/18 1853 Oral     SpO2 02/19/18 1853 90 %     Weight 02/19/18 1855 187 lb (84.8 kg)     Height 02/19/18 1855 5' 2"  (1.575 m)     Head Circumference --      Peak Flow --      Pain Score --      Pain Loc --      Pain Edu? --      Excl. in [place]? --     Constitutional: Alert and oriented demented in no acute distress elderly woman resting comfortably in the bed Eyes: Conjunctivae are normal Head: Atraumatic HEENT: No congestion/rhinnorhea. Mucous membranes are dry.  Oropharynx non-erythematous Neck:   Nontender with no meningismus, no masses, no stridor Cardiovascular: Normal rate, regular rhythm. Grossly normal heart sounds.  Good peripheral circulation. Respiratory: Normal respiratory effort.  No retractions. Lungs CTAB. Abdominal: Soft and nontender. No distention. No guarding no rebound Back:  There is no focal tenderness or step off.  there is no midline tenderness there are no lesions noted. there is no CVA tenderness Musculoskeletal: No lower extremity tenderness, no upper extremity tenderness. No joint effusions, no DVT signs strong distal pulses no edema Neurologic:  Normal speech and language. No gross focal neurologic deficits are appreciated.  Skin:  Skin is warm, dry and intact. No rash noted. Psychiatric: Mood and affect are normal. Speech and behavior are normal.  ____________________________________________   LABS (all labs ordered are listed, but only abnormal results are displayed)  Labs Reviewed  BASIC METABOLIC PANEL - Abnormal; Notable for the following components:      Result Value   Sodium 133 (*)    Chloride 96 (*)    Glucose, Bld 198 (*)    Creatinine, Ser 1.01 (*)    GFR calc non Af  Amer 49 (*)    GFR calc Af Amer 57 (*)    All other components within normal limits  CBC - Abnormal; Notable for the following components:   WBC 11.2 (*)    Platelets 479 (*)    All other components within normal limits  URINALYSIS, COMPLETE (UACMP) WITH MICROSCOPIC  CBG MONITORING, ED    Pertinent labs  results that were available during my care of the patient were reviewed by me and considered in my medical decision making (see chart for details). ____________________________________________  EKG  I personally interpreted any EKGs ordered by me or triage Sinus tach rate 104 no acute ST elevation or depression nonspecific ST changes no acute ischemia ____________________________________________  RADIOLOGY  Pertinent labs & imaging results that  were available during my care of the patient were reviewed by me and considered in my medical decision making (see chart for details). If possible, patient and/or family made aware of any abnormal findings.  Dg Chest Port 1 View  Result Date: 02/19/2018 CLINICAL DATA:  Progressive weakness over the last 3 months. Increased over 3 weeks. EXAM: PORTABLE CHEST 1 VIEW COMPARISON:  05/21/2016 FINDINGS: Normal heart size and pulmonary vascularity. There is mild diffuse interstitial process to the lungs may be indicating edema or interstitial pneumonitis. No focal consolidation. No blunting of costophrenic angles. No pneumothorax. Calcification of the aorta. IMPRESSION: Mild diffuse interstitial pattern to the lungs suggesting edema or interstitial pneumonitis. Electronically Signed   By: Lucienne Capers M.D.   On: 02/19/2018 22:40   ____________________________________________    PROCEDURES  Procedure(s) performed: None  Procedures  Critical Care performed: None  ____________________________________________   INITIAL IMPRESSION / ASSESSMENT AND PLAN / ED COURSE  Pertinent labs & imaging results that were available during my care of the  patient were reviewed by me and considered in my medical decision making (see chart for details). Patient feeling somewhat weaker than normal, very reassuring exam very pleasant smiles in no acute distress abdomen is benign, check basic blood work and urinalysis.  This seems to be a gradual and chronic decline but we are happy to check and make sure nothing acute is going on.  No evidence of CVA although limited neurologic exam, x-ray is atypical however she is clear without a cough.  We are giving her IV fluids there is no evidence of significant edema peripherally.  Kidney function is preserved, she is not anemic white count slightly up, mostly this is likely going to be something that needs to be followed up with PCP however analysis is pending.  Signed out to Dr. Quentin Cornwall who will reassess the patient prior to further discharge admission decision    ____________________________________________   FINAL CLINICAL IMPRESSION(S) / ED DIAGNOSES  Final diagnoses:  Weakness      This chart was dictated using voice recognition software.  Despite best efforts to proofread,  errors can occur which can change meaning.      Schuyler Amor, MD 02/20/18 [phone]

## 2018-02-20 NOTE — ED Notes (Signed)
Pt accepted to [facility], facility awaiting authorization. Once received, pt will travel by EMS to facility.

## 2018-02-20 NOTE — Progress Notes (Signed)
LCSW met with patient and her daughter and provided a several Museum/gallery curator, memory care ALF facilities and provided additional support. The daughter will be looking at having a Charity fundraiser with patient while she is at [facility]. Patient will transport ED RN has no additional needs.  [employer] LCSW [phone]

## 2018-02-20 NOTE — ED Notes (Addendum)
PT at bedside. Unable to get pt out of bed. Pt was pulled up in bed and covered back in blankets. Pt was able to roll some from side to side.

## 2018-02-20 NOTE — ED Notes (Signed)
Daughter at bedside. Informed daughter that PT and SW are working on SNF placement for pt.

## 2018-02-20 NOTE — Progress Notes (Signed)
LCSW presented daughter with several bed offers and she has accepted [facility] for STR. Daughter was provided [employer]  information and she will call the facility rep.  Spoke to [place] they are awaiting authorization, once this happens patient will go by EMS.  [employer] LCSW [phone]

## 2018-02-20 NOTE — Progress Notes (Signed)
LCSW called and spoke to daughter she is agreeable to have her Mom placed, family requested to send information to [employer]. Once STR completed daughter may elect to bring her home/in home supports. LCSW discussed patient returning home and daughter hiring a night time sitter and he daughter will consider this option as well.  Awaiting bed offers and will advise daughter.  Nessa Ramaker LCSW

## 2018-02-20 NOTE — Evaluation (Signed)
Physical Therapy Evaluation Patient Details Name: Chelsea [name] MRN: [id number] DOB: 08-17-1930 Today's Date: 02/20/2018   History of Present Illness  Patient is an 84 year old female in the ED for progressive weakness and possible UTI.  PMH includes neuropathy, DM and COPD.  Clinical Impression  Patient is an 84 year old female who reportedly lives with her daughter.  She is a poor historian and responses provided regarding social hx were varied.  PT attempted to help pt to bedside to which pt asked for PT to "get more help" and refused when PT stated that pt needed to demonstrate ability to assist with transfers.  She was able to achieve 50% of supine to sit transfer before PT and RN assisted pt back to bed and in scooting up in bed.  She presented with weakness of UE/LE's bilaterally.  Further mobility deemed unsafe at this time.  She reported no pain throughout evaluation but did appear to be mildly agitated.  PT recommends skilled PT in a SNF setting to address strength deficits, safe functional mobility to decrease caregiver burden and for future fall prevention.    Follow Up Recommendations SNF    Equipment Recommendations  None recommended by PT(TBD at next venue of care.)    Recommendations for Other Services       Precautions / Restrictions Precautions Precautions: Fall Restrictions Weight Bearing Restrictions: No      Mobility  Bed Mobility Overal bed mobility: Needs Assistance Bed Mobility: Supine to Sit;Sit to Supine     Supine to sit: Max assist Sit to supine: Max assist   General bed mobility comments: Pt unable to move to EOB without PT assistance at this time; stopped halfway throught transfer and asked if PT could not "go get help".  PT explained nature of evaluation and pt stated that she could not complete transfer at this time.  Pt did attempt to perform a bridge to scoot up in bed but was unable to perform partial bridge.  +2 total assist povided by RN and PT  to scoot pt up in bed.  Transfers Overall transfer level: (Not attempted due to pt weakness and for safety purposes.)                  Ambulation/Gait                Stairs            Wheelchair Mobility    Modified Rankin (Stroke Patients Only)       Balance Overall balance assessment: Needs assistance Sitting-balance support: Bilateral upper extremity supported;Feet supported Sitting balance-Leahy Scale: Poor                                       Pertinent Vitals/Pain Pain Assessment: No/denies pain    Home Living Family/patient expects to be discharged to:: Unsure                      Prior Function Level of Independence: Needs assistance   Gait / Transfers Assistance Needed: Pt is a poor historian and reports that her daughter transfers her to [facility] dependently and then also reports later that she walks with a RW sometimes.           Hand Dominance        Extremity/Trunk Assessment   Upper Extremity Assessment Upper Extremity Assessment: Generalized weakness  Lower Extremity Assessment Lower Extremity Assessment: Generalized weakness    Cervical / Trunk Assessment Cervical / Trunk Assessment: Kyphotic  Communication   Communication: Other (comment)(Hesitation with responses to questions.  Unsure if this if pt being [facility] or if she is having receptive difficulties.)  Cognition Arousal/Alertness: Awake/alert Behavior During Therapy: Restless Overall Cognitive Status: No family/caregiver present to determine baseline cognitive functioning                                 General Comments: Follows commands fairly consistently.  Becomes confused when receiving directions.      General Comments      Exercises     Assessment/Plan    PT Assessment Patient needs continued PT services  PT Problem List Decreased strength;Decreased cognition;Decreased activity tolerance;Decreased mobility;Decreased  balance       PT Treatment Interventions      PT Goals (Current goals can be found in the Care Plan section)  Acute Rehab PT Goals PT Goal Formulation: Patient unable to participate in goal setting    Frequency     Barriers to discharge        Co-evaluation               AM-PAC PT "6 Clicks" Daily Activity  Outcome Measure Difficulty turning over in bed (including adjusting bedclothes, sheets and blankets)?: A Lot Difficulty moving from lying on back to sitting on the side of the bed? : Unable Difficulty sitting down on and standing up from a chair with arms (e.g., wheelchair, bedside commode, etc,.)?: Unable Help needed moving to and from a bed to chair (including a wheelchair)?: Total Help needed walking in hospital room?: Total Help needed climbing 3-5 steps with a railing? : Total 6 Click Score: 7    End of Session Equipment Utilized During Treatment: Oxygen Activity Tolerance: Patient limited by fatigue;Treatment limited secondary to agitation Patient left: in bed Nurse Communication: Mobility status PT Visit Diagnosis: Muscle weakness (generalized) (M62.81)    Time: 0812-0829 PT Time Calculation (min) (ACUTE ONLY): 17 min   Charges:   PT Evaluation $PT Eval Low Complexity: 1 Low          Roxanne Gates, PT, DPT   Roxanne Gates 02/20/2018, 8:50 AM

## 2018-02-20 NOTE — ED Notes (Signed)
Pt given meal tray.

## 2018-02-20 NOTE — ED Provider Notes (Signed)
Blood work reassuring.  No new hypoxia of her baseline.  There is some rare bacteria possible UTI.  Recommend a dose of IV antibiotics and discharged on antibiotic as the patient is chronically wheelchair-bound.  Family at bedside unwilling to take patient back due to worsening weakness and she feels unable to care for her.  Is requesting social work consultation and PT eval for possible placement.   Merlyn Lot, MD 02/20/18 [phone]

## 2018-02-20 NOTE — ED Notes (Signed)
Pt given breakfast tray

## 2018-02-20 NOTE — NC FL2 (Signed)
[place] LEVEL OF CARE SCREENING TOOL     IDENTIFICATION  Patient Name: Chelsea [name] Birthdate: 1930/09/07 Sex: female Admission Date (Current Location): 02/19/2018  [place] and [place] Number:  Engineering geologist and Address:  [facility], [address] [place], [place] [address]      Provider Number: [id number]  Attending Physician Name and Address:  No att. providers found  Relative Name and Phone Number:     Chelsea [name]  [phone]  Current Level of Care: Hospital Recommended Level of Care: [place] Prior Approval Number:    Date Approved/Denied:   PASRR Number:   [id number]  Discharge Plan: SNF    Current Diagnoses: Patient Active Problem List   Diagnosis Date Noted  . COPD exacerbation ([place]) 05/21/2016  . Diabetes ([place]) 05/21/2016  . Physical deconditioning 05/21/2016  . Sepsis ([place]) 02/06/2015    Orientation RESPIRATION BLADDER Height & Weight     Self, Situation  O2(3 liters O2) Incontinent Weight: 187 lb (84.8 kg) Height:  5' 2"  (157.5 cm)  BEHAVIORAL SYMPTOMS/MOOD NEUROLOGICAL BOWEL NUTRITION STATUS      Continent Diet(Diabetic)  AMBULATORY STATUS COMMUNICATION OF NEEDS Skin   Extensive Assist Verbally                         Personal Care Assistance Level of Assistance  Bathing, Feeding, Dressing, Total care Bathing Assistance: Limited assistance Feeding assistance: Independent Dressing Assistance: Limited assistance Total Care Assistance: Limited assistance   Functional Limitations Info  Sight, Hearing, Speech Sight Info: Adequate Hearing Info: Adequate Speech Info: Adequate    SPECIAL CARE FACTORS FREQUENCY   uses wheel chair                    Contractures Contractures Info: Not present    Additional Factors Info  Code Status, Allergies Code Status Info: Full Allergies Info: Lyrica Pregabalin           Current Medications (02/20/2018):  This is the  current hospital active medication list Current Facility-Administered Medications  Medication Dose Route Frequency Provider Last Rate Last Dose  . metoprolol tartrate (LOPRESSOR) tablet 25 mg  25 mg Oral BID Merlyn Lot, MD   25 mg at 02/20/18 0203  . mometasone-formoterol (DULERA) 200-5 MCG/ACT inhaler 2 puff  2 puff Inhalation BID Merlyn Lot, MD      . QUEtiapine (SEROQUEL) tablet 25 mg  25 mg Oral QHS Merlyn Lot, MD   25 mg at 02/20/18 0203   Current Outpatient Medications  Medication Sig Dispense Refill  . ALPRAZolam (XANAX) 0.25 MG tablet Take 0.5 tablets (0.125 mg total) by mouth 2 (two) times daily as needed for anxiety. 30 tablet 0  . calcium citrate-vitamin D (CITRACAL+D) 315-200 MG-UNIT per tablet Take 1 tablet by mouth 2 (two) times daily.    . diphenhydrAMINE (SOMINEX) 25 MG tablet Take 40 mg by mouth at bedtime.     . Fluticasone-Salmeterol (ADVAIR) 250-50 MCG/DOSE AEPB Inhale 2 puffs 2 (two) times daily into the lungs.     . gabapentin (NEURONTIN) 600 MG tablet Take 900 mg by mouth 4 (four) times daily.    Marland Kitchen loratadine (CLARITIN) 10 MG tablet Take 10 mg by mouth every morning.    Marland Kitchen losartan (COZAAR) 100 MG tablet Take 100 mg by mouth daily.    . metoprolol tartrate (LOPRESSOR) 25 MG tablet Take 25 mg by mouth 2 (two) times daily.    . naproxen (  NAPROSYN) 500 MG tablet Take 500 mg by mouth 2 (two) times daily with a meal.    . omeprazole (PRILOSEC OTC) 20 MG tablet Take 20 mg by mouth every morning.    . Psyllium (METAMUCIL PO) Take 500 mg by mouth 2 (two) times daily.    . QUEtiapine (SEROQUEL) 25 MG tablet Take 25 mg by mouth at bedtime.    . sulfaSALAzine (AZULFIDINE) 500 MG tablet Take 1,000 mg by mouth 2 (two) times daily.       Discharge Medications: Please see discharge summary for a list of discharge medications.  Relevant Imaging Results:  Relevant Lab Results:   Additional Information SSN [id number]  Chelsea [name], [place]

## 2018-02-20 NOTE — ED Notes (Signed)
Pt transported to [facility] via [employer].

## 2018-02-20 NOTE — Clinical Social Work Note (Addendum)
Clinical Social Work Assessment  Patient Details  Name: Chelsea [name] MRN: [id number] Date of Birth: 1931-05-29  Date of referral:  02/20/18               Reason for consult:  Facility Placement                Permission sought to share information with:  Family Supports, Customer service manager Permission granted to share information::  Yes, Verbal Permission Granted  Name::     Daughter Wenda Low [phone]  [facility]  Agency::  All facilities  Relationship::     Contact Information:     Housing/Transportation Living arrangements for the past 2 months:  Single Family Home Source of Information:  Patient Patient Interpreter Needed:  None Criminal Activity/Legal Involvement Pertinent to Current Situation/Hospitalization:  No - Comment as needed Significant Relationships:  Adult Children Lives with:  Adult Children Do you feel safe going back to the place where you live?  Yes Need for family participation in patient care:  Yes (Comment)  Care giving concerns: Daughter is unable to care for her mom at this time   Facilities manager / plan: LCSW introduced myself to patient who gave verbal consent to speak to family and send information out to SNF. Patient uses wheel chair,  And she needs full assistance with all her ADls. Patiernt was living with daughter and grandchildren and they are unable to care for her. LCSW will complete assessment and Fl2 and send out information to SNF for STR. Patient is diabetic-but does not take insulin. Patient is incontinent. Patient is widowed and only one daughter. Patient is oriented x2Daughter states she is POA, pt advanced dementia. Daughter states she has been progressively getting weaker over the last three months with increase in last 3 weeks. Pt unable to stand but been in wheel chair for 1 year with transferes only. O2 sat 3 litres  Employment status:  Retired Forensic scientist:  Production designer, theatre/television/film) PT Recommendations:    SNF Information / Referral to community resources:  [place]  Patient/Family's Response to care: TBD  Patient/Family's Understanding of and Emotional Response to Diagnosis, Current Treatment, and Prognosis: Patient understands she needs assistance and is weak  Emotional Assessment Appearance:  Appears stated age Attitude/Demeanor/Rapport:  Gracious Affect (typically observed):  Accepting, Adaptable Orientation:  Oriented to Self, Oriented to Place, Oriented to  Time, Oriented to Situation Alcohol / Substance use:  Not Applicable Psych involvement (Current and /or in the community):  No (Comment)  Discharge Needs  Concerns to be addressed:  Discharge Planning Concerns Readmission within the last 30 days:  No Current discharge risk:  None Barriers to Discharge:  No Barriers Identified   Joana Reamer, LCSW 02/20/2018, 9:09 AM

## 2018-02-20 NOTE — Progress Notes (Signed)
LCSW was asked by [facility] to fax over the After visit summary to her [phone]) Spoke to EDP and she will print it off and LCSW will fax it.  Patient will remain in ED until authorization is obtained for [facility].  [employer] LCSW [phone]

## 2018-02-21 ENCOUNTER — Non-Acute Institutional Stay (SKILLED_NURSING_FACILITY): Payer: Medicare Other | Admitting: Adult Health

## 2018-02-21 ENCOUNTER — Encounter: Payer: Self-pay | Admitting: Adult Health

## 2018-02-21 DIAGNOSIS — I10 Essential (primary) hypertension: Secondary | ICD-10-CM

## 2018-02-21 DIAGNOSIS — G6289 Other specified polyneuropathies: Secondary | ICD-10-CM

## 2018-02-21 DIAGNOSIS — F418 Other specified anxiety disorders: Secondary | ICD-10-CM

## 2018-02-21 DIAGNOSIS — J449 Chronic obstructive pulmonary disease, unspecified: Secondary | ICD-10-CM | POA: Diagnosis not present

## 2018-02-21 DIAGNOSIS — M1990 Unspecified osteoarthritis, unspecified site: Secondary | ICD-10-CM

## 2018-02-21 DIAGNOSIS — E119 Type 2 diabetes mellitus without complications: Secondary | ICD-10-CM

## 2018-02-21 DIAGNOSIS — F0150 Vascular dementia without behavioral disturbance: Secondary | ICD-10-CM

## 2018-02-21 DIAGNOSIS — K5909 Other constipation: Secondary | ICD-10-CM

## 2018-02-21 LAB — URINE CULTURE: CULTURE: NO GROWTH

## 2018-02-21 NOTE — Progress Notes (Signed)
Location:   [place] Room Number: 201A Place of Service:  SNF (31)    CODE STATUS: FULL  Allergies  Allergen Reactions  . Lyrica [Pregabalin] Swelling    Chief Complaint  Patient presents with  . Acute Visit    ED Follow-up    HPI:  She had been taken to the ED from her home due to increased weakness. She is unable to fully participate in the phi or ros. She is on home 02. She is wheelchair bound. Her family would like for to be streonger before returning home. She denies any back pain. She does have anxiety. She will continue to be followed for her chronic illnesses including: copd; dementia; anxiety.    Past Medical History:  Diagnosis Date  . Anemia   . Chronic obstructive asthma ([place])   . COPD (chronic obstructive pulmonary disease) ([place])   . Depression   . Diabetes mellitus without complication ([place])   . Femoral bruit   . GERD without esophagitis   . Hypertension   . Hypertension   . Lumbago   . Neuropathy   . Osteoarthritis   . Psoriasis   . Tremor   . Ulcerative colitis ([place])   . Vertigo     Past Surgical History:  Procedure Laterality Date  . ABDOMINAL HYSTERECTOMY    . APPENDECTOMY    . EYE SURGERY      Social History   Socioeconomic History  . Marital status: Widowed    Spouse name: Not on file  . Number of children: Not on file  . Years of education: Not on file  . Highest education level: Not on file  Occupational History  . Not on file  Social Needs  . Financial resource strain: Not on file  . Food insecurity:    Worry: Not on file    Inability: Not on file  . Transportation needs:    Medical: Not on file    Non-medical: Not on file  Tobacco Use  . Smoking status: Former Research scientist (life sciences)  . Smokeless tobacco: Never Used  Substance and Sexual Activity  . Alcohol use: No  . Drug use: No  . Sexual activity: Never  Lifestyle  . Physical activity:    Days per week: Not on file    Minutes per session: Not on  file  . Stress: Not on file  Relationships  . Social connections:    Talks on phone: Not on file    Gets together: Not on file    Attends religious service: Not on file    Active member of club or organization: Not on file    Attends meetings of clubs or organizations: Not on file    Relationship status: Not on file  . Intimate partner violence:    Fear of current or ex partner: Not on file    Emotionally abused: Not on file    Physically abused: Not on file    Forced sexual activity: Not on file  Other Topics Concern  . Not on file  Social History Narrative  . Not on file   Family History  Problem Relation Age of Onset  . Diabetes Mother   . Liver cancer Father       VITAL SIGNS BP (!) 166/82   Pulse 98   Temp 98 F (36.7 C) (Oral)   Resp 18   Ht 5' 2"  (1.575 m)   Wt 165 lb 8 oz (75.1 kg)   SpO2 94%  BMI 30.27 kg/m   Outpatient Encounter Medications as of 02/21/2018  Medication Sig  . ALPRAZolam (XANAX) 0.25 MG tablet Take 0.125 mg by mouth 2 (two) times daily as needed for anxiety.  . calcium citrate-vitamin D (CITRACAL+D) 315-200 MG-UNIT tablet Take 1 tablet by mouth 2 (two) times daily.  . diphenhydrAMINE (BENADRYL) 25 MG tablet Take 25 mg by mouth at bedtime.  . Fluticasone-Salmeterol (ADVAIR) 250-50 MCG/DOSE AEPB Inhale 2 puffs into the lungs 2 (two) times daily.   Marland Kitchen gabapentin (NEURONTIN) 600 MG tablet Take 900 mg by mouth 4 (four) times daily.   Marland Kitchen loratadine (CLARITIN) 10 MG tablet Take 10 mg by mouth daily.   Marland Kitchen losartan (COZAAR) 100 MG tablet Take 100 mg by mouth daily.   . metoprolol tartrate (LOPRESSOR) 25 MG tablet Take 25 mg by mouth 2 (two) times daily.  . naproxen (NAPROSYN) 500 MG tablet Take 500 mg by mouth 2 (two) times daily with a meal.  . NON FORMULARY Diet Type: NCS  . psyllium (REGULOID) 0.52 g capsule Take 0.52 g by mouth 2 (two) times daily.   No facility-administered encounter medications on file as of 02/21/2018.      SIGNIFICANT  DIAGNOSTIC EXAMS  TODAY:  02-19-18: chest x-ray: Mild diffuse interstitial pattern to the lungs suggesting edema or interstitial pneumonitis.  LABS REVIEWED TODAY:   02-19-18: wbc 11.2; hgb 13.0; hct 37.4; mcv 91.8; plt 479; glucose 198 bun 19; creat 1.01; k+ 4.5; na++ 133; ca 9.2  BNP  105.0 02-20-18: urine culture: no growth     Review of Systems  Unable to perform ROS: Dementia (poor historian )    Physical Exam  Constitutional: She appears well-developed and well-nourished. No distress.  Neck: No thyromegaly present.  Cardiovascular: Normal rate, regular rhythm, normal heart sounds and intact distal pulses.  Pulmonary/Chest: Effort normal. No respiratory distress.  02 dependent Breath sounds diminished in bases   Abdominal: Soft. Bowel sounds are normal. She exhibits no distension. There is no tenderness.  Musculoskeletal: She exhibits no edema.  Is able to move all extremities   Lymphadenopathy:    She has no cervical adenopathy.  Neurological: She is alert.  Skin: Skin is warm and dry. She is not diaphoretic.  Psychiatric: She has a normal mood and affect.    ASSESSMENT/ PLAN:  TODAY:   1.  Essential hypertension, benign: is stable b/p 166/82; will continue cozaar 100 mg daily; lopressor 25 mg twice daily  2. Chronic obstructive pulmonary disease: is stable is 02 dependent; will continue advair 250/50 2 puffs twice daily claritin 10 mg daily   3. Peripheral neuropathy: is stable will change neurontin to 900 mg twice daily   4. Diabetes mellitus type 2 noninsulin dependent: is stable is currently not on medications is on arb.   5. Chronic arthritis: is stable will continue naprosyn 500 mg twice daily   6. Depression and anxiety: is without change: will restart cymbalta 30 mg daily will stop xanax   7. Vascular dementia without behavioral disturbance: is without change will restart aricept 5 mg nightly  8. Chronic constipation: is stable will continue psyllium  0.52 gm twice daily   Will stop benadryl; metformin seroquel       MD is aware of resident's narcotic use and is in agreement with current plan of care. We will attempt to wean resident as apropriate   Ok Edwards NP [facility] Adult Medicine  Contact [phone] Monday through Friday 8am- 5pm  After hours call [phone]

## 2018-03-02 DIAGNOSIS — F418 Other specified anxiety disorders: Secondary | ICD-10-CM | POA: Insufficient documentation

## 2018-03-02 DIAGNOSIS — M1990 Unspecified osteoarthritis, unspecified site: Secondary | ICD-10-CM | POA: Insufficient documentation

## 2018-03-02 DIAGNOSIS — I10 Essential (primary) hypertension: Secondary | ICD-10-CM | POA: Insufficient documentation

## 2018-03-02 DIAGNOSIS — G629 Polyneuropathy, unspecified: Secondary | ICD-10-CM | POA: Insufficient documentation

## 2018-03-02 DIAGNOSIS — F0150 Vascular dementia without behavioral disturbance: Secondary | ICD-10-CM | POA: Insufficient documentation

## 2018-03-02 DIAGNOSIS — K5909 Other constipation: Secondary | ICD-10-CM | POA: Insufficient documentation

## 2018-03-05 ENCOUNTER — Encounter
Admission: RE | Admit: 2018-03-05 | Discharge: 2018-03-05 | Disposition: A | Discharge status: Home / Self Care | Payer: Medicare Other | Source: Ambulatory Visit | Attending: Internal Medicine | Admitting: Internal Medicine

## 2018-03-07 ENCOUNTER — Non-Acute Institutional Stay (SKILLED_NURSING_FACILITY): Payer: Medicare Other | Admitting: Adult Health

## 2018-03-07 ENCOUNTER — Encounter: Payer: Self-pay | Admitting: Adult Health

## 2018-03-07 DIAGNOSIS — F0150 Vascular dementia without behavioral disturbance: Secondary | ICD-10-CM | POA: Diagnosis not present

## 2018-03-07 DIAGNOSIS — R5381 Other malaise: Secondary | ICD-10-CM | POA: Diagnosis not present

## 2018-03-07 DIAGNOSIS — J449 Chronic obstructive pulmonary disease, unspecified: Secondary | ICD-10-CM

## 2018-03-07 NOTE — Progress Notes (Signed)
[facility]:   [place] [facility] Room Number: [place] of Service:  SNF (31)    CODE STATUS: DNR  Allergies  Allergen Reactions  . Lyrica [Pregabalin] Swelling    Chief Complaint  Patient presents with  . Discharge Note    Discharging to home    HPI:  She is being discharged to home with home health for pt/ot. She will need a light weight wheelchair; her medications written. She will need to follow up with her medical provider. She was in the ED fur to increased weakness. She was admitted to this facility for short term rehab and is now ready for discharge to home.   Past Medical History:  Diagnosis Date  . Anemia   . Chronic obstructive asthma ([place])   . COPD (chronic obstructive pulmonary disease) ([place])   . Depression   . Diabetes mellitus without complication ([place])   . Femoral bruit   . GERD without esophagitis   . Hypertension   . Hypertension   . Lumbago   . Neuropathy   . Osteoarthritis   . Psoriasis   . Tremor   . Ulcerative colitis ([place])   . Vertigo     Past Surgical History:  Procedure Laterality Date  . ABDOMINAL HYSTERECTOMY    . APPENDECTOMY    . EYE SURGERY      Social History   Socioeconomic History  . Marital status: Widowed    Spouse name: Not on file  . Number of children: Not on file  . Years of education: Not on file  . Highest education level: Not on file  Occupational History  . Not on file  Social Needs  . Financial resource strain: Not on file  . Food insecurity:    Worry: Not on file    Inability: Not on file  . Transportation needs:    Medical: Not on file    Non-medical: Not on file  Tobacco Use  . Smoking status: Former Research scientist (life sciences)  . Smokeless tobacco: Never Used  Substance and Sexual Activity  . Alcohol use: No  . Drug use: No  . Sexual activity: Never  Lifestyle  . Physical activity:    Days per week: Not on file    Minutes per session: Not on file  . Stress: Not on file  Relationships  .  Social connections:    Talks on phone: Not on file    Gets together: Not on file    Attends religious service: Not on file    Active member of club or organization: Not on file    Attends meetings of clubs or organizations: Not on file    Relationship status: Not on file  . Intimate partner violence:    Fear of current or ex partner: Not on file    Emotionally abused: Not on file    Physically abused: Not on file    Forced sexual activity: Not on file  Other Topics Concern  . Not on file  Social History Narrative  . Not on file   Family History  Problem Relation Age of Onset  . Diabetes Mother   . Liver cancer Father     VITAL SIGNS BP (!) 159/70   Pulse (!) 59   Temp (!) 97.5 F (36.4 C)   Resp 16   Ht 5' 2"  (1.575 m)   Wt 162 lb 9.6 oz (73.8 kg)   SpO2 94%   BMI 29.74 kg/m   Patient's Medications  New Prescriptions   No medications on file  Previous Medications   ACETAMINOPHEN (TYLENOL) 325 MG TABLET    Take 650 mg by mouth every 4 (four) hours as needed.   CALCIUM CITRATE-VITAMIN D (CITRACAL+D) 315-200 MG-UNIT TABLET    Take 1 tablet by mouth 2 (two) times daily.   DONEPEZIL (ARICEPT) 5 MG TABLET    Take 5 mg by mouth at bedtime.   DULOXETINE (CYMBALTA) 30 MG CAPSULE    Take 30 mg by mouth daily.   FLUTICASONE-SALMETEROL (ADVAIR) 250-50 MCG/DOSE AEPB    Inhale 2 puffs into the lungs 2 (two) times daily.    GABAPENTIN (NEURONTIN) 300 MG CAPSULE    Take 900 mg by mouth 2 (two) times daily.    INFANT CARE PRODUCTS (DERMACLOUD EX)    Apply liberal amount topically to area area of skin irritation as needed   LORATADINE (CLARITIN) 10 MG TABLET    Take 10 mg by mouth daily.    LOSARTAN (COZAAR) 100 MG TABLET    Take 100 mg by mouth daily.    METOPROLOL TARTRATE (LOPRESSOR) 25 MG TABLET    Take 25 mg by mouth 2 (two) times daily.   NAPROXEN (NAPROSYN) 500 MG TABLET    Take 500 mg by mouth 2 (two) times daily with a meal.   NON FORMULARY    Diet Type: NCS   OMEPRAZOLE  (PRILOSEC) 20 MG CAPSULE    Take 20 mg by mouth daily.   PSYLLIUM (REGULOID) 0.52 G CAPSULE    Take 0.52 g by mouth 2 (two) times daily.   SULFASALAZINE (AZULFIDINE) 500 MG TABLET    Take 1,000 mg by mouth 2 (two) times daily.  Modified Medications   No medications on file  Discontinued Medications   DIPHENHYDRAMINE (BENADRYL) 25 MG TABLET    Take 25 mg by mouth at bedtime.     SIGNIFICANT DIAGNOSTIC EXAMS  PREVIOUS:  02-19-18: chest x-ray: Mild diffuse interstitial pattern to the lungs suggesting edema or interstitial pneumonitis.  NO NEW EXAMS.   LABS REVIEWED TODAY:   02-19-18: wbc 11.2; hgb 13.0; hct 37.4; mcv 91.8; plt 479; glucose 198 bun 19; creat 1.01; k+ 4.5; na++ 133; ca 9.2  BNP  105.0 02-20-18: urine culture: no growth   NO NEW LABS.    Review of Systems  Unable to perform ROS: Dementia (poor memory )    Physical Exam  Constitutional: She appears well-developed and well-nourished. No distress.  Neck: No thyromegaly present.  Cardiovascular: Normal rate, regular rhythm, normal heart sounds and intact distal pulses.  Pulmonary/Chest: Effort normal. No respiratory distress.  02 dependent Breath sounds diminished in bases bilaterally   Abdominal: Soft. Bowel sounds are normal. She exhibits no distension. There is no tenderness.  Musculoskeletal: She exhibits no edema.  Is able to move all extremities   Lymphadenopathy:    She has no cervical adenopathy.  Neurological: She is alert.  Skin: Skin is warm and dry. She is not diaphoretic.  Psychiatric: She has a normal mood and affect.      ASSESSMENT/ PLAN:  Patient is being discharged with the following home health services:  Pt/ot to evaluate and treat as indicated for gait balance strength adl training   Patient is being discharged with the following durable medical equipment:  Light weight wheelchair to allow her to maintain her current level of independence with her alds which cannot be achieved with a  walker. She is able to propel herself; she requires a light weight  wheelchair due to her poor endurance.   Patient has been advised to f/u with their PCP in 1-2 weeks to bring them up to date on their rehab stay.  Social services at facility was responsible for arranging this appointment.  Pt was provided with a 30 day supply of prescriptions for medications and refills must be obtained from their PCP.  For controlled substances, a more limited supply may be provided adequate until PCP appointment only.   A 30 day supply of medications written as listed above.    Time spent with patient and staff: 35 minutes: discussed medications; home health needs and dme  Ok Edwards NP [facility]  Contact [phone] Monday through Friday 8am- 5pm  After hours call [phone]

## 2018-03-10 ENCOUNTER — Emergency Department: Payer: Medicare Other

## 2018-03-10 ENCOUNTER — Encounter: Payer: Self-pay | Admitting: Emergency Medicine

## 2018-03-10 ENCOUNTER — Other Ambulatory Visit: Payer: Self-pay

## 2018-03-10 ENCOUNTER — Emergency Department
Admission: EM | Admit: 2018-03-10 | Discharge: 2018-03-11 | Disposition: A | Discharge status: Skilled Nursing Facility | Payer: Medicare Other | Attending: Emergency Medicine | Admitting: Emergency Medicine

## 2018-03-10 DIAGNOSIS — I10 Essential (primary) hypertension: Secondary | ICD-10-CM | POA: Diagnosis not present

## 2018-03-10 DIAGNOSIS — S0990XA Unspecified injury of head, initial encounter: Secondary | ICD-10-CM | POA: Insufficient documentation

## 2018-03-10 DIAGNOSIS — Y999 Unspecified external cause status: Secondary | ICD-10-CM | POA: Insufficient documentation

## 2018-03-10 DIAGNOSIS — Y92122 Bedroom in nursing home as the place of occurrence of the external cause: Secondary | ICD-10-CM | POA: Diagnosis not present

## 2018-03-10 DIAGNOSIS — W19XXXA Unspecified fall, initial encounter: Secondary | ICD-10-CM | POA: Insufficient documentation

## 2018-03-10 DIAGNOSIS — Z87891 Personal history of nicotine dependence: Secondary | ICD-10-CM | POA: Insufficient documentation

## 2018-03-10 DIAGNOSIS — F0150 Vascular dementia without behavioral disturbance: Secondary | ICD-10-CM | POA: Diagnosis not present

## 2018-03-10 DIAGNOSIS — E119 Type 2 diabetes mellitus without complications: Secondary | ICD-10-CM | POA: Insufficient documentation

## 2018-03-10 DIAGNOSIS — Z79899 Other long term (current) drug therapy: Secondary | ICD-10-CM | POA: Diagnosis not present

## 2018-03-10 DIAGNOSIS — J449 Chronic obstructive pulmonary disease, unspecified: Secondary | ICD-10-CM | POA: Diagnosis not present

## 2018-03-10 DIAGNOSIS — Y9389 Activity, other specified: Secondary | ICD-10-CM | POA: Diagnosis not present

## 2018-03-10 LAB — TROPONIN I: Troponin I: <0.03 ng/mL (ref <0.03)

## 2018-03-10 LAB — BASIC METABOLIC PANEL
Anion gap: 10 (ref 5–15)
BUN: 14 mg/dL (ref 8–23)
CHLORIDE: 96 mmol/L — AB (ref 98–111)
CO2: 28 mmol/L (ref 22–32)
Calcium: 10.1 mg/dL (ref 8.9–10.3)
Creatinine, Ser: 0.91 mg/dL (ref 0.44–1.00)
GFR calc non Af Amer: 56 mL/min — ABNORMAL LOW (ref >60)
Glucose, Bld: 154 mg/dL — ABNORMAL HIGH (ref 70–99)
Potassium: 4 mmol/L (ref 3.5–5.1)
Sodium: 134 mmol/L — ABNORMAL LOW (ref 135–145)

## 2018-03-10 LAB — CBC WITH DIFFERENTIAL/PLATELET
BASOS PCT: 1 %
Basophils Absolute: 0.1 10*3/uL (ref 0–0.1)
EOS ABS: 0.4 10*3/uL (ref 0–0.7)
Eosinophils Relative: 5 %
HCT: 39.3 % (ref 35.0–47.0)
HEMOGLOBIN: 12.9 g/dL (ref 12.0–16.0)
Lymphocytes Relative: 20 %
Lymphs Abs: 1.5 10*3/uL (ref 1.0–3.6)
MCH: 30.3 pg (ref 26.0–34.0)
MCHC: 32.7 g/dL (ref 32.0–36.0)
MCV: 92.5 fL (ref 80.0–100.0)
MONOS PCT: 13 %
Monocytes Absolute: 1 10*3/uL — ABNORMAL HIGH (ref 0.2–0.9)
NEUTROS PCT: 61 %
Neutro Abs: 4.8 10*3/uL (ref 1.4–6.5)
Platelets: 471 10*3/uL — ABNORMAL HIGH (ref 150–440)
RBC: 4.25 MIL/uL (ref 3.80–5.20)
RDW: 13.4 % (ref 11.5–14.5)
WBC: 7.7 10*3/uL (ref 3.6–11.0)

## 2018-03-10 NOTE — ED Triage Notes (Signed)
Pt arrives by EMS with c/o unwitnessed fall. Pt is alert to self only which her baseline and comes from [facility]. Pt reports sliding off of her chair and hitting her head. Pt is on Naproxen. Pt is in NAD.

## 2018-03-10 NOTE — ED Provider Notes (Signed)
[facility] Emergency Department Provider Note   ____________________________________________   First MD Initiated Contact with Patient 03/10/18 2308     (approximate)  I have reviewed the triage vital signs and the nursing notes.   HISTORY  Chief Complaint Fall  Level V caveat: History limited by dementia; family at bedside  HPI Chelsea [name] is a 84 y.o. female brought to the ED from [facility] status post unwitnessed fall.  Patient has a history of dementia and was found the floor next to her bed.  Patient reported sliding off her chair and striking her head.  Unknown LOC.  Takes naproxen daily.  Voices no other complaints.  Specifically, denies headache, vision changes, neck pain, chest pain, shortness of breath, abdominal pain, nausea or vomiting.  Family states patient does not ambulate at baseline.   Past Medical History:  Diagnosis Date  . Anemia   . Chronic obstructive asthma ([place])   . COPD (chronic obstructive pulmonary disease) ([place])   . Depression   . Diabetes mellitus without complication ([place])   . Femoral bruit   . GERD without esophagitis   . Hypertension   . Hypertension   . Lumbago   . Neuropathy   . Osteoarthritis   . Psoriasis   . Tremor   . Ulcerative colitis ([place])   . Vertigo     Patient Active Problem List   Diagnosis Date Noted  . Essential hypertension, benign 03/02/2018  . Peripheral neuropathy 03/02/2018  . Chronic arthritis 03/02/2018  . Vascular dementia without behavioral disturbance ([place]) 03/02/2018  . Depression with anxiety 03/02/2018  . Chronic constipation 03/02/2018  . Chronic obstructive pulmonary disease (COPD) ([place]) 05/21/2016  . Diabetes mellitus type 2, noninsulin dependent ([place]) 05/21/2016  . Physical deconditioning 05/21/2016    Past Surgical History:  Procedure Laterality Date  . ABDOMINAL HYSTERECTOMY    . APPENDECTOMY    . EYE SURGERY      Prior to Admission medications     Medication Sig Start Date End Date Taking? Authorizing Provider  acetaminophen (TYLENOL) 325 MG tablet Take 650 mg by mouth every 4 (four) hours as needed. 02/25/18   [provider]  calcium citrate-vitamin D (CITRACAL+D) 315-200 MG-UNIT tablet Take 1 tablet by mouth 2 (two) times daily.    [provider]  donepezil (ARICEPT) 5 MG tablet Take 5 mg by mouth at bedtime. 02/22/18   [provider]  DULoxetine (CYMBALTA) 30 MG capsule Take 30 mg by mouth daily. 02/22/18   [provider]  Fluticasone-Salmeterol (ADVAIR) 250-50 MCG/DOSE AEPB Inhale 2 puffs into the lungs 2 (two) times daily.     [provider]  gabapentin (NEURONTIN) 300 MG capsule Take 900 mg by mouth 2 (two) times daily.  02/26/18   [provider]  [facility] EX) Apply liberal amount topically to area area of skin irritation as needed 02/22/18   [provider]  loratadine (CLARITIN) 10 MG tablet Take 10 mg by mouth daily.     [provider]  losartan (COZAAR) 100 MG tablet Take 100 mg by mouth daily.     [provider]  metoprolol tartrate (LOPRESSOR) 25 MG tablet Take 25 mg by mouth 2 (two) times daily.    [provider]  naproxen (NAPROSYN) 500 MG tablet Take 500 mg by mouth 2 (two) times daily with a meal.    [provider]  NON FORMULARY Diet Type: NCS    [provider]  omeprazole (PRILOSEC) 20 MG capsule Take 20 mg by mouth daily. 02/20/18   [provider]  psyllium (REGULOID) 0.52 g capsule Take 0.52 g by mouth 2 (two) times daily.    [provider]  sulfaSALAzine (AZULFIDINE) 500 MG tablet Take 1,000 mg by mouth 2 (two) times daily. 03/04/18   [provider]    Allergies Lyrica [pregabalin]  Family History  Problem Relation Age of Onset  . Diabetes Mother   . Liver cancer Father     Social History Social History   Tobacco Use  . Smoking status: Former  Research scientist (life sciences)  . Smokeless tobacco: Never Used  Substance Use Topics  . Alcohol use: No  . Drug use: No    Review of Systems  Constitutional: Positive for fall.  No fever/chills Eyes: No visual changes. ENT: No sore throat. Cardiovascular: Denies chest pain. Respiratory: Denies shortness of breath. Gastrointestinal: No abdominal pain.  No nausea, no vomiting.  No diarrhea.  No constipation. Genitourinary: Negative for dysuria. Musculoskeletal: Negative for back pain. Skin: Negative for rash. Neurological: Negative for headaches, focal weakness or numbness.   ____________________________________________   PHYSICAL EXAM:  VITAL SIGNS: ED Triage Vitals [03/10/18 2255]  Enc Vitals Group     BP      Pulse      Resp      Temp      Temp src      SpO2      Weight 148 lb (67.1 kg)     Height 5' 2"  (1.575 m)     Head Circumference      Peak Flow      Pain Score 5     Pain Loc      Pain Edu?      Excl. in [place]?     Constitutional: Alert and oriented. Well appearing and in no acute distress.  Smiling and pleasant. Eyes: Conjunctivae are normal. PERRL. EOMI. Head: Atraumatic. Nose: Atraumatic. Mouth/Throat: Mucous membranes are moist.  No dental malocclusion. Neck: No stridor.  No cervical spine tenderness to palpation. Cardiovascular: Normal rate, regular rhythm. Grossly normal heart sounds.  Good peripheral circulation. Respiratory: Normal respiratory effort.  No retractions. Lungs CTAB. Gastrointestinal: Soft and nontender. No distention. No abdominal bruits. No CVA tenderness. Musculoskeletal: No spinal tenderness to palpation.  Pelvis stable.  No lower extremity tenderness nor edema.  No joint effusions. Neurologic:  Normal speech and language. No gross focal neurologic deficits are appreciated.  Skin:  Skin is warm, dry and intact. No rash noted. Psychiatric: Mood and affect are normal. Speech and behavior are normal.  ____________________________________________    LABS (all labs ordered are listed, but only abnormal results are displayed)  Labs Reviewed  CBC WITH DIFFERENTIAL/PLATELET - Abnormal; Notable for the following components:      Result Value   Platelets 471 (*)    Monocytes Absolute 1.0 (*)    All other components within normal limits  BASIC METABOLIC PANEL - Abnormal; Notable for the following components:   Sodium 134 (*)    Chloride 96 (*)    Glucose, Bld 154 (*)    GFR calc non Af Amer 56 (*)    All other components within normal limits  URINALYSIS, COMPLETE (UACMP) WITH MICROSCOPIC - Abnormal; Notable for the following components:   Color, Urine YELLOW (*)    APPearance CLEAR (*)    Protein, ur 100 (*)    All other components within normal limits  TROPONIN I   ____________________________________________  EKG  ED ECG REPORT I, , J, the attending physician, personally viewed and interpreted this ECG.   Date: 03/10/2018  EKG Time: 2249  Rate: 70  Rhythm: normal EKG, normal sinus rhythm  Axis: Normal  Intervals:none  ST&T Change: Nonspecific  ____________________________________________  RADIOLOGY  ED MD interpretation: No ICH  Official radiology report(s): Ct Head Wo Contrast  Result Date: 03/10/2018 CLINICAL DATA:  Unwitnessed fall. EXAM: CT HEAD WITHOUT CONTRAST TECHNIQUE: Contiguous axial images were obtained from the base of the skull through the vertex without intravenous contrast. COMPARISON:  Head CT 02/06/2015 FINDINGS: Brain: Moderate cortical atrophy, with progression in central atrophy from prior exam, progressive ventriculomegaly. Moderate chronic small vessel ischemia, unchanged. No intracranial hemorrhage, mass effect, or midline shift. The basilar cisterns are patent. No evidence of territorial infarct or acute ischemia. No extra-axial or intracranial fluid collection. Vascular: Atherosclerosis of skullbase vasculature without hyperdense vessel or abnormal calcification. Skull: No fracture or  focal lesion. Sinuses/Orbits: Minimal retained secretions in the sphenoid sinus. Mastoid air cells are clear. Prior bilateral cataract resection. Other: None. IMPRESSION: 1.  No acute intracranial abnormality.  No skull fracture. 2. Progression in atrophy 2016. Moderate chronic small vessel ischemia peers similar. Electronically Signed   By: Keith Rake M.D.   On: 03/10/2018 23:47    ____________________________________________   PROCEDURES  Procedure(s) performed: None  Procedures  Critical Care performed: No  ____________________________________________   INITIAL IMPRESSION / ASSESSMENT AND PLAN / ED COURSE  As part of my medical decision making, I reviewed the following data within the [place] History obtained from family, Nursing notes reviewed and incorporated, Labs reviewed, EKG interpreted, Old chart reviewed, Radiograph reviewed and Notes from prior ED visits   85 year old female with dementia status post unwitnessed fall from nursing facility.  Differential diagnosis includes but is not limited to [place], CVA, CAD, UTI, etc.  Will obtain CT head to evaluate for intercranial hemorrhage, obtain basic lab work including troponin, EKG and urinalysis.  Will reassess.  Clinical Course as of Mar 11 108  Molli Knock Mar 11, 2018  0108 Updated patient of all test results.  Will discharge back to facility.  Strict return precautions given.  Patient verbalizes understanding and agrees with plan of care.   [JS]    Clinical Course User Index [JS] Paulette Blanch, MD     ____________________________________________   FINAL CLINICAL IMPRESSION(S) / ED DIAGNOSES  Final diagnoses:  Fall, initial encounter     ED Discharge Orders    None       Note:  This document was prepared using Dragon voice recognition software and may include unintentional dictation errors.    Paulette Blanch, MD 03/11/18 [phone]

## 2018-03-10 NOTE — ED Notes (Signed)
Patient transported to CT 

## 2018-03-11 LAB — URINALYSIS, COMPLETE (UACMP) WITH MICROSCOPIC
BILIRUBIN URINE: NEGATIVE
Bacteria, UA: NONE SEEN
GLUCOSE, UA: NEGATIVE mg/dL
HGB URINE DIPSTICK: NEGATIVE
Ketones, ur: NEGATIVE mg/dL
LEUKOCYTES UA: NEGATIVE
NITRITE: NEGATIVE
Protein, ur: 100 mg/dL — AB
SPECIFIC GRAVITY, URINE: 1.019 (ref 1.005–1.030)
Squamous Epithelial / LPF: NONE SEEN (ref 0–5)
pH: 6 (ref 5.0–8.0)

## 2018-03-11 NOTE — ED Notes (Signed)
Pt unable to sign DC on DC pad due to dementia.

## 2018-03-11 NOTE — Discharge Instructions (Addendum)
Continue all medications as directed by your doctor.  Return to the ER for worsening symptoms, persistent vomiting, difficulty breathing or other concerns.

## 2018-04-24 ENCOUNTER — Encounter: Payer: Self-pay | Admitting: Adult Health

## 2018-04-24 NOTE — Progress Notes (Signed)
Entered in error

## 2018-05-05 ENCOUNTER — Encounter
Admission: RE | Admit: 2018-05-05 | Discharge: 2018-05-05 | Disposition: A | Discharge status: Home / Self Care | Payer: Medicare Other | Source: Ambulatory Visit | Attending: Internal Medicine | Admitting: Internal Medicine

## 2018-05-08 ENCOUNTER — Encounter: Payer: Self-pay | Admitting: Adult Health

## 2018-05-08 ENCOUNTER — Non-Acute Institutional Stay (SKILLED_NURSING_FACILITY): Payer: Medicare Other | Admitting: Adult Health

## 2018-05-08 DIAGNOSIS — G6289 Other specified polyneuropathies: Secondary | ICD-10-CM

## 2018-05-08 DIAGNOSIS — E119 Type 2 diabetes mellitus without complications: Secondary | ICD-10-CM

## 2018-05-08 DIAGNOSIS — I10 Essential (primary) hypertension: Secondary | ICD-10-CM

## 2018-05-08 DIAGNOSIS — J449 Chronic obstructive pulmonary disease, unspecified: Secondary | ICD-10-CM

## 2018-05-08 NOTE — Progress Notes (Signed)
[facility] Room Number: [place] of Service:  SNF (31)   CODE STATUS: DNR  Allergies  Allergen Reactions  . Lyrica [Pregabalin] Swelling     Chief Complaint  Patient presents with  . Medical Management of Chronic Issues: essential hypertension benign; chronic obstructive pulmonary disease unspecified copd type; other polyneuropathy; diabetes type 2 non insulin dependent      HPI:  She is a 84 year old long term resident of this facility being seen for the management of her chronic illnesses; hypertension; copd; polyneuropathy; diabetes. We have come together for her routine care plan meeting; her daughter is present. She has had 2 falls in NOv without injury. She has experienced 3 moves since her admission. She is easily agitated she does states that she is having insomnia and has depression. Her spouse died in 2006/08/26; she has been dealing with these emotional issues since that time. There are no reports of uncontrolled; no reports of changes in appetite. We have reviewed her emotional status; will increase her aricept to 10 mg daily. If she emotionally remains without change will consider increasing her cymbalta at that time. We have discussed her advanced directives with her daughter who is an only child. We discussed CPR; hospitalizations; and tube feeding. She will take the MOST form home with her to fill out.   Past Medical History:  Diagnosis Date  . Anemia   . Chronic obstructive asthma ([place])   . COPD (chronic obstructive pulmonary disease) ([place])   . Depression   . Diabetes mellitus without complication ([place])   . Femoral bruit   . GERD without esophagitis   . Hypertension   . Hypertension   . Lumbago   . Neuropathy   . Osteoarthritis   . Psoriasis   . Tremor   . Ulcerative colitis ([place])   . Vertigo     Past Surgical History:  Procedure Laterality Date  . ABDOMINAL HYSTERECTOMY    . APPENDECTOMY    . EYE SURGERY       Social History   Socioeconomic History  . Marital status: Widowed    Spouse name: Not on file  . Number of children: Not on file  . Years of education: Not on file  . Highest education level: Not on file  Occupational History  . Not on file  Social Needs  . Financial resource strain: Not on file  . Food insecurity:    Worry: Not on file    Inability: Not on file  . Transportation needs:    Medical: Not on file    Non-medical: Not on file  Tobacco Use  . Smoking status: Former Research scientist (life sciences)  . Smokeless tobacco: Never Used  Substance and Sexual Activity  . Alcohol use: No  . Drug use: No  . Sexual activity: Never  Lifestyle  . Physical activity:    Days per week: Not on file    Minutes per session: Not on file  . Stress: Not on file  Relationships  . Social connections:    Talks on phone: Not on file    Gets together: Not on file    Attends religious service: Not on file    Active member of club or organization: Not on file    Attends meetings of clubs or organizations: Not on file    Relationship status: Not on file  . Intimate partner violence:    Fear of current or ex partner: Not on file  Emotionally abused: Not on file    Physically abused: Not on file    Forced sexual activity: Not on file  Other Topics Concern  . Not on file  Social History Narrative  . Not on file   Family History  Problem Relation Age of Onset  . Diabetes Mother   . Liver cancer Father       VITAL SIGNS BP 138/74   Pulse 76   Temp 97.6 F (36.4 C)   Resp 18   Ht 5' 2"  (1.575 m)   Wt 152 lb 12.8 oz (69.3 kg)   SpO2 96%   BMI 27.95 kg/m   Outpatient Encounter Medications as of 05/08/2018  Medication Sig  . acetaminophen (TYLENOL) 325 MG tablet Take 650 mg by mouth every 4 (four) hours as needed.  Marland Kitchen alum & mag hydroxide-simeth (MAG-AL PLUS XS) 400-400-40 MG/5ML suspension Take 15 mLs by mouth every 6 (six) hours as needed for indigestion.  Marland Kitchen donepezil (ARICEPT) 5 MG tablet  Take 5 mg by mouth at bedtime.  . DULoxetine (CYMBALTA) 30 MG capsule Take 30 mg by mouth daily.  . Fluticasone-Salmeterol (ADVAIR) 250-50 MCG/DOSE AEPB Inhale 2 puffs into the lungs 2 (two) times daily.   Marland Kitchen guaifenesin (ROBITUSSIN) 100 MG/5ML syrup Take 200 mg by mouth every 6 (six) hours as needed for cough.  . loratadine (CLARITIN) 10 MG tablet Take 10 mg by mouth daily.   Marland Kitchen losartan (COZAAR) 100 MG tablet Take 100 mg by mouth daily.   . magnesium hydroxide (MILK OF MAGNESIA) 400 MG/5ML suspension Take 30 mLs by mouth every 4 (four) hours as needed for mild constipation.  . metFORMIN (GLUCOPHAGE) 500 MG tablet Take 500 mg by mouth daily.  . metoprolol tartrate (LOPRESSOR) 25 MG tablet Take 25 mg by mouth 2 (two) times daily.  . mirtazapine (REMERON) 7.5 MG tablet Take 7.5 mg by mouth at bedtime.  . naproxen (NAPROSYN) 500 MG tablet Take 500 mg by mouth 2 (two) times daily with a meal.  . NON FORMULARY Diet Type: Regular   No facility-administered encounter medications on file as of 05/08/2018.      SIGNIFICANT DIAGNOSTIC EXAMS  PREVIOUS:  02-19-18: chest x-ray: Mild diffuse interstitial pattern to the lungs suggesting edema or interstitial pneumonitis.  NO NEW EXAMS.   LABS REVIEWED TODAY:   02-19-18: wbc 11.2; hgb 13.0; hct 37.4; mcv 91.8; plt 479; glucose 198 bun 19; creat 1.01; k+ 4.5; na++ 133; ca 9.2  BNP  105.0 02-20-18: urine culture: no growth   TODAY:   03-10-18:wbc 7.7; hgb 12.9; hct 39.3; mcv 92.5; plt 471; glucose 154; bun 14; creat 0.91; k+ 4.0; na++ 134 ca 10.1     Review of Systems  Unable to perform ROS: Dementia (poor memory )     Physical Exam  Constitutional: She appears well-developed and well-nourished. No distress.  Neck: No thyromegaly present.  Cardiovascular: Normal rate, regular rhythm, normal heart sounds and intact distal pulses.  Pulmonary/Chest: Effort normal and breath sounds normal. No respiratory distress.  02 dependent   Abdominal: Soft.  Bowel sounds are normal. She exhibits no distension. There is no tenderness.  Musculoskeletal: She exhibits no edema.  Is able to move all extremities   Lymphadenopathy:    She has no cervical adenopathy.  Neurological: She is alert.  Skin: Skin is warm and dry. She is not diaphoretic.  Psychiatric: She has a normal mood and affect.     ASSESSMENT/ PLAN:  TODAY;   1.  Essential hypertension, benign: is stable b/p 138/74; will continue cozaar 100 mg daily; lopressor 25 mg twice daily  2. Chronic obstructive pulmonary disease: is stable is 02 dependent; will continue advair 250/50 2 puffs twice daily claritin 10 mg daily   3. Peripheral neuropathy: is stable will not make changes will monitor   4. Diabetes mellitus type 2 noninsulin dependent: is stable will continue metformin 500 mg daily   PREVIOUS   5. Chronic arthritis: is stable will continue naprosyn 500 mg twice daily   6. Depression and anxiety: is without change: will continue cymbalta 30 mg daily is off xanax   7. Vascular dementia without behavioral disturbance: is without change weight is 152 (02/2018: 165)  pounds  will increase his aricept to 10 mg daily   8. Chronic constipation: is stable will monitor   Time spent with family and patient: 45 minutes ( 25 minutes spent with advanced directives); discussed medications; behaviors; and plan of care; MOST form given to daughter; who will fill out and return. Verbalized understanding.     MD is aware of resident's narcotic use and is in agreement with current plan of care. We will attempt to wean resident as apropriate   Ok Edwards NP [facility]  Contact [phone] Monday through Friday 8am- 5pm  After hours call [phone]

## 2018-05-09 NOTE — ACP (Advance Care Planning) (Signed)
We have discussed Chelsea [name]'s advanced directives We discussed CPR; hospitalizations; abt; ivf; and tube feeding.  The MOST form was given to her daughter to fill out and return  Did verbalize understanding.

## 2018-05-13 ENCOUNTER — Encounter: Payer: Self-pay | Admitting: Adult Health

## 2018-05-13 ENCOUNTER — Non-Acute Institutional Stay (SKILLED_NURSING_FACILITY): Payer: Medicare Other | Admitting: Adult Health

## 2018-05-13 DIAGNOSIS — F418 Other specified anxiety disorders: Secondary | ICD-10-CM

## 2018-05-13 DIAGNOSIS — R451 Restlessness and agitation: Secondary | ICD-10-CM | POA: Diagnosis not present

## 2018-05-13 DIAGNOSIS — F0151 Vascular dementia with behavioral disturbance: Secondary | ICD-10-CM | POA: Diagnosis not present

## 2018-05-13 DIAGNOSIS — M1990 Unspecified osteoarthritis, unspecified site: Secondary | ICD-10-CM | POA: Diagnosis not present

## 2018-05-13 DIAGNOSIS — F01518 Vascular dementia, unspecified severity, with other behavioral disturbance: Secondary | ICD-10-CM

## 2018-05-13 NOTE — Progress Notes (Signed)
[facility]   [facility] Room Number: [place] of Service:  SNF (31)   CODE STATUS:  DNR  Allergies  Allergen Reactions  . Lyrica [Pregabalin] Swelling    Chief Complaint  Patient presents with  . Medical Management of Chronic Issues    Chronic arthritis; vascular dementia with behavior disturbance; depression with anxiety;     HPI:  She is a 84 year old long term resident of this facility being seen for the management of her chronic illnesses: arthritis; dementia; depression with anxiety. She has been frequent periods of time with agitation; she is yelling at cussing at staff members. She is unable to fully participate in the ros; but does deny and uncontrolled and no changes in appetite.   Past Medical History:  Diagnosis Date  . Anemia   . Chronic obstructive asthma ([place])   . COPD (chronic obstructive pulmonary disease) ([place])   . Depression   . Diabetes mellitus without complication ([place])   . Femoral bruit   . GERD without esophagitis   . Hypertension   . Hypertension   . Lumbago   . Neuropathy   . Osteoarthritis   . Psoriasis   . Tremor   . Ulcerative colitis ([place])   . Vertigo     Past Surgical History:  Procedure Laterality Date  . ABDOMINAL HYSTERECTOMY    . APPENDECTOMY    . EYE SURGERY      Social History   Socioeconomic History  . Marital status: Widowed    Spouse name: Not on file  . Number of children: Not on file  . Years of education: Not on file  . Highest education level: Not on file  Occupational History  . Not on file  Social Needs  . Financial resource strain: Not on file  . Food insecurity:    Worry: Not on file    Inability: Not on file  . Transportation needs:    Medical: Not on file    Non-medical: Not on file  Tobacco Use  . Smoking status: Former Research scientist (life sciences)  . Smokeless tobacco: Never Used  Substance and Sexual Activity  . Alcohol use: No  . Drug use: No  . Sexual activity: Never  Lifestyle  .  Physical activity:    Days per week: Not on file    Minutes per session: Not on file  . Stress: Not on file  Relationships  . Social connections:    Talks on phone: Not on file    Gets together: Not on file    Attends religious service: Not on file    Active member of club or organization: Not on file    Attends meetings of clubs or organizations: Not on file    Relationship status: Not on file  . Intimate partner violence:    Fear of current or ex partner: Not on file    Emotionally abused: Not on file    Physically abused: Not on file    Forced sexual activity: Not on file  Other Topics Concern  . Not on file  Social History Narrative  . Not on file   Family History  Problem Relation Age of Onset  . Diabetes Mother   . Liver cancer Father       VITAL SIGNS BP (!) 145/68   Pulse 72   Temp 98.1 F (36.7 C)   Resp 16   Ht 5' 2"  (1.575 m)   Wt 152 lb 12.8 oz (69.3  kg)   SpO2 99%   BMI 27.95 kg/m   Outpatient Encounter Medications as of 05/13/2018  Medication Sig  . acetaminophen (TYLENOL) 325 MG tablet Take 650 mg by mouth every 4 (four) hours as needed.  Marland Kitchen alum & mag hydroxide-simeth (MAG-AL PLUS XS) 400-400-40 MG/5ML suspension Take 15 mLs by mouth every 6 (six) hours as needed for indigestion.  Marland Kitchen donepezil (ARICEPT) 10 MG tablet Take 10 mg by mouth at bedtime.  . DULoxetine (CYMBALTA) 30 MG capsule Take 30 mg by mouth daily.  . Fluticasone-Salmeterol (ADVAIR) 250-50 MCG/DOSE AEPB Inhale 2 puffs into the lungs 2 (two) times daily.   Marland Kitchen guaifenesin (ROBITUSSIN) 100 MG/5ML syrup Take 200 mg by mouth every 6 (six) hours as needed for cough.  . loratadine (CLARITIN) 10 MG tablet Take 10 mg by mouth daily.   Marland Kitchen losartan (COZAAR) 100 MG tablet Take 100 mg by mouth daily.   . magnesium hydroxide (MILK OF MAGNESIA) 400 MG/5ML suspension Take 30 mLs by mouth every 4 (four) hours as needed for mild constipation.  . metFORMIN (GLUCOPHAGE) 500 MG tablet Take 500 mg by mouth  daily.  . metoprolol tartrate (LOPRESSOR) 25 MG tablet Take 25 mg by mouth 2 (two) times daily.  . mirtazapine (REMERON) 7.5 MG tablet Take 7.5 mg by mouth at bedtime.  . naproxen (NAPROSYN) 500 MG tablet Take 500 mg by mouth 2 (two) times daily with a meal.  . NON FORMULARY Diet Type: Regular   No facility-administered encounter medications on file as of 05/13/2018.      SIGNIFICANT DIAGNOSTIC EXAMS  PREVIOUS:  02-19-18: chest x-ray: Mild diffuse interstitial pattern to the lungs suggesting edema or interstitial pneumonitis.  NO NEW EXAMS.   LABS REVIEWED TODAY:   02-19-18: wbc 11.2; hgb 13.0; hct 37.4; mcv 91.8; plt 479; glucose 198 bun 19; creat 1.01; k+ 4.5; na++ 133; ca 9.2  BNP  105.0 02-20-18: urine culture: no growth  03-10-18:wbc 7.7; hgb 12.9; hct 39.3; mcv 92.5; plt 471; glucose 154; bun 14; creat 0.91; k+ 4.0; na++ 134 ca 10.1   NO NEW LABS.    Review of Systems  Unable to perform ROS: Dementia (poor memory )   Physical Exam  Constitutional: She appears well-developed and well-nourished. No distress.  Neck: No thyromegaly present.  Cardiovascular: Normal rate, regular rhythm, normal heart sounds and intact distal pulses.  Pulmonary/Chest: Effort normal and breath sounds normal. No respiratory distress.  02 dependent   Abdominal: Soft. Bowel sounds are normal. She exhibits no distension. There is no tenderness.  Musculoskeletal: She exhibits no edema.  Is able to move all extremities   Lymphadenopathy:    She has no cervical adenopathy.  Neurological: She is alert.  Skin: Skin is warm and dry. She is not diaphoretic.  Psychiatric: She has a normal mood and affect.      ASSESSMENT/ PLAN:  TODAY;   1. Chronic arthritis: is stable will continue naprosyn 500 mg twice daily   2. Depression and anxiety: is without change: will continue cymbalta 30 mg daily; remeron 7.5 mg nightly  is off xanax   3. Vascular dementia with behavioral disturbance: is without  change weight is 152 (02/2018: 165)  pounds  will continue aricept 10 mg daily   4. Agitation: is worse: will begin lamictal 25 mg daily through 05-28-18 then will begin 25 mg twice daily to help stabilize mood.   PREVIOUS   5.  Essential hypertension, benign: is stable b/p 145/68; will continue cozaar 100  mg daily; lopressor 25 mg twice daily  6. Chronic obstructive pulmonary disease: is stable is 02 dependent; will continue advair 250/50 2 puffs twice daily claritin 10 mg daily   7. Peripheral neuropathy: is stable will not make changes will monitor   8. Diabetes mellitus type 2 noninsulin dependent: is stable will continue metformin 500 mg daily   9. Chronic constipation: is stable will monitor   MD is aware of resident's narcotic use and is in agreement with current plan of care. We will attempt to wean resident as apropriate   Ok Edwards NP [facility]  Contact [phone] Monday through Friday 8am- 5pm  After hours call [phone]

## 2018-05-15 DIAGNOSIS — F01518 Vascular dementia, unspecified severity, with other behavioral disturbance: Secondary | ICD-10-CM | POA: Insufficient documentation

## 2018-05-15 DIAGNOSIS — F0151 Vascular dementia with behavioral disturbance: Secondary | ICD-10-CM | POA: Insufficient documentation

## 2018-05-15 DIAGNOSIS — R451 Restlessness and agitation: Secondary | ICD-10-CM | POA: Insufficient documentation

## 2018-06-14 ENCOUNTER — Non-Acute Institutional Stay (SKILLED_NURSING_FACILITY): Payer: Medicare Other | Admitting: Adult Health

## 2018-06-14 ENCOUNTER — Encounter: Payer: Self-pay | Admitting: Adult Health

## 2018-06-14 DIAGNOSIS — I10 Essential (primary) hypertension: Secondary | ICD-10-CM

## 2018-06-14 DIAGNOSIS — G6289 Other specified polyneuropathies: Secondary | ICD-10-CM | POA: Diagnosis not present

## 2018-06-14 DIAGNOSIS — J449 Chronic obstructive pulmonary disease, unspecified: Secondary | ICD-10-CM

## 2018-06-14 NOTE — Progress Notes (Signed)
[facility]   [facility] Room Number: [place] of Service:  SNF (31)   CODE STATUS: DNR  Allergies  Allergen Reactions  . Lyrica [Pregabalin] Swelling    Chief Complaint  Patient presents with  . Medical Management of Chronic Issues    Essential hypertension, benign chronic obstructive pulmonary disease unspecified COPD type; other polyneuropathy.     HPI:  She is a 85 year old long term resident of this facility being seen for the management of her chronic illnesses: hypertension; copd; polyneuropathy. There are no reports of uncontrolled pain; no cough; no shortness of breath; no anxiety or agitation.   Past Medical History:  Diagnosis Date  . Anemia   . Chronic obstructive asthma ([place])   . COPD (chronic obstructive pulmonary disease) ([place])   . Depression   . Diabetes mellitus without complication ([place])   . Femoral bruit   . GERD without esophagitis   . Hypertension   . Hypertension   . Lumbago   . Neuropathy   . Osteoarthritis   . Psoriasis   . Tremor   . Ulcerative colitis ([place])   . Vertigo     Past Surgical History:  Procedure Laterality Date  . ABDOMINAL HYSTERECTOMY    . APPENDECTOMY    . EYE SURGERY      Social History   Socioeconomic History  . Marital status: Widowed    Spouse name: Not on file  . Number of children: Not on file  . Years of education: Not on file  . Highest education level: Not on file  Occupational History  . Not on file  Social Needs  . Financial resource strain: Not on file  . Food insecurity:    Worry: Not on file    Inability: Not on file  . Transportation needs:    Medical: Not on file    Non-medical: Not on file  Tobacco Use  . Smoking status: Former Research scientist (life sciences)  . Smokeless tobacco: Never Used  Substance and Sexual Activity  . Alcohol use: No  . Drug use: No  . Sexual activity: Never  Lifestyle  . Physical activity:    Days per week: Not on file    Minutes per session: Not on file    . Stress: Not on file  Relationships  . Social connections:    Talks on phone: Not on file    Gets together: Not on file    Attends religious service: Not on file    Active member of club or organization: Not on file    Attends meetings of clubs or organizations: Not on file    Relationship status: Not on file  . Intimate partner violence:    Fear of current or ex partner: Not on file    Emotionally abused: Not on file    Physically abused: Not on file    Forced sexual activity: Not on file  Other Topics Concern  . Not on file  Social History Narrative  . Not on file   Family History  Problem Relation Age of Onset  . Diabetes Mother   . Liver cancer Father       VITAL SIGNS BP 132/64   Pulse 84   Temp 98.1 F (36.7 C)   Resp 19   Ht 5' 2"  (1.575 m)   Wt 146 lb (66.2 kg)   SpO2 98%   BMI 26.70 kg/m   Outpatient Encounter Medications as of 06/14/2018  Medication Sig  .  acetaminophen (TYLENOL) 325 MG tablet Take 650 mg by mouth every 4 (four) hours as needed.  Marland Kitchen alum & mag hydroxide-simeth (MAG-AL PLUS XS) 400-400-40 MG/5ML suspension Take 15 mLs by mouth every 6 (six) hours as needed for indigestion.  Marland Kitchen donepezil (ARICEPT) 10 MG tablet Take 10 mg by mouth at bedtime.  . DULoxetine (CYMBALTA) 30 MG capsule Take 30 mg by mouth daily.  . Fluticasone-Salmeterol (ADVAIR) 250-50 MCG/DOSE AEPB Inhale 2 puffs into the lungs 2 (two) times daily.   Marland Kitchen guaifenesin (ROBITUSSIN) 100 MG/5ML syrup Take 200 mg by mouth every 6 (six) hours as needed for cough.  . loratadine (CLARITIN) 10 MG tablet Take 10 mg by mouth daily.   Marland Kitchen losartan (COZAAR) 100 MG tablet Take 100 mg by mouth daily.   . magnesium hydroxide (MILK OF MAGNESIA) 400 MG/5ML suspension Take 30 mLs by mouth every 4 (four) hours as needed for mild constipation.  . Melatonin 3 MG TABS Take 1 tablet by mouth at bedtime.  . metFORMIN (GLUCOPHAGE) 500 MG tablet Take 500 mg by mouth daily.   lamictal 25 mg  Twice daily   .  metoprolol tartrate (LOPRESSOR) 25 MG tablet Take 25 mg by mouth 2 (two) times daily.  . mirtazapine (REMERON) 7.5 MG tablet Take 7.5 mg by mouth at bedtime.  . naproxen (NAPROSYN) 500 MG tablet Take 500 mg by mouth 2 (two) times daily with a meal.  . NON FORMULARY Diet Type: Regular  . phenylephrine-shark liver oil-mineral oil-petrolatum (PREPARATION H) 0.25-14-74.9 % rectal ointment Place 1 application rectally 2 (two) times daily as needed for hemorrhoids.   No facility-administered encounter medications on file as of 06/14/2018.      SIGNIFICANT DIAGNOSTIC EXAMS  PREVIOUS:  02-19-18: chest x-ray: Mild diffuse interstitial pattern to the lungs suggesting edema or interstitial pneumonitis.  NO NEW EXAMS.   LABS REVIEWED TODAY:   02-19-18: wbc 11.2; hgb 13.0; hct 37.4; mcv 91.8; plt 479; glucose 198 bun 19; creat 1.01; k+ 4.5; na++ 133; ca 9.2  BNP  105.0 02-20-18: urine culture: no growth  03-10-18:wbc 7.7; hgb 12.9; hct 39.3; mcv 92.5; plt 471; glucose 154; bun 14; creat 0.91; k+ 4.0; na++ 134 ca 10.1   NO NEW LABS.   Review of Systems  Unable to perform ROS: Dementia (poor memory)    Physical Exam Constitutional:      General: She is not in acute distress.    Appearance: Normal appearance. She is well-developed. She is not diaphoretic.  Neck:     Musculoskeletal: Neck supple.     Thyroid: No thyromegaly.  Cardiovascular:     Rate and Rhythm: Normal rate and regular rhythm.     Pulses: Normal pulses.     Heart sounds: Normal heart sounds.  Pulmonary:     Effort: Pulmonary effort is normal. No respiratory distress.     Breath sounds: Normal breath sounds.     Comments: 02 dependent  Abdominal:     General: Bowel sounds are normal. There is no distension.     Palpations: Abdomen is soft.     Tenderness: There is no abdominal tenderness.  Musculoskeletal:     Right lower leg: No edema.     Left lower leg: No edema.     Comments: Is able to move all extremities     Lymphadenopathy:     Cervical: No cervical adenopathy.  Skin:    General: Skin is warm and dry.  Neurological:     Mental Status: She  is alert. Mental status is at baseline.  Psychiatric:        Mood and Affect: Mood normal.      ASSESSMENT/ PLAN:  TODAY;   1.  Essential hypertension, benign: is stable b/p 132/64; will continue cozaar 100 mg daily; lopressor 25 mg twice daily  2. Chronic obstructive pulmonary disease: is stable is 02 dependent; will continue advair 250/50 2 puffs twice daily claritin 10 mg daily   3. Peripheral neuropathy: is stable will not make changes will monitor   PREVIOUS   4. Diabetes mellitus type 2 noninsulin dependent: is stable will continue metformin 500 mg daily   5. Chronic constipation: is stable will monitor   6. Chronic arthritis: is stable will continue naprosyn 500 mg twice daily   7. Depression and anxiety: is without change: will continue cymbalta 30 mg daily; remeron 7.5 mg nightly  is off xanax   8. Vascular dementia with behavioral disturbance: is without change weight is 146 (02/2018: 152)  pounds  will continue aricept 10 mg daily   9. Agitation:is stable will continue lamictal 25 mg twice daily .   MD is aware of resident's narcotic use and is in agreement with current plan of care. We will attempt to wean resident as apropriate   Ok Edwards NP [facility]  Contact [phone] Monday through Friday 8am- 5pm  After hours call [phone]

## 2018-06-18 ENCOUNTER — Encounter: Payer: Self-pay | Admitting: Adult Health

## 2018-06-18 NOTE — Progress Notes (Signed)
[facility]   [facility] Room Number: [employer] of Service:  SNF (31)   CODE STATUS: DNR  Allergies  Allergen Reactions  . Lyrica [Pregabalin] Swelling    Chief Complaint  Patient presents with  . Acute Visit    Lesion on left thumb    HPI:    Past Medical History:  Diagnosis Date  . Anemia   . Chronic obstructive asthma ([place])   . COPD (chronic obstructive pulmonary disease) ([place])   . Depression   . Diabetes mellitus without complication ([place])   . Femoral bruit   . GERD without esophagitis   . Hypertension   . Hypertension   . Lumbago   . Neuropathy   . Osteoarthritis   . Psoriasis   . Tremor   . Ulcerative colitis ([place])   . Vertigo     Past Surgical History:  Procedure Laterality Date  . ABDOMINAL HYSTERECTOMY    . APPENDECTOMY    . EYE SURGERY      Social History   Socioeconomic History  . Marital status: Widowed    Spouse name: Not on file  . Number of children: Not on file  . Years of education: Not on file  . Highest education level: Not on file  Occupational History  . Not on file  Social Needs  . Financial resource strain: Not on file  . Food insecurity:    Worry: Not on file    Inability: Not on file  . Transportation needs:    Medical: Not on file    Non-medical: Not on file  Tobacco Use  . Smoking status: Former Research scientist (life sciences)  . Smokeless tobacco: Never Used  Substance and Sexual Activity  . Alcohol use: No  . Drug use: No  . Sexual activity: Never  Lifestyle  . Physical activity:    Days per week: Not on file    Minutes per session: Not on file  . Stress: Not on file  Relationships  . Social connections:    Talks on phone: Not on file    Gets together: Not on file    Attends religious service: Not on file    Active member of club or organization: Not on file    Attends meetings of clubs or organizations: Not on file    Relationship status: Not on file  . Intimate partner violence:    Fear of  current or ex partner: Not on file    Emotionally abused: Not on file    Physically abused: Not on file    Forced sexual activity: Not on file  Other Topics Concern  . Not on file  Social History Narrative  . Not on file   Family History  Problem Relation Age of Onset  . Diabetes Mother   . Liver cancer Father       VITAL SIGNS BP 132/64   Pulse 84   Temp 98.1 F (36.7 C)   Resp 19   Ht 5' 2"  (1.575 m)   Wt 146 lb (66.2 kg)   SpO2 98%   BMI 26.70 kg/m   Outpatient Encounter Medications as of 06/18/2018  Medication Sig  . acetaminophen (TYLENOL) 325 MG tablet Take 650 mg by mouth every 4 (four) hours as needed.  Marland Kitchen alum & mag hydroxide-simeth (MAG-AL PLUS XS) 400-400-40 MG/5ML suspension Take 15 mLs by mouth every 6 (six) hours as needed for indigestion.  Marland Kitchen donepezil (ARICEPT) 10 MG tablet Take 10 mg by mouth  at bedtime.  . DULoxetine (CYMBALTA) 30 MG capsule Take 30 mg by mouth daily.  . Fluticasone-Salmeterol (ADVAIR) 250-50 MCG/DOSE AEPB Inhale 2 puffs into the lungs 2 (two) times daily.   Marland Kitchen guaifenesin (ROBITUSSIN) 100 MG/5ML syrup Take 200 mg by mouth every 6 (six) hours as needed for cough.  . lamoTRIgine (LAMICTAL) 25 MG tablet Take 25 mg by mouth daily.  Marland Kitchen loratadine (CLARITIN) 10 MG tablet Take 10 mg by mouth daily.   Marland Kitchen losartan (COZAAR) 100 MG tablet Take 100 mg by mouth daily.   . magnesium hydroxide (MILK OF MAGNESIA) 400 MG/5ML suspension Take 30 mLs by mouth every 4 (four) hours as needed for mild constipation.  . Melatonin 3 MG TABS Take 1 tablet by mouth at bedtime.  . metFORMIN (GLUCOPHAGE) 500 MG tablet Take 500 mg by mouth daily.  . metoprolol tartrate (LOPRESSOR) 25 MG tablet Take 25 mg by mouth 2 (two) times daily.  . mirtazapine (REMERON) 7.5 MG tablet Take 7.5 mg by mouth at bedtime.  . naproxen (NAPROSYN) 500 MG tablet Take 500 mg by mouth 2 (two) times daily with a meal.  . neomycin-bacitracin-polymyxin (NEOSPORIN) [phone] ointment Apply 1  application topically as needed. Apply ointment, cover with band-aid or gauze and tape until healed  . NON FORMULARY Diet Type: Regular  . phenylephrine-shark liver oil-mineral oil-petrolatum (PREPARATION H) 0.25-14-74.9 % rectal ointment Place 1 application rectally 2 (two) times daily as needed for hemorrhoids.   No facility-administered encounter medications on file as of 06/18/2018.      SIGNIFICANT DIAGNOSTIC EXAMS       ASSESSMENT/ PLAN:    MD is aware of resident's narcotic use and is in agreement with current plan of care. We will attempt to wean resident as apropriate   Ok Edwards NP [facility]  Contact [phone] Monday through Friday 8am- 5pm  After hours call [phone]

## 2018-06-22 NOTE — Progress Notes (Signed)
This encounter was created in error - please disregard.

## 2018-06-24 ENCOUNTER — Non-Acute Institutional Stay (SKILLED_NURSING_FACILITY): Payer: Medicare Other | Admitting: Adult Health

## 2018-06-24 ENCOUNTER — Encounter: Payer: Self-pay | Admitting: Adult Health

## 2018-06-24 DIAGNOSIS — L03011 Cellulitis of right finger: Secondary | ICD-10-CM

## 2018-06-24 DIAGNOSIS — E1140 Type 2 diabetes mellitus with diabetic neuropathy, unspecified: Secondary | ICD-10-CM | POA: Diagnosis not present

## 2018-06-24 NOTE — Progress Notes (Signed)
[facility]   [facility] Room Number: [place] of Service:  SNF (31)   CODE STATUS: DNR  Allergies  Allergen Reactions  . Lyrica [Pregabalin] Swelling    Chief Complaint  Patient presents with  . Acute Visit    Dry Scalp, Bilateral Feet pain    HPI:  She is complaining of numbness and burning in her feet. She states that the pain is not being managed and is interfering with her life. On her 3rd right finger she has a pustule present; which is red inflamed and painful to touch. She is denying any itchy dry scalp today. There are no reports of fevers present and no reports of changes in appetite.    Past Medical History:  Diagnosis Date  . Anemia   . Chronic obstructive asthma ([place])   . COPD (chronic obstructive pulmonary disease) ([place])   . Depression   . Diabetes mellitus without complication ([place])   . Femoral bruit   . GERD without esophagitis   . Hypertension   . Hypertension   . Lumbago   . Neuropathy   . Osteoarthritis   . Psoriasis   . Tremor   . Ulcerative colitis ([place])   . Vertigo     Past Surgical History:  Procedure Laterality Date  . ABDOMINAL HYSTERECTOMY    . APPENDECTOMY    . EYE SURGERY      Social History   Socioeconomic History  . Marital status: Widowed    Spouse name: Not on file  . Number of children: Not on file  . Years of education: Not on file  . Highest education level: Not on file  Occupational History  . Not on file  Social Needs  . Financial resource strain: Not on file  . Food insecurity:    Worry: Not on file    Inability: Not on file  . Transportation needs:    Medical: Not on file    Non-medical: Not on file  Tobacco Use  . Smoking status: Former Research scientist (life sciences)  . Smokeless tobacco: Never Used  Substance and Sexual Activity  . Alcohol use: No  . Drug use: No  . Sexual activity: Never  Lifestyle  . Physical activity:    Days per week: Not on file    Minutes per session: Not on file  .  Stress: Not on file  Relationships  . Social connections:    Talks on phone: Not on file    Gets together: Not on file    Attends religious service: Not on file    Active member of club or organization: Not on file    Attends meetings of clubs or organizations: Not on file    Relationship status: Not on file  . Intimate partner violence:    Fear of current or ex partner: Not on file    Emotionally abused: Not on file    Physically abused: Not on file    Forced sexual activity: Not on file  Other Topics Concern  . Not on file  Social History Narrative  . Not on file   Family History  Problem Relation Age of Onset  . Diabetes Mother   . Liver cancer Father       VITAL SIGNS Pulse 76   Temp 97.6 F (36.4 C)   Resp 18   Ht 5' 2"  (1.575 m)   Wt 146 lb (66.2 kg)   SpO2 95%   BMI 26.70 kg/m   Outpatient  Encounter Medications as of 06/24/2018  Medication Sig  . acetaminophen (TYLENOL) 325 MG tablet Take 650 mg by mouth every 4 (four) hours as needed.  Marland Kitchen alum & mag hydroxide-simeth (MAG-AL PLUS XS) 400-400-40 MG/5ML suspension Take 15 mLs by mouth every 6 (six) hours as needed for indigestion.  Marland Kitchen donepezil (ARICEPT) 10 MG tablet Take 10 mg by mouth at bedtime.  . DULoxetine (CYMBALTA) 30 MG capsule Take 30 mg by mouth daily.  . Fluticasone-Salmeterol (ADVAIR) 250-50 MCG/DOSE AEPB Inhale 2 puffs into the lungs 2 (two) times daily.   Marland Kitchen guaifenesin (ROBITUSSIN) 100 MG/5ML syrup Take 200 mg by mouth every 6 (six) hours as needed for cough.  . lamoTRIgine (LAMICTAL) 25 MG tablet Take 25 mg by mouth 2 (two) times daily.   Marland Kitchen loratadine (CLARITIN) 10 MG tablet Take 10 mg by mouth daily.   Marland Kitchen losartan (COZAAR) 100 MG tablet Take 100 mg by mouth daily.   . magnesium hydroxide (MILK OF MAGNESIA) 400 MG/5ML suspension Take 30 mLs by mouth every 4 (four) hours as needed for mild constipation.  . Melatonin 3 MG TABS Take 1 tablet by mouth at bedtime.  . metFORMIN (GLUCOPHAGE) 500 MG tablet  Take 500 mg by mouth daily.  . metoprolol tartrate (LOPRESSOR) 25 MG tablet Take 25 mg by mouth 2 (two) times daily.  . mirtazapine (REMERON) 7.5 MG tablet Take 7.5 mg by mouth at bedtime.  . naproxen (NAPROSYN) 500 MG tablet Take 500 mg by mouth 2 (two) times daily with a meal.  . neomycin-bacitracin-polymyxin (NEOSPORIN) [phone] ointment Apply 1 application topically as needed. Apply ointment, cover with band-aid or gauze and tape until healed  . NON FORMULARY Diet Type: Regular  . phenylephrine-shark liver oil-mineral oil-petrolatum (PREPARATION H) 0.25-14-74.9 % rectal ointment Place 1 application rectally 2 (two) times daily as needed for hemorrhoids.   No facility-administered encounter medications on file as of 06/24/2018.      SIGNIFICANT DIAGNOSTIC EXAMS  PREVIOUS:  02-19-18: chest x-ray: Mild diffuse interstitial pattern to the lungs suggesting edema or interstitial pneumonitis.  NO NEW EXAMS.   LABS REVIEWED TODAY:   02-19-18: wbc 11.2; hgb 13.0; hct 37.4; mcv 91.8; plt 479; glucose 198 bun 19; creat 1.01; k+ 4.5; na++ 133; ca 9.2  BNP  105.0 02-20-18: urine culture: no growth  03-10-18:wbc 7.7; hgb 12.9; hct 39.3; mcv 92.5; plt 471; glucose 154; bun 14; creat 0.91; k+ 4.0; na++ 134 ca 10.1   NO NEW LABS.   Review of Systems  Unable to perform ROS: Dementia (confusion)    Physical Exam Constitutional:      General: She is not in acute distress.    Appearance: She is well-developed. She is not diaphoretic.  Neck:     Musculoskeletal: Neck supple.     Thyroid: No thyromegaly.  Cardiovascular:     Rate and Rhythm: Normal rate and regular rhythm.     Pulses: Normal pulses.     Heart sounds: Normal heart sounds.  Pulmonary:     Effort: Pulmonary effort is normal. No respiratory distress.     Breath sounds: Normal breath sounds.     Comments: 02 dependent  Abdominal:     General: Bowel sounds are normal. There is no distension.     Palpations: Abdomen is soft.      Tenderness: There is no abdominal tenderness.  Musculoskeletal:     Right lower leg: No edema.     Left lower leg: No edema.     Comments:  Is able to move all extremities    Lymphadenopathy:     Cervical: No cervical adenopathy.  Skin:    General: Skin is warm and dry.     Comments: Right 3rd finger has a pustule which is red swollen and painful to touch.   Neurological:     Mental Status: She is alert. Mental status is at baseline.  Psychiatric:        Mood and Affect: Mood normal.      ASSESSMENT/ PLAN:  TODAY;   1. Neuropathy due to type 2 diabetes mellitus 2. Felon of finger of right hand  Will increase cymbalta to 60 mg daily  Will begin keflex 250 mg three times daily through 07-04-18       MD is aware of resident's narcotic use and is in agreement with current plan of care. We will attempt to wean resident as apropriate   Ok Edwards NP [facility]  Contact [phone] Monday through Friday 8am- 5pm  After hours call [phone]

## 2018-06-29 DIAGNOSIS — L03011 Cellulitis of right finger: Secondary | ICD-10-CM | POA: Insufficient documentation

## 2018-06-29 DIAGNOSIS — E1140 Type 2 diabetes mellitus with diabetic neuropathy, unspecified: Secondary | ICD-10-CM | POA: Insufficient documentation

## 2018-07-05 ENCOUNTER — Encounter: Payer: Self-pay | Admitting: Adult Health

## 2018-07-05 ENCOUNTER — Non-Acute Institutional Stay (SKILLED_NURSING_FACILITY): Payer: Medicare Other | Admitting: Adult Health

## 2018-07-05 DIAGNOSIS — E1140 Type 2 diabetes mellitus with diabetic neuropathy, unspecified: Secondary | ICD-10-CM

## 2018-07-05 DIAGNOSIS — K644 Residual hemorrhoidal skin tags: Secondary | ICD-10-CM | POA: Diagnosis not present

## 2018-07-05 NOTE — Progress Notes (Signed)
[facility]   [facility] Room Number: [place] of Service:  SNF (31)   CODE STATUS: DNR  Allergies  Allergen Reactions  . Lyrica [Pregabalin] Swelling    Chief Complaint  Patient presents with  . Acute Visit    Neuropathic Pain    HPI:  She has been having neuropathic pain; her cymbalta was changed to HS to better manage her pain. She states that she does not want any further medications. She would like to weight to see if this medication will work. She is complaining of hemorrhoids which are itching. She is sleeping good at night.   Past Medical History:  Diagnosis Date  . Anemia   . Chronic obstructive asthma ([place])   . COPD (chronic obstructive pulmonary disease) ([place])   . Depression   . Diabetes mellitus without complication ([place])   . Femoral bruit   . GERD without esophagitis   . Hypertension   . Hypertension   . Lumbago   . Neuropathy   . Osteoarthritis   . Psoriasis   . Tremor   . Ulcerative colitis ([place])   . Vertigo     Past Surgical History:  Procedure Laterality Date  . ABDOMINAL HYSTERECTOMY    . APPENDECTOMY    . EYE SURGERY      Social History   Socioeconomic History  . Marital status: Widowed    Spouse name: Not on file  . Number of children: Not on file  . Years of education: Not on file  . Highest education level: Not on file  Occupational History  . Not on file  Social Needs  . Financial resource strain: Not on file  . Food insecurity:    Worry: Not on file    Inability: Not on file  . Transportation needs:    Medical: Not on file    Non-medical: Not on file  Tobacco Use  . Smoking status: Former Research scientist (life sciences)  . Smokeless tobacco: Never Used  Substance and Sexual Activity  . Alcohol use: No  . Drug use: No  . Sexual activity: Never  Lifestyle  . Physical activity:    Days per week: Not on file    Minutes per session: Not on file  . Stress: Not on file  Relationships  . Social connections:    Talks  on phone: Not on file    Gets together: Not on file    Attends religious service: Not on file    Active member of club or organization: Not on file    Attends meetings of clubs or organizations: Not on file    Relationship status: Not on file  . Intimate partner violence:    Fear of current or ex partner: Not on file    Emotionally abused: Not on file    Physically abused: Not on file    Forced sexual activity: Not on file  Other Topics Concern  . Not on file  Social History Narrative  . Not on file   Family History  Problem Relation Age of Onset  . Diabetes Mother   . Liver cancer Father       VITAL SIGNS BP (!) 168/77   Pulse 68   Temp (!) 96 F (35.6 C)   Resp 15   Ht 5' 2"  (1.575 m)   Wt 146 lb (66.2 kg)   SpO2 98%   BMI 26.70 kg/m   Outpatient Encounter Medications as of 07/05/2018  Medication Sig  .  acetaminophen (TYLENOL) 325 MG tablet Take 650 mg by mouth every 4 (four) hours as needed.  Marland Kitchen alum & mag hydroxide-simeth (MAG-AL PLUS XS) 400-400-40 MG/5ML suspension Take 15 mLs by mouth every 6 (six) hours as needed for indigestion.  Marland Kitchen donepezil (ARICEPT) 10 MG tablet Take 10 mg by mouth at bedtime.  . DULoxetine (CYMBALTA) 60 MG capsule Take 60 mg by mouth daily.   . Fluticasone-Salmeterol (ADVAIR) 250-50 MCG/DOSE AEPB Inhale 2 puffs into the lungs 2 (two) times daily.   Marland Kitchen guaifenesin (ROBITUSSIN) 100 MG/5ML syrup Take 200 mg by mouth every 6 (six) hours as needed for cough.  . lamoTRIgine (LAMICTAL) 25 MG tablet Take 25 mg by mouth 2 (two) times daily.   Marland Kitchen loratadine (CLARITIN) 10 MG tablet Take 10 mg by mouth daily.   Marland Kitchen losartan (COZAAR) 100 MG tablet Take 100 mg by mouth daily.   . magnesium hydroxide (MILK OF MAGNESIA) 400 MG/5ML suspension Take 30 mLs by mouth every 4 (four) hours as needed for mild constipation.  . Melatonin 3 MG TABS Take 1 tablet by mouth at bedtime.  . metFORMIN (GLUCOPHAGE) 500 MG tablet Take 500 mg by mouth daily.  . metoprolol  tartrate (LOPRESSOR) 25 MG tablet Take 25 mg by mouth 2 (two) times daily.  . mirtazapine (REMERON) 7.5 MG tablet Take 7.5 mg by mouth at bedtime.  . naproxen (NAPROSYN) 500 MG tablet Take 500 mg by mouth 2 (two) times daily with a meal.  . neomycin-bacitracin-polymyxin (NEOSPORIN) [phone] ointment Apply 1 application topically as needed. Apply ointment, cover with band-aid or gauze and tape until healed  . NON FORMULARY Diet Type: Regular  . OXYGEN Inhale 2 L/min into the lungs at bedtime.  . phenylephrine-shark liver oil-mineral oil-petrolatum (PREPARATION H) 0.25-14-74.9 % rectal ointment Place 1 application rectally 2 (two) times daily as needed for hemorrhoids.   No facility-administered encounter medications on file as of 07/05/2018.      SIGNIFICANT DIAGNOSTIC EXAMS  PREVIOUS:  02-19-18: chest x-ray: Mild diffuse interstitial pattern to the lungs suggesting edema or interstitial pneumonitis.  NO NEW EXAMS.   LABS REVIEWED TODAY:   02-19-18: wbc 11.2; hgb 13.0; hct 37.4; mcv 91.8; plt 479; glucose 198 bun 19; creat 1.01; k+ 4.5; na++ 133; ca 9.2  BNP  105.0 02-20-18: urine culture: no growth  03-10-18:wbc 7.7; hgb 12.9; hct 39.3; mcv 92.5; plt 471; glucose 154; bun 14; creat 0.91; k+ 4.0; na++ 134 ca 10.1   NO NEW LABS.   Review of Systems  Constitutional: Negative for malaise/fatigue.  Respiratory: Negative for cough and shortness of breath.   Cardiovascular: Negative for chest pain, palpitations and leg swelling.  Gastrointestinal: Negative for abdominal pain, constipation and heartburn.       Has hemorrhoid   Musculoskeletal: Negative for back pain, joint pain and myalgias.  Skin: Negative.   Neurological: Negative for dizziness.  Psychiatric/Behavioral: The patient is not nervous/anxious.    Physical Exam Constitutional:      General: She is not in acute distress.    Appearance: She is well-developed. She is not diaphoretic.  Neck:     Thyroid: No thyromegaly.    Cardiovascular:     Rate and Rhythm: Normal rate and regular rhythm.     Heart sounds: Normal heart sounds.  Pulmonary:     Effort: Pulmonary effort is normal. No respiratory distress.     Breath sounds: Normal breath sounds.  Abdominal:     General: Bowel sounds are normal. There is no  distension.     Palpations: Abdomen is soft.     Tenderness: There is no abdominal tenderness.  Genitourinary:    Comments: Has rectal hemorrhoids  Musculoskeletal: Normal range of motion.     Right lower leg: No edema.     Left lower leg: No edema.  Lymphadenopathy:     Cervical: No cervical adenopathy.  Skin:    General: Skin is warm and dry.  Neurological:     Mental Status: She is alert and oriented to person, place, and time.       ASSESSMENT/ PLAN:  TODAY;   1. Neuropathy due to type 2 diabetes mellitus 2. External hemorrhoids  Will continue cymbalta 60 mg nightly  Will begin preparation H twice daily through 07-19-18; will monitor   MD is aware of resident's narcotic use and is in agreement with current plan of care. We will attempt to wean resident as apropriate   Ok Edwards NP [facility]  Contact [phone] Monday through Friday 8am- 5pm  After hours call [phone]

## 2018-07-06 ENCOUNTER — Encounter
Admission: RE | Admit: 2018-07-06 | Discharge: 2018-07-06 | Disposition: A | Discharge status: Home / Self Care | Payer: Medicare Other | Source: Ambulatory Visit | Attending: Internal Medicine | Admitting: Internal Medicine

## 2018-07-09 DIAGNOSIS — K644 Residual hemorrhoidal skin tags: Secondary | ICD-10-CM | POA: Insufficient documentation

## 2018-07-13 ENCOUNTER — Other Ambulatory Visit
Admission: RE | Admit: 2018-07-13 | Discharge: 2018-07-13 | Disposition: A | Discharge status: Home / Self Care | Payer: Medicare Other | Source: Ambulatory Visit | Attending: Internal Medicine | Admitting: Internal Medicine

## 2018-07-13 DIAGNOSIS — R109 Unspecified abdominal pain: Secondary | ICD-10-CM | POA: Diagnosis present

## 2018-07-13 DIAGNOSIS — K921 Melena: Secondary | ICD-10-CM | POA: Insufficient documentation

## 2018-07-13 LAB — CBC WITH DIFFERENTIAL/PLATELET
ABS IMMATURE GRANULOCYTES: 0.05 10*3/uL (ref 0.00–0.07)
BASOS PCT: 1 %
Basophils Absolute: 0.1 10*3/uL (ref 0.0–0.1)
EOS PCT: 2 %
Eosinophils Absolute: 0.3 10*3/uL (ref 0.0–0.5)
HCT: 26.2 % — ABNORMAL LOW (ref 36.0–46.0)
HEMOGLOBIN: 8.2 g/dL — AB (ref 12.0–15.0)
Immature Granulocytes: 0 %
Lymphocytes Relative: 21 %
Lymphs Abs: 2.6 10*3/uL (ref 0.7–4.0)
MCH: 28.3 pg (ref 26.0–34.0)
MCHC: 31.3 g/dL (ref 30.0–36.0)
MCV: 90.3 fL (ref 80.0–100.0)
MONO ABS: 0.8 10*3/uL (ref 0.1–1.0)
MONOS PCT: 7 %
Neutro Abs: 8.1 10*3/uL — ABNORMAL HIGH (ref 1.7–7.7)
Neutrophils Relative %: 69 %
PLATELETS: 396 10*3/uL (ref 150–400)
RBC: 2.9 MIL/uL — AB (ref 3.87–5.11)
RDW: 12.9 % (ref 11.5–15.5)
WBC: 11.9 10*3/uL — AB (ref 4.0–10.5)
nRBC: 0 % (ref 0.0–0.2)

## 2018-07-14 ENCOUNTER — Other Ambulatory Visit
Admission: RE | Admit: 2018-07-14 | Discharge: 2018-07-14 | Disposition: A | Discharge status: Home / Self Care | Payer: Medicare Other | Source: Ambulatory Visit | Attending: Internal Medicine | Admitting: Internal Medicine

## 2018-07-14 DIAGNOSIS — R109 Unspecified abdominal pain: Secondary | ICD-10-CM | POA: Insufficient documentation

## 2018-07-14 DIAGNOSIS — K921 Melena: Secondary | ICD-10-CM | POA: Insufficient documentation

## 2018-07-14 LAB — OCCULT BLOOD X 1 CARD TO LAB, STOOL: Fecal Occult Bld: POSITIVE — AB

## 2018-07-15 ENCOUNTER — Other Ambulatory Visit: Payer: Self-pay

## 2018-07-15 ENCOUNTER — Non-Acute Institutional Stay (SKILLED_NURSING_FACILITY): Payer: Medicare Other | Admitting: Adult Health

## 2018-07-15 ENCOUNTER — Encounter: Payer: Self-pay | Admitting: Intensive Care

## 2018-07-15 ENCOUNTER — Emergency Department: Payer: Medicare Other

## 2018-07-15 ENCOUNTER — Inpatient Hospital Stay
Admission: EM | Admit: 2018-07-15 | Discharge: 2018-07-22 | DRG: 378 | Disposition: A | Discharge status: Skilled Nursing Facility | Payer: Medicare Other | Attending: Internal Medicine | Admitting: Internal Medicine

## 2018-07-15 DIAGNOSIS — K219 Gastro-esophageal reflux disease without esophagitis: Secondary | ICD-10-CM | POA: Diagnosis present

## 2018-07-15 DIAGNOSIS — Z7951 Long term (current) use of inhaled steroids: Secondary | ICD-10-CM

## 2018-07-15 DIAGNOSIS — K649 Unspecified hemorrhoids: Secondary | ICD-10-CM | POA: Diagnosis present

## 2018-07-15 DIAGNOSIS — L409 Psoriasis, unspecified: Secondary | ICD-10-CM | POA: Diagnosis present

## 2018-07-15 DIAGNOSIS — K270 Acute peptic ulcer, site unspecified, with hemorrhage: Principal | ICD-10-CM | POA: Diagnosis present

## 2018-07-15 DIAGNOSIS — K921 Melena: Secondary | ICD-10-CM | POA: Diagnosis not present

## 2018-07-15 DIAGNOSIS — Z833 Family history of diabetes mellitus: Secondary | ICD-10-CM | POA: Diagnosis not present

## 2018-07-15 DIAGNOSIS — Z90710 Acquired absence of both cervix and uterus: Secondary | ICD-10-CM

## 2018-07-15 DIAGNOSIS — F329 Major depressive disorder, single episode, unspecified: Secondary | ICD-10-CM | POA: Diagnosis present

## 2018-07-15 DIAGNOSIS — R51 Headache: Secondary | ICD-10-CM | POA: Diagnosis present

## 2018-07-15 DIAGNOSIS — K449 Diaphragmatic hernia without obstruction or gangrene: Secondary | ICD-10-CM | POA: Diagnosis present

## 2018-07-15 DIAGNOSIS — N179 Acute kidney failure, unspecified: Secondary | ICD-10-CM | POA: Diagnosis present

## 2018-07-15 DIAGNOSIS — Z800 Family history of malignant neoplasm of digestive organs: Secondary | ICD-10-CM | POA: Diagnosis not present

## 2018-07-15 DIAGNOSIS — E871 Hypo-osmolality and hyponatremia: Secondary | ICD-10-CM | POA: Diagnosis present

## 2018-07-15 DIAGNOSIS — Z888 Allergy status to other drugs, medicaments and biological substances status: Secondary | ICD-10-CM

## 2018-07-15 DIAGNOSIS — K5900 Constipation, unspecified: Secondary | ICD-10-CM | POA: Diagnosis present

## 2018-07-15 DIAGNOSIS — Z9981 Dependence on supplemental oxygen: Secondary | ICD-10-CM | POA: Diagnosis not present

## 2018-07-15 DIAGNOSIS — J449 Chronic obstructive pulmonary disease, unspecified: Secondary | ICD-10-CM | POA: Diagnosis present

## 2018-07-15 DIAGNOSIS — Z87891 Personal history of nicotine dependence: Secondary | ICD-10-CM | POA: Diagnosis not present

## 2018-07-15 DIAGNOSIS — K922 Gastrointestinal hemorrhage, unspecified: Secondary | ICD-10-CM | POA: Diagnosis present

## 2018-07-15 DIAGNOSIS — R42 Dizziness and giddiness: Secondary | ICD-10-CM | POA: Diagnosis present

## 2018-07-15 DIAGNOSIS — R2681 Unsteadiness on feet: Secondary | ICD-10-CM | POA: Diagnosis present

## 2018-07-15 DIAGNOSIS — K253 Acute gastric ulcer without hemorrhage or perforation: Secondary | ICD-10-CM | POA: Diagnosis not present

## 2018-07-15 DIAGNOSIS — I10 Essential (primary) hypertension: Secondary | ICD-10-CM | POA: Diagnosis present

## 2018-07-15 DIAGNOSIS — Z79899 Other long term (current) drug therapy: Secondary | ICD-10-CM

## 2018-07-15 DIAGNOSIS — Z7984 Long term (current) use of oral hypoglycemic drugs: Secondary | ICD-10-CM

## 2018-07-15 DIAGNOSIS — D62 Acute posthemorrhagic anemia: Secondary | ICD-10-CM | POA: Diagnosis present

## 2018-07-15 DIAGNOSIS — F0390 Unspecified dementia without behavioral disturbance: Secondary | ICD-10-CM | POA: Diagnosis present

## 2018-07-15 DIAGNOSIS — E1142 Type 2 diabetes mellitus with diabetic polyneuropathy: Secondary | ICD-10-CM | POA: Diagnosis present

## 2018-07-15 DIAGNOSIS — J9611 Chronic respiratory failure with hypoxia: Secondary | ICD-10-CM | POA: Diagnosis present

## 2018-07-15 DIAGNOSIS — Z66 Do not resuscitate: Secondary | ICD-10-CM | POA: Diagnosis present

## 2018-07-15 LAB — CBC
HEMATOCRIT: 24.7 % — AB (ref 36.0–46.0)
Hemoglobin: 7.7 g/dL — ABNORMAL LOW (ref 12.0–15.0)
MCH: 28.6 pg (ref 26.0–34.0)
MCHC: 31.2 g/dL (ref 30.0–36.0)
MCV: 91.8 fL (ref 80.0–100.0)
NRBC: 0 % (ref 0.0–0.2)
PLATELETS: 399 10*3/uL (ref 150–400)
RBC: 2.69 MIL/uL — ABNORMAL LOW (ref 3.87–5.11)
RDW: 13.2 % (ref 11.5–15.5)
WBC: 11.8 10*3/uL — AB (ref 4.0–10.5)

## 2018-07-15 LAB — BASIC METABOLIC PANEL
ANION GAP: 7 (ref 5–15)
BUN: 29 mg/dL — ABNORMAL HIGH (ref 8–23)
CALCIUM: 9.2 mg/dL (ref 8.9–10.3)
CO2: 25 mmol/L (ref 22–32)
CREATININE: 1.45 mg/dL — AB (ref 0.44–1.00)
Chloride: 102 mmol/L (ref 98–111)
GFR calc Af Amer: 38 mL/min — ABNORMAL LOW (ref >60)
GFR, EST NON AFRICAN AMERICAN: 33 mL/min — AB (ref >60)
GLUCOSE: 153 mg/dL — AB (ref 70–99)
Potassium: 4.6 mmol/L (ref 3.5–5.1)
Sodium: 134 mmol/L — ABNORMAL LOW (ref 135–145)

## 2018-07-15 LAB — PROTIME-INR
INR: 1.03
PROTHROMBIN TIME: 13.4 s (ref 11.4–15.2)

## 2018-07-15 LAB — PREPARE RBC (CROSSMATCH)

## 2018-07-15 LAB — TROPONIN I: Troponin I: <0.03 ng/mL (ref <0.03)

## 2018-07-15 LAB — APTT: APTT: 34 s (ref 24–36)

## 2018-07-15 LAB — ABO/RH: ABO/RH(D): O POS

## 2018-07-15 MED ORDER — SENNOSIDES-DOCUSATE SODIUM 8.6-50 MG PO TABS: 1.0000 | ORAL_TABLET | Freq: Every evening | ORAL | Status: DC | PRN

## 2018-07-15 MED ORDER — PHENYLEPH-SHARK LIV OIL-MO-PET 0.25-3-14-71.9 % RE OINT
1.0000 "application " | TOPICAL_OINTMENT | Freq: Two times a day (BID) | RECTAL | Status: DC
  Administered 2018-07-15 – 2018-07-21 (×7): 1 via RECTAL
  Filled 2018-07-15: qty 57

## 2018-07-15 MED ORDER — ACETAMINOPHEN 650 MG RE SUPP: 650.0000 mg | Freq: Four times a day (QID) | RECTAL | Status: DC | PRN

## 2018-07-15 MED ORDER — ALUM & MAG HYDROXIDE-SIMETH 200-200-20 MG/5ML PO SUSP: 15.0000 mL | Freq: Four times a day (QID) | ORAL | Status: DC | PRN

## 2018-07-15 MED ORDER — HYDROCODONE-ACETAMINOPHEN 5-325 MG PO TABS
1.0000 | ORAL_TABLET | ORAL | Status: DC | PRN
  Administered 2018-07-16: 21:00:00 2 via ORAL
  Administered 2018-07-20: 1 via ORAL
  Filled 2018-07-15: qty 2
  Filled 2018-07-15: qty 1

## 2018-07-15 MED ORDER — DONEPEZIL HCL 5 MG PO TABS
10.0000 mg | ORAL_TABLET | Freq: Every day | ORAL | Status: DC
  Administered 2018-07-15 – 2018-07-21 (×7): 10 mg via ORAL
  Filled 2018-07-15 (×7): qty 2

## 2018-07-15 MED ORDER — PANTOPRAZOLE SODIUM 40 MG IV SOLR
40.0000 mg | Freq: Two times a day (BID) | INTRAVENOUS | Status: DC
  Administered 2018-07-15 – 2018-07-16 (×2): 40 mg via INTRAVENOUS
  Filled 2018-07-15 (×2): qty 40

## 2018-07-15 MED ORDER — MIRTAZAPINE 15 MG PO TABS
7.5000 mg | ORAL_TABLET | Freq: Every day | ORAL | Status: DC
  Administered 2018-07-15 – 2018-07-21 (×7): 7.5 mg via ORAL
  Filled 2018-07-15 (×7): qty 1

## 2018-07-15 MED ORDER — GUAIFENESIN 100 MG/5ML PO SYRP
200.0000 mg | ORAL_SOLUTION | Freq: Four times a day (QID) | ORAL | Status: DC | PRN
  Filled 2018-07-15: qty 10

## 2018-07-15 MED ORDER — DULOXETINE HCL 30 MG PO CPEP
60.0000 mg | ORAL_CAPSULE | Freq: Every day | ORAL | Status: DC
  Administered 2018-07-15 – 2018-07-21 (×7): 60 mg via ORAL
  Filled 2018-07-15 (×7): qty 2

## 2018-07-15 MED ORDER — MELATONIN 5 MG PO TABS
5.0000 mg | ORAL_TABLET | Freq: Every day | ORAL | Status: DC
  Administered 2018-07-15 – 2018-07-21 (×7): 5 mg via ORAL
  Filled 2018-07-15 (×8): qty 1

## 2018-07-15 MED ORDER — ONDANSETRON HCL 4 MG PO TABS: 4.0000 mg | ORAL_TABLET | Freq: Four times a day (QID) | ORAL | Status: DC | PRN

## 2018-07-15 MED ORDER — BISACODYL 5 MG PO TBEC
5.0000 mg | DELAYED_RELEASE_TABLET | Freq: Every day | ORAL | Status: DC | PRN
  Administered 2018-07-19 – 2018-07-20 (×2): 5 mg via ORAL
  Filled 2018-07-15 (×2): qty 1

## 2018-07-15 MED ORDER — ONDANSETRON HCL 4 MG/2ML IJ SOLN: 4.0000 mg | Freq: Four times a day (QID) | INTRAMUSCULAR | Status: DC | PRN

## 2018-07-15 MED ORDER — ACETAMINOPHEN 325 MG PO TABS
650.0000 mg | ORAL_TABLET | Freq: Four times a day (QID) | ORAL | Status: DC | PRN
  Administered 2018-07-17 – 2018-07-18 (×2): 650 mg via ORAL
  Filled 2018-07-15 (×2): qty 2

## 2018-07-15 MED ORDER — POTASSIUM CHLORIDE IN NACL 20-0.9 MEQ/L-% IV SOLN
INTRAVENOUS | Status: DC
  Administered 2018-07-15 – 2018-07-17 (×3): via INTRAVENOUS
  Filled 2018-07-15 (×5): qty 1000

## 2018-07-15 MED ORDER — MOMETASONE FURO-FORMOTEROL FUM 200-5 MCG/ACT IN AERO
2.0000 | INHALATION_SPRAY | Freq: Two times a day (BID) | RESPIRATORY_TRACT | Status: DC
  Administered 2018-07-15 – 2018-07-22 (×14): 2 via RESPIRATORY_TRACT
  Filled 2018-07-15: qty 8.8

## 2018-07-15 MED ORDER — SODIUM CHLORIDE 0.9 % IV SOLN
10.0000 mL/h | Freq: Once | INTRAVENOUS | Status: AC
  Administered 2018-07-15: 10 mL/h via INTRAVENOUS

## 2018-07-15 MED ORDER — ALBUTEROL SULFATE (2.5 MG/3ML) 0.083% IN NEBU: 2.5000 mg | INHALATION_SOLUTION | RESPIRATORY_TRACT | Status: DC | PRN

## 2018-07-15 MED ORDER — SODIUM CHLORIDE 0.9 % IV SOLN
1000.0000 mL | Freq: Once | INTRAVENOUS | Status: AC
  Administered 2018-07-16: 1000 mL via INTRAVENOUS

## 2018-07-15 MED ORDER — LAMOTRIGINE 25 MG PO TABS
25.0000 mg | ORAL_TABLET | Freq: Two times a day (BID) | ORAL | Status: DC
  Administered 2018-07-15 – 2018-07-22 (×14): 25 mg via ORAL
  Filled 2018-07-15 (×14): qty 1

## 2018-07-15 MED ORDER — LORATADINE 10 MG PO TABS
10.0000 mg | ORAL_TABLET | Freq: Every day | ORAL | Status: DC
  Administered 2018-07-16 – 2018-07-22 (×7): 10 mg via ORAL
  Filled 2018-07-15 (×7): qty 1

## 2018-07-15 NOTE — ED Triage Notes (Signed)
Patient arrived from [facility] for weakness, lethargic and low hemoglobin. These symptoms started this past weekend. Patient reports she cannot eat but has been drinking liquids. Denies SOB.

## 2018-07-15 NOTE — ED Provider Notes (Signed)
[facility] Emergency Department Provider Note   ____________________________________________    I have reviewed the triage vital signs and the nursing notes.   HISTORY  Chief Complaint Weakness     HPI Chelsea [name] is a 85 y.o. female who presents with weakness, fatigue which is been most pronounced over the last 2 to 3 days.  Sent in by facility for reports of a low hemoglobin.  She does report a history of a hemorrhoid.  Does describe some epigastric discomfort as well.  History of COPD on 2 to 3 L of nasal cannula oxygen at night only.  She reports feeling "wiped out ".no nausea or vomiting.  Has not take anything for this  Past Medical History:  Diagnosis Date  . Anemia   . Chronic obstructive asthma ([place])   . COPD (chronic obstructive pulmonary disease) ([place])   . Depression   . Diabetes mellitus without complication ([place])   . Femoral bruit   . GERD without esophagitis   . Hypertension   . Hypertension   . Lumbago   . Neuropathy   . Osteoarthritis   . Psoriasis   . Tremor   . Ulcerative colitis ([place])   . Vertigo     Patient Active Problem List   Diagnosis Date Noted  . Melena 07/15/2018  . External hemorrhoids 07/09/2018  . Neuropathy due to type 2 diabetes mellitus ([place]) 06/29/2018  . [place] of finger of right hand 06/29/2018  . Vascular dementia with behavior disturbance ([place]) 05/15/2018  . Agitation 05/15/2018  . Essential hypertension, benign 03/02/2018  . Peripheral neuropathy 03/02/2018  . Chronic arthritis 03/02/2018  . Depression with anxiety 03/02/2018  . Chronic constipation 03/02/2018  . Chronic obstructive pulmonary disease (COPD) ([place]) 05/21/2016  . Diabetes mellitus type 2, noninsulin dependent ([place]) 05/21/2016  . Physical deconditioning 05/21/2016    Past Surgical History:  Procedure Laterality Date  . ABDOMINAL HYSTERECTOMY    . APPENDECTOMY    . EYE SURGERY      Prior to Admission medications    Medication Sig Start Date End Date Taking? Authorizing Provider  acetaminophen (TYLENOL) 325 MG tablet Take 650 mg by mouth every 4 (four) hours as needed for mild pain or fever.  02/25/18  Yes [provider]  alum & mag hydroxide-simeth (MAG-AL PLUS XS) 400-400-40 MG/5ML suspension Take 15 mLs by mouth every 6 (six) hours as needed for indigestion. 04/04/18  Yes [provider]  donepezil (ARICEPT) 10 MG tablet Take 10 mg by mouth at bedtime.   Yes [provider]  DULoxetine (CYMBALTA) 60 MG capsule Take 60 mg by mouth at bedtime.  06/24/18  Yes [provider]  Fluticasone-Salmeterol (ADVAIR) 250-50 MCG/DOSE AEPB Inhale 2 puffs into the lungs 2 (two) times daily.    Yes [provider]  guaifenesin (ROBITUSSIN) 100 MG/5ML syrup Take 200 mg by mouth every 6 (six) hours as needed for cough. 04/04/18  Yes [provider]  lamoTRIgine (LAMICTAL) 25 MG tablet Take 25 mg by mouth 2 (two) times daily.  05/29/18  Yes [provider]  loratadine (CLARITIN) 10 MG tablet Take 10 mg by mouth daily.    Yes [provider]  losartan (COZAAR) 100 MG tablet Take 100 mg by mouth daily.    Yes [provider]  magnesium hydroxide (MILK OF MAGNESIA) 400 MG/5ML suspension Take 30 mLs by mouth every 4 (four) hours as needed for mild constipation. 04/04/18  Yes [provider]  Melatonin  3 MG TABS Take 1 tablet by mouth at bedtime. 05/13/18  Yes [provider]  metFORMIN (GLUCOPHAGE-XR) 500 MG 24 hr tablet Take 500 mg by mouth daily with breakfast.   Yes [provider]  metoprolol tartrate (LOPRESSOR) 25 MG tablet Take 25 mg by mouth 2 (two) times daily.   Yes [provider]  mirtazapine (REMERON) 7.5 MG tablet Take 7.5 mg by mouth at bedtime. 04/05/18  Yes [provider]  naproxen (NAPROSYN) 500 MG tablet Take 500 mg by mouth 2 (two) times daily with a meal.   Yes [provider]   NON FORMULARY Diet Type: Regular   Yes [provider]  OXYGEN Inhale 2 L/min into the lungs at bedtime. 07/04/18  Yes [provider]  phenylephrine-shark liver oil-mineral oil-petrolatum (PREPARATION H) 0.25-14-74.9 % rectal ointment Place 1 application rectally every 12 (twelve) hours.  07/05/18 07/19/18 Yes [provider]     Allergies Lyrica [pregabalin]  Family History  Problem Relation Age of Onset  . Diabetes Mother   . Liver cancer Father     Social History Social History   Tobacco Use  . Smoking status: Former Smoker    Types: Cigarettes  . Smokeless tobacco: Never Used  Substance Use Topics  . Alcohol use: No  . Drug use: No    Review of Systems  Constitutional: Severe fatigue Eyes: No visual changes.  ENT: No sore throat. Cardiovascular: Denies chest pain. Respiratory: Denies shortness of breath. Gastrointestinal: As above Genitourinary: Negative for dysuria. Musculoskeletal: Negative for back pain. Skin: Negative for rash. Neurological: Negative for headaches    ____________________________________________   PHYSICAL EXAM:  VITAL SIGNS: ED Triage Vitals  Enc Vitals Group     BP 07/15/18 1342 (!) 91/46     Pulse Rate 07/15/18 1342 67     Resp 07/15/18 1344 16     Temp 07/15/18 1344 97.7 F (36.5 C)     Temp Source 07/15/18 1344 Oral     SpO2 07/15/18 1342 92 %     Weight 07/15/18 1346 65.8 kg (145 lb)     Height 07/15/18 1346 1.575 m (5' 2" )     Head Circumference --      Peak Flow --      Pain Score 07/15/18 1346 0     Pain Loc --      Pain Edu? --      Excl. in [place]? --     Constitutional: Alert and oriented. Eyes: Pallor noted  Nose: No congestion/rhinnorhea. Mouth/Throat: Mucous membranes are moist.    Cardiovascular: Normal rate, regular rhythm. Grossly normal heart sounds.  Good peripheral circulation. Respiratory: Normal respiratory effort.  No retractions. Lungs CTAB. Gastrointestinal: Soft and  nontender. No distention.    Musculoskeletal: .  Warm and well perfused Neurologic:  Normal speech and language. No gross focal neurologic deficits are appreciated.  Skin:  Skin is warm, dry and intact. pale Psychiatric: Mood and affect are normal. Speech and behavior are normal.  ____________________________________________   LABS (all labs ordered are listed, but only abnormal results are displayed)  Labs Reviewed  CBC - Abnormal; Notable for the following components:      Result Value   WBC 11.8 (*)    RBC 2.69 (*)    Hemoglobin 7.7 (*)    HCT 24.7 (*)    All other components within normal limits  APTT  PROTIME-INR  BASIC METABOLIC PANEL  TROPONIN I  TYPE AND SCREEN  PREPARE RBC (  CROSSMATCH)   ____________________________________________  EKG  ED ECG REPORT I, Lavonia Drafts, the attending physician, personally viewed and interpreted this ECG.  Date: 07/15/2018  Rhythm: normal sinus rhythm QRS Axis: normal Intervals: normal ST/T Wave abnormalities: normal  ____________________________________________  RADIOLOGY  None ____________________________________________   PROCEDURES  Procedure(s) performed: No  Procedures   Critical Care performed: yes  CRITICAL CARE Performed by: Lavonia Drafts   Total critical care time: 30 minutes  Critical care time was exclusive of separately billable procedures and treating other patients.  Critical care was necessary to treat or prevent imminent or life-threatening deterioration.  Critical care was time spent personally by me on the following activities: development of treatment plan with patient and/or surrogate as well as nursing, discussions with consultants, evaluation of patient's response to treatment, examination of patient, obtaining history from patient or surrogate, ordering and performing treatments and interventions, ordering and review of laboratory studies, ordering and review of radiographic studies,  pulse oximetry and re-evaluation of patient's condition.  ____________________________________________   INITIAL IMPRESSION / ASSESSMENT AND PLAN / ED COURSE  Pertinent labs & imaging results that were available during my care of the patient were reviewed by me and considered in my medical decision making (see chart for details).  Patient presents with weakness and fatigue, she is pale and her hemoglobin is 7.7 down from over 12 a month ago.  Guaiac positive stool consistent with GI bleed.  Vital signs are notable for borderline blood pressure, will give IV fluids and transfuse PRBC.  Patient will require admission for further evaluation   ____________________________________________   FINAL CLINICAL IMPRESSION(S) / ED DIAGNOSES  Final diagnoses:  Gastrointestinal hemorrhage, unspecified gastrointestinal hemorrhage type        Note:  This document was prepared using Dragon voice recognition software and may include unintentional dictation errors.   Lavonia Drafts, MD 07/15/18 [phone]

## 2018-07-15 NOTE — H&P (Signed)
[place] [facility] [place] NAME: Chelsea [name]    MR#:  [id number]  DATE OF BIRTH:  29-May-1931  DATE OF ADMISSION:  07/15/2018  PRIMARY CARE PHYSICIAN: Juluis Pitch, MD   REQUESTING/REFERRING PHYSICIAN: Dr. Corky Downs.  CHIEF COMPLAINT:   Chief Complaint  Patient presents with  . Weakness   Generalized weakness and melena for 2 days. HISTORY OF PRESENT ILLNESS:  Chelsea [name]  is a 85 y.o. female with a known history of multiple medical problems as below.  The patient has had a generalized weakness for several days.  She noticed black stool last Saturday and today.  She also has headache and dizziness.  She has history of hemorrhoid.  Hemoglobin decreased from 12.9 2 last year to 7.7 today.  Dr. Corky Downs request admission. PAST MEDICAL HISTORY:   Past Medical History:  Diagnosis Date  . Anemia   . Chronic obstructive asthma ([place])   . COPD (chronic obstructive pulmonary disease) ([place])   . Depression   . Diabetes mellitus without complication ([place])   . Femoral bruit   . GERD without esophagitis   . Hypertension   . Hypertension   . Lumbago   . Neuropathy   . Osteoarthritis   . Psoriasis   . Tremor   . Ulcerative colitis ([place])   . Vertigo     PAST SURGICAL HISTORY:   Past Surgical History:  Procedure Laterality Date  . ABDOMINAL HYSTERECTOMY    . APPENDECTOMY    . EYE SURGERY      SOCIAL HISTORY:   Social History   Tobacco Use  . Smoking status: Former Smoker    Types: Cigarettes  . Smokeless tobacco: Never Used  Substance Use Topics  . Alcohol use: No    FAMILY HISTORY:   Family History  Problem Relation Age of Onset  . Diabetes Mother   . Liver cancer Father     DRUG ALLERGIES:   Allergies  Allergen Reactions  . Lyrica [Pregabalin] Swelling    REVIEW OF SYSTEMS:   Review of Systems  Constitutional: Positive for malaise/fatigue. Negative for chills and fever.  HENT: Negative for sore throat.    Eyes: Negative for blurred vision and double vision.  Respiratory: Negative for cough, hemoptysis, shortness of breath, wheezing and stridor.   Cardiovascular: Negative for chest pain, palpitations, orthopnea and leg swelling.  Gastrointestinal: Positive for melena. Negative for abdominal pain, blood in stool, diarrhea, nausea and vomiting.  Genitourinary: Negative for dysuria, flank pain and hematuria.  Musculoskeletal: Negative for back pain and joint pain.  Neurological: Positive for dizziness. Negative for sensory change, focal weakness, seizures, loss of consciousness, weakness and headaches.  Endo/Heme/Allergies: Negative for polydipsia.  Psychiatric/Behavioral: Negative for depression. The patient is not nervous/anxious.     MEDICATIONS AT HOME:   Prior to Admission medications   Medication Sig Start Date End Date Taking? Authorizing Provider  acetaminophen (TYLENOL) 325 MG tablet Take 650 mg by mouth every 4 (four) hours as needed for mild pain or fever.  02/25/18  Yes [provider]  alum & mag hydroxide-simeth (MAG-AL PLUS XS) 400-400-40 MG/5ML suspension Take 15 mLs by mouth every 6 (six) hours as needed for indigestion. 04/04/18  Yes [provider]  donepezil (ARICEPT) 10 MG tablet Take 10 mg by mouth at bedtime.   Yes [provider]  DULoxetine (CYMBALTA) 60 MG capsule Take 60 mg by mouth at bedtime.  06/24/18  Yes [provider]  Fluticasone-Salmeterol (ADVAIR)  250-50 MCG/DOSE AEPB Inhale 2 puffs into the lungs 2 (two) times daily.    Yes [provider]  guaifenesin (ROBITUSSIN) 100 MG/5ML syrup Take 200 mg by mouth every 6 (six) hours as needed for cough. 04/04/18  Yes [provider]  lamoTRIgine (LAMICTAL) 25 MG tablet Take 25 mg by mouth 2 (two) times daily.  05/29/18  Yes [provider]  loratadine (CLARITIN) 10 MG tablet Take 10 mg by mouth daily.    Yes [provider]  losartan (COZAAR) 100 MG  tablet Take 100 mg by mouth daily.    Yes [provider]  magnesium hydroxide (MILK OF MAGNESIA) 400 MG/5ML suspension Take 30 mLs by mouth every 4 (four) hours as needed for mild constipation. 04/04/18  Yes [provider]  Melatonin 3 MG TABS Take 1 tablet by mouth at bedtime. 05/13/18  Yes [provider]  metFORMIN (GLUCOPHAGE-XR) 500 MG 24 hr tablet Take 500 mg by mouth daily with breakfast.   Yes [provider]  metoprolol tartrate (LOPRESSOR) 25 MG tablet Take 25 mg by mouth 2 (two) times daily.   Yes [provider]  mirtazapine (REMERON) 7.5 MG tablet Take 7.5 mg by mouth at bedtime. 04/05/18  Yes [provider]  naproxen (NAPROSYN) 500 MG tablet Take 500 mg by mouth 2 (two) times daily with a meal.   Yes [provider]  NON FORMULARY Diet Type: Regular   Yes [provider]  OXYGEN Inhale 2 L/min into the lungs at bedtime. 07/04/18  Yes [provider]  phenylephrine-shark liver oil-mineral oil-petrolatum (PREPARATION H) 0.25-14-74.9 % rectal ointment Place 1 application rectally every 12 (twelve) hours.  07/05/18 07/19/18 Yes [provider]      VITAL SIGNS:  Blood pressure (!) 91/46, pulse 73, temperature 97.7 F (36.5 C), temperature source Oral, resp. rate 20, height 5' 2"  (1.575 m), weight 65.8 kg, SpO2 98 %.  PHYSICAL EXAMINATION:  Physical Exam  GENERAL:  85 y.o.-year-old patient lying in the bed with no acute distress.  EYES: Pupils equal, round, reactive to light and accommodation. No scleral icterus. Extraocular muscles intact.  Pale conjunctival. HEENT: Head atraumatic, normocephalic. Oropharynx and nasopharynx clear.  NECK:  Supple, no jugular venous distention. No thyroid enlargement, no tenderness.  LUNGS: Normal breath sounds bilaterally, no wheezing, rales,rhonchi or crepitation. No use of accessory muscles of respiration.  CARDIOVASCULAR: S1, S2 normal. No murmurs, rubs, or  gallops.  ABDOMEN: Soft, nontender, nondistended. Bowel sounds present. No organomegaly or mass.  EXTREMITIES: No pedal edema, cyanosis, or clubbing.  NEUROLOGIC: Cranial nerves II through XII are intact. Muscle strength 5/5 in all extremities. Sensation intact. Gait not checked.  PSYCHIATRIC: The patient is alert and oriented x 3.  SKIN: No obvious rash, lesion, or ulcer.   LABORATORY PANEL:   CBC Recent Labs  Lab 07/15/18 1342  WBC 11.8*  HGB 7.7*  HCT 24.7*  PLT 399   ------------------------------------------------------------------------------------------------------------------  Chemistries  Recent Labs  Lab 07/15/18 1342  NA 134*  K 4.6  CL 102  CO2 25  GLUCOSE 153*  BUN 29*  CREATININE 1.45*  CALCIUM 9.2   ------------------------------------------------------------------------------------------------------------------  Cardiac Enzymes Recent Labs  Lab 07/15/18 1342  TROPONINI <0.03   ------------------------------------------------------------------------------------------------------------------  RADIOLOGY:  Dg Chest Portable 1 View  Result Date: 07/15/2018 CLINICAL DATA:  Weakness and low hemoglobin. EXAM: PORTABLE CHEST 1 VIEW COMPARISON:  02/19/2018 FINDINGS: Lungs are somewhat hypoinflated without focal airspace consolidation or effusion. Cardiomediastinal silhouette and remainder of the  exam is unchanged. IMPRESSION: No active disease. Electronically Signed   By: Marin Olp M.D.   On: 07/15/2018 14:10      IMPRESSION AND PLAN:   Symptomatic anemia due to GI bleeding. The patient will be admitted to medical floor. Clear liquid diet for now, n.p.o. after midnight, Protonix IV twice daily, IV fluid support and GI consult.  Hold naproxen. PRBC transfusion and follow-up hemoglobin in a.m.  Acute renal failure.  IV fluid support and follow-up BMP.  Hypertension.  Hold hypertension medication due to low side blood pressure.  Hyponatremia.   Normal saline IV and follow-up BMP.  Informed Dr. Allen Norris. All the records are reviewed and case discussed with ED provider. Management plans discussed with the patient, her daughter and they are in agreement.  CODE STATUS: DNR.  TOTAL TIME TAKING CARE OF THIS PATIENT: 35 minutes.    Demetrios Loll M.D on 07/15/2018 at 3:25 PM  Between 7am to 6pm - Pager - [phone]  After 6pm go to [email or web address] - Proofreader  [facility]  Office  [phone]  CC: Primary care physician; Juluis Pitch, MD   Note: This dictation was prepared with Dragon dictation along with smaller phrase technology. Any transcriptional errors that result from this process are unin

## 2018-07-15 NOTE — ED Notes (Signed)
X-ray at bedside

## 2018-07-15 NOTE — ED Notes (Signed)
ED TO INPATIENT HANDOFF REPORT  Name/Age/Gender Mollie Germany 85 y.o. female  Code Status Code Status History    Date Active Date Inactive Code Status Order ID Comments User Context   05/22/2016 0048 05/23/2016 1919 DNR [id number]  Lance Coon, MD Inpatient   02/06/2015 2155 02/07/2015 2029 DNR [id number]  Bettey Costa, MD Inpatient    Questions for Most Recent Historical Code Status (Order [id number])    Question Answer Comment   In the event of cardiac or respiratory ARREST Do not call a "code blue"    In the event of cardiac or respiratory ARREST Do not perform Intubation, CPR, defibrillation or ACLS    In the event of cardiac or respiratory ARREST Use medication by any route, position, wound care, and other measures to relive pain and suffering. May use oxygen, suction and manual treatment of airway obstruction as needed for comfort.         Advance Directive Documentation     Most Recent Value  Type of Advance Directive  Out of facility DNR (pink MOST or yellow form)  Pre-existing out of facility DNR order (yellow form or pink MOST form)  Yellow form placed in chart (order not valid for inpatient use)  "MOST" Form in Place?  -      Home/SNF/Other Home  Chief Complaint Rectal Bleed  Level of Care/Admitting Diagnosis ED Disposition    ED Disposition Condition [place]: [place] [[id number]]  Level of Care: Med-Surg [16]  Diagnosis: GIB (gastrointestinal bleeding) [564332]  Admitting Physician: Demetrios Loll [[id number]]  Attending Physician: Demetrios Loll [phone]  Estimated length of stay: past midnight tomorrow  Certification:: I certify this patient will need inpatient services for at least 2 midnights  PT Class (Do Not Modify): Inpatient [101]  PT Acc Code (Do Not Modify): Private [1]       Medical History Past Medical History:  Diagnosis Date  . Anemia   . Chronic obstructive asthma ([place])   . COPD (chronic obstructive  pulmonary disease) ([place])   . Depression   . Diabetes mellitus without complication ([place])   . Femoral bruit   . GERD without esophagitis   . Hypertension   . Hypertension   . Lumbago   . Neuropathy   . Osteoarthritis   . Psoriasis   . Tremor   . Ulcerative colitis ([place])   . Vertigo     Allergies Allergies  Allergen Reactions  . Lyrica [Pregabalin] Swelling    IV Location/Drains/Wounds Patient Lines/Drains/Airways Status   Active Line/Drains/Airways    Name:   Placement date:   Placement time:   Site:   Days:   Peripheral IV 07/15/18 Right Arm   07/15/18    1359    Arm   less than 1   Peripheral IV 07/15/18 Left Forearm   07/15/18    1359    Forearm   less than 1          Labs/Imaging Results for orders placed or performed during the hospital encounter of 07/15/18 (from the past 48 hour(s))  Type and screen [place]     Status: None (Preliminary result)   Collection Time: 07/15/18  1:42 PM  Result Value Ref Range   ABO/RH(D) O POS    Antibody Screen NEG    Sample Expiration 07/18/2018    Unit Number [id number]    Blood Component Type RED CELLS,LR    Unit division 00    Status  of Unit ISSUED    Transfusion Status OK TO TRANSFUSE    Crossmatch Result      Compatible Performed at [facility], [address] [place], [place] [address]   APTT     Status: None   Collection Time: 07/15/18  1:42 PM  Result Value Ref Range   aPTT 34 24 - 36 seconds    Comment: Performed at [facility], [address] [place], [place] [address]  Protime-INR     Status: None   Collection Time: 07/15/18  1:42 PM  Result Value Ref Range   Prothrombin Time 13.4 11.4 - 15.2 seconds   INR 1.03     Comment: Performed at [facility], [address] [place], [place] [address]  CBC     Status: Abnormal   Collection Time: 07/15/18  1:42 PM  Result Value Ref Range   WBC 11.8 (H) 4.0 - 10.5 K/uL   RBC 2.69 (L) 3.87 - 5.11 MIL/uL    Hemoglobin 7.7 (L) 12.0 - 15.0 g/dL   HCT 24.7 (L) 36.0 - 46.0 %   MCV 91.8 80.0 - 100.0 fL   MCH 28.6 26.0 - 34.0 pg   MCHC 31.2 30.0 - 36.0 g/dL   RDW 13.2 11.5 - 15.5 %   Platelets 399 150 - 400 K/uL   nRBC 0.0 0.0 - 0.2 %    Comment: Performed at [facility], [address] [place], [place] [address]  Basic metabolic panel     Status: Abnormal   Collection Time: 07/15/18  1:42 PM  Result Value Ref Range   Sodium 134 (L) 135 - 145 mmol/L   Potassium 4.6 3.5 - 5.1 mmol/L   Chloride 102 98 - 111 mmol/L   CO2 25 22 - 32 mmol/L   Glucose, Bld 153 (H) 70 - 99 mg/dL   BUN 29 (H) 8 - 23 mg/dL   Creatinine, Ser 1.45 (H) 0.44 - 1.00 mg/dL   Calcium 9.2 8.9 - 10.3 mg/dL   GFR calc non Af Amer 33 (L) >60 mL/min   GFR calc Af Amer 38 (L) >60 mL/min   Anion gap 7 5 - 15    Comment: Performed at [facility], [address] [place], [place] [address]  Troponin I - ONCE - STAT     Status: None   Collection Time: 07/15/18  1:42 PM  Result Value Ref Range   Troponin I <0.03 <0.03 ng/mL    Comment: Performed at [facility], [address] [place], [place] [address]  Prepare RBC     Status: None   Collection Time: 07/15/18  2:32 PM  Result Value Ref Range   Order Confirmation      ORDER PROCESSED BY BLOOD BANK Performed at [facility], [address], [place], [place] [address]    Dg Chest Portable 1 View  Result Date: 07/15/2018 CLINICAL DATA:  Weakness and low hemoglobin. EXAM: PORTABLE CHEST 1 VIEW COMPARISON:  02/19/2018 FINDINGS: Lungs are somewhat hypoinflated without focal airspace consolidation or effusion. Cardiomediastinal silhouette and remainder of the exam is unchanged. IMPRESSION: No active disease. Electronically Signed   By: Marin Olp M.D.   On: 07/15/2018 14:10    Pending Labs [employer] (From admission, onward)    Start     Ordered   Signed and Occupational hygienist morning,   R     Signed and Held    Signed and Held  CBC  Tomorrow morning,   R     Signed and Held          Vitals/Pain Today's Vitals   07/15/18 1546 07/15/18 1604 07/15/18 1730 07/15/18 1800  BP: 120/61 (!) 100/48 (!) 146/56 122/69  Pulse: 71 71 68 70  Resp: 20 15 18 18   Temp: 97.9 F (36.6 C)     TempSrc: Oral     SpO2: 98% 100% 100% 98%  Weight:      Height:      PainSc:        Isolation Precautions No active isolations  Medications Medications  0.9 %  sodium chloride infusion (has no administration in time range)  0.9 %  sodium chloride infusion (10 mL/hr Intravenous New Bag/Given 07/15/18 1546)    Mobility walks with person assist

## 2018-07-15 NOTE — ED Notes (Signed)
Pt requested ginger ale. Ginger ale provided in accordance with clear liquid diet. Pt resting comfortably.

## 2018-07-15 NOTE — Progress Notes (Signed)
Location:   [place] Room Number: 3 Place of Service:  SNF (31)   CODE STATUS: dnr  Allergies  Allergen Reactions  . Lyrica [Pregabalin] Swelling    Chief Complaint  Patient presents with  . Acute Visit    GI bleed     HPI:  She has had several bloody stools since Saturday. Her hgb has dropped from 12.9 to 8.2.  This AM she has had a bowel movement dark red; diaphoretic; and presyncopal. Staff reports that she is more lethargic; her appetite is poor. There are no reports of fevers present.    Past Medical History:  Diagnosis Date  . Anemia   . Chronic obstructive asthma ([place])   . COPD (chronic obstructive pulmonary disease) ([place])   . Depression   . Diabetes mellitus without complication ([place])   . Femoral bruit   . GERD without esophagitis   . Hypertension   . Hypertension   . Lumbago   . Neuropathy   . Osteoarthritis   . Psoriasis   . Tremor   . Ulcerative colitis ([place])   . Vertigo     Past Surgical History:  Procedure Laterality Date  . ABDOMINAL HYSTERECTOMY    . APPENDECTOMY    . EYE SURGERY      Social History   Socioeconomic History  . Marital status: Widowed    Spouse name: Not on file  . Number of children: Not on file  . Years of education: Not on file  . Highest education level: Not on file  Occupational History  . Not on file  Social Needs  . Financial resource strain: Not on file  . Food insecurity:    Worry: Not on file    Inability: Not on file  . Transportation needs:    Medical: Not on file    Non-medical: Not on file  Tobacco Use  . Smoking status: Former Research scientist (life sciences)  . Smokeless tobacco: Never Used  Substance and Sexual Activity  . Alcohol use: No  . Drug use: No  . Sexual activity: Never  Lifestyle  . Physical activity:    Days per week: Not on file    Minutes per session: Not on file  . Stress: Not on file  Relationships  . Social connections:    Talks on phone: Not on file    Gets together: Not on file     Attends religious service: Not on file    Active member of club or organization: Not on file    Attends meetings of clubs or organizations: Not on file    Relationship status: Not on file  . Intimate partner violence:    Fear of current or ex partner: Not on file    Emotionally abused: Not on file    Physically abused: Not on file    Forced sexual activity: Not on file  Other Topics Concern  . Not on file  Social History Narrative  . Not on file   Family History  Problem Relation Age of Onset  . Diabetes Mother   . Liver cancer Father       VITAL SIGNS BP (!) 88/36   Pulse 70   Resp 16   Ht 5' 2"  (1.575 m)   Wt 146 lb (66.2 kg)   SpO2 93%   BMI 26.70 kg/m   Outpatient Encounter Medications as of 07/15/2018  Medication Sig  . acetaminophen (TYLENOL) 325 MG tablet Take 650 mg by mouth every 4 (four)  hours as needed.  Marland Kitchen alum & mag hydroxide-simeth (MAG-AL PLUS XS) 400-400-40 MG/5ML suspension Take 15 mLs by mouth every 6 (six) hours as needed for indigestion.  Marland Kitchen donepezil (ARICEPT) 10 MG tablet Take 10 mg by mouth at bedtime.  . DULoxetine (CYMBALTA) 60 MG capsule Take 60 mg by mouth at bedtime.   . Fluticasone-Salmeterol (ADVAIR) 250-50 MCG/DOSE AEPB Inhale 2 puffs into the lungs 2 (two) times daily.   Marland Kitchen guaifenesin (ROBITUSSIN) 100 MG/5ML syrup Take 200 mg by mouth every 6 (six) hours as needed for cough.  . lamoTRIgine (LAMICTAL) 25 MG tablet Take 25 mg by mouth 2 (two) times daily.   Marland Kitchen loratadine (CLARITIN) 10 MG tablet Take 10 mg by mouth daily.   Marland Kitchen losartan (COZAAR) 100 MG tablet Take 100 mg by mouth daily.   . magnesium hydroxide (MILK OF MAGNESIA) 400 MG/5ML suspension Take 30 mLs by mouth every 4 (four) hours as needed for mild constipation.  . Melatonin 3 MG TABS Take 1 tablet by mouth at bedtime.  . metFORMIN (GLUCOPHAGE) 500 MG tablet Take 500 mg by mouth daily.  . metoprolol tartrate (LOPRESSOR) 25 MG tablet Take 25 mg by mouth 2 (two) times daily.  .  mirtazapine (REMERON) 7.5 MG tablet Take 7.5 mg by mouth at bedtime.  . naproxen (NAPROSYN) 500 MG tablet Take 500 mg by mouth 2 (two) times daily with a meal.  . neomycin-bacitracin-polymyxin (NEOSPORIN) [phone] ointment Apply 1 application topically as needed. Apply ointment, cover with band-aid or gauze and tape until healed  . NON FORMULARY Diet Type: Regular  . OXYGEN Inhale 2 L/min into the lungs at bedtime.  . phenylephrine-shark liver oil-mineral oil-petrolatum (PREPARATION H) 0.25-14-74.9 % rectal ointment Place 1 application rectally 2 (two) times daily as needed for hemorrhoids.   No facility-administered encounter medications on file as of 07/15/2018.      SIGNIFICANT DIAGNOSTIC EXAMS   PREVIOUS:  02-19-18: chest x-ray: Mild diffuse interstitial pattern to the lungs suggesting edema or interstitial pneumonitis.  NO NEW EXAMS.   LABS REVIEWED TODAY:   02-19-18: wbc 11.2; hgb 13.0; hct 37.4; mcv 91.8; plt 479; glucose 198 bun 19; creat 1.01; k+ 4.5; na++ 133; ca 9.2  BNP  105.0 02-20-18: urine culture: no growth  03-10-18:wbc 7.7; hgb 12.9; hct 39.3; mcv 92.5; plt 471; glucose 154; bun 14; creat 0.91; k+ 4.0; na++ 134 ca 10.1   TODAY;   07-13-18: wbc 11.9; hgb 8.2; hct 26.2; mcv 90.3; plt 396 06-13-18: occult blood: Positive    Review of Systems  Unable to perform ROS: Medical condition    Physical Exam Constitutional:      General: She is not in acute distress.    Appearance: She is well-developed. She is not diaphoretic.  Neck:     Musculoskeletal: Neck supple.     Thyroid: No thyromegaly.  Cardiovascular:     Rate and Rhythm: Normal rate and regular rhythm.     Pulses: Normal pulses.     Heart sounds: Normal heart sounds.  Pulmonary:     Effort: Pulmonary effort is normal. No respiratory distress.     Breath sounds: Normal breath sounds.  Abdominal:     General: Bowel sounds are normal. There is no distension.     Palpations: Abdomen is soft.      Tenderness: There is no abdominal tenderness.  Musculoskeletal:     Right lower leg: No edema.     Left lower leg: No edema.     Comments:  Is able to move all extremities   Lymphadenopathy:     Cervical: No cervical adenopathy.  Skin:    General: Skin is warm.     Coloration: Skin is pale.     Comments: Diaphoretic   Neurological:     Mental Status: She is disoriented.     ASSESSMENT/ PLAN:  TODAY:   1. Melena: is worse: will send to the ED for further workup and treatment options   MD is aware of resident's narcotic use and is in agreement with current plan of care. We will attempt to wean resident as apropriate   Ok Edwards NP [facility]  Contact [phone] Monday through Friday 8am- 5pm  After hours call [phone]

## 2018-07-15 NOTE — ED Notes (Signed)
Patient c/o weakness and lethargic that started on Friday. Reports she has no energy

## 2018-07-16 ENCOUNTER — Inpatient Hospital Stay: Payer: Medicare Other | Admitting: Anesthesiology

## 2018-07-16 ENCOUNTER — Encounter
Admission: EM | Disposition: A | Discharge status: Skilled Nursing Facility | Payer: Self-pay | Source: Home / Self Care | Attending: Internal Medicine

## 2018-07-16 ENCOUNTER — Encounter: Payer: Self-pay | Admitting: Anesthesiology

## 2018-07-16 DIAGNOSIS — K921 Melena: Secondary | ICD-10-CM

## 2018-07-16 DIAGNOSIS — K253 Acute gastric ulcer without hemorrhage or perforation: Secondary | ICD-10-CM

## 2018-07-16 DIAGNOSIS — K922 Gastrointestinal hemorrhage, unspecified: Secondary | ICD-10-CM

## 2018-07-16 HISTORY — PX: ESOPHAGOGASTRODUODENOSCOPY (EGD) WITH PROPOFOL: SHX5813

## 2018-07-16 LAB — CBC
HEMATOCRIT: 26.5 % — AB (ref 36.0–46.0)
HEMOGLOBIN: 8.5 g/dL — AB (ref 12.0–15.0)
MCH: 29.3 pg (ref 26.0–34.0)
MCHC: 32.1 g/dL (ref 30.0–36.0)
MCV: 91.4 fL (ref 80.0–100.0)
Platelets: 308 10*3/uL (ref 150–400)
RBC: 2.9 MIL/uL — ABNORMAL LOW (ref 3.87–5.11)
RDW: 13.4 % (ref 11.5–15.5)
WBC: 9 10*3/uL (ref 4.0–10.5)
nRBC: 0 % (ref 0.0–0.2)

## 2018-07-16 LAB — TYPE AND SCREEN
ABO/RH(D): O POS
ANTIBODY SCREEN: NEGATIVE
Unit division: 0

## 2018-07-16 LAB — BPAM RBC
Blood Product Expiration Date: 202003062359
ISSUE DATE / TIME: 202002101536
UNIT TYPE AND RH: 5100

## 2018-07-16 LAB — BASIC METABOLIC PANEL
ANION GAP: 5 (ref 5–15)
BUN: 29 mg/dL — ABNORMAL HIGH (ref 8–23)
CHLORIDE: 103 mmol/L (ref 98–111)
CO2: 27 mmol/L (ref 22–32)
Calcium: 8.5 mg/dL — ABNORMAL LOW (ref 8.9–10.3)
Creatinine, Ser: 1.11 mg/dL — ABNORMAL HIGH (ref 0.44–1.00)
GFR calc Af Amer: 52 mL/min — ABNORMAL LOW (ref >60)
GFR calc non Af Amer: 45 mL/min — ABNORMAL LOW (ref >60)
Glucose, Bld: 121 mg/dL — ABNORMAL HIGH (ref 70–99)
Potassium: 4.2 mmol/L (ref 3.5–5.1)
Sodium: 135 mmol/L (ref 135–145)

## 2018-07-16 SURGERY — ESOPHAGOGASTRODUODENOSCOPY (EGD) WITH PROPOFOL
Anesthesia: General

## 2018-07-16 MED ORDER — PANTOPRAZOLE SODIUM 40 MG PO TBEC
40.0000 mg | DELAYED_RELEASE_TABLET | Freq: Two times a day (BID) | ORAL | Status: DC
  Administered 2018-07-16 – 2018-07-22 (×13): 40 mg via ORAL
  Filled 2018-07-16 (×13): qty 1

## 2018-07-16 MED ORDER — SODIUM CHLORIDE 0.9 % IV SOLN
INTRAVENOUS | Status: DC | PRN
  Administered 2018-07-16: 11:00:00 via INTRAVENOUS

## 2018-07-16 MED ORDER — PROPOFOL 10 MG/ML IV BOLUS
INTRAVENOUS | Status: DC | PRN
  Administered 2018-07-16: 50 mg via INTRAVENOUS

## 2018-07-16 NOTE — Op Note (Signed)
[facility] Patient Name: Chelsea [name] Procedure Date: 07/16/2018 11:04 AM MRN: [id number] Account #: [id number] Date of Birth: 31-Dec-1930 Admit Type: Outpatient Age: 85 Room: [facility] ENDO ROOM 4 Gender: Female Note Status: Finalized Procedure:            Upper GI endoscopy Indications:          Melena Providers:            [name] MD, MD Referring MD:         No Local Md, MD (Referring MD) Medicines:            Propofol per Anesthesia Complications:        No immediate complications. Procedure:            Pre-Anesthesia Assessment:                       - Prior to the procedure, a History and Physical was                        performed, and patient medications and allergies were                        reviewed. The patient's tolerance of previous                        anesthesia was also reviewed. The risks and benefits of                        the procedure and the sedation options and risks were                        discussed with the patient. All questions were                        answered, and informed consent was obtained. Prior                        Anticoagulants: The patient has taken no previous                        anticoagulant or antiplatelet agents. ASA Grade                        Assessment: II - A patient with mild systemic disease.                        After reviewing the risks and benefits, the patient was                        deemed in satisfactory condition to undergo the                        procedure.                       After obtaining informed consent, the endoscope was                        passed under direct vision. Throughout the procedure,  the patient's blood pressure, pulse, and oxygen                        saturations were monitored continuously. The Endoscope                        was introduced through the mouth, and advanced to the                        second  part of duodenum. The upper GI endoscopy was                        accomplished without difficulty. The patient tolerated                        the procedure well. Findings:      A small hiatal hernia was present.      Two non-bleeding cratered gastric ulcers with no stigmata of bleeding       were found in the prepyloric region of the stomach.      The examined duodenum was normal. Impression:           - Small hiatal hernia.                       - Non-bleeding gastric ulcers with no stigmata of                        bleeding.                       - Normal examined duodenum.                       - No specimens collected. Recommendation:       - Return patient to hospital ward for ongoing care.                       - Advance diet as tolerated.                       - Continue present medications.                       - Avoid NSAID's Procedure Code(s):    --- Professional ---                       [phone], Esophagogastroduodenoscopy, flexible, transoral;                        diagnostic, including collection of specimen(s) by                        brushing or washing, when performed (separate procedure) Diagnosis Code(s):    --- Professional ---                       K92.1, Melena (includes Hematochezia)                       K25.9, Gastric ulcer, unspecified as acute or chronic,  without hemorrhage or perforation CPT copyright 2018 American Medical Association. All rights reserved. The codes documented in this report are preliminary and upon coder review may  be revised to meet current compliance requirements. [name] MD, MD 07/16/2018 11:32:47 AM This report has been signed electronically. Number of Addenda: 0 Note Initiated On: 07/16/2018 11:04 AM      [facility]

## 2018-07-16 NOTE — Anesthesia Preprocedure Evaluation (Signed)
Anesthesia Evaluation  Patient identified by MRN, date of birth, ID band Patient awake    Reviewed: Allergy & Precautions, H&P , NPO status , Patient's Chart, lab work & pertinent test results  History of Anesthesia Complications Negative for: history of anesthetic complications  Airway Mallampati: III  TM Distance: <3 FB Neck ROM: limited    Dental  (+) Upper Dentures, Lower Dentures, Poor Dentition, Implants   Pulmonary shortness of breath and with exertion, asthma , COPD,  oxygen dependent, former smoker,           Cardiovascular hypertension, (-) Past MI      Neuro/Psych PSYCHIATRIC DISORDERS  Neuromuscular disease    GI/Hepatic Neg liver ROS, PUD, GERD  Medicated and Controlled,  Endo/Other  diabetes, Type 2  Renal/GU negative Renal ROS  negative genitourinary   Musculoskeletal  (+) Arthritis ,   Abdominal   Peds  Hematology negative hematology ROS (+)   Anesthesia Other Findings Patient is NPO appropriate and reports no nausea or vomiting today.  Past Medical History: No date: Anemia No date: Chronic obstructive asthma (H[facility]) No date: COPD (chronic obstructive pulmonary disease) ([facility]) No date: Depression No date: Diabetes mellitus without complication ([facility]) No date: Femoral bruit No date: GERD without esophagitis No date: Hypertension No date: Hypertension No date: Lumbago No date: Neuropathy No date: Osteoarthritis No date: Psoriasis No date: Tremor No date: Ulcerative colitis ([place]) No date: Vertigo  Past Surgical History: No date: ABDOMINAL HYSTERECTOMY No date: APPENDECTOMY No date: EYE SURGERY  BMI    Body Mass Index:  26.52 kg/m      Reproductive/Obstetrics negative OB ROS                             Anesthesia Physical Anesthesia Plan  ASA: IV  Anesthesia Plan: General   Post-op Pain Management:    Induction: Intravenous  PONV Risk Score and Plan:  Propofol infusion and TIVA  Airway Management Planned: Natural Airway and Nasal Cannula  Additional Equipment:   Intra-op Plan:   Post-operative Plan:   Informed Consent: I have reviewed the patients History and Physical, chart, labs and discussed the procedure including the risks, benefits and alternatives for the proposed anesthesia with the patient or authorized representative who has indicated his/her understanding and acceptance.   Patient has DNR.  Discussed DNR with patient and Suspend DNR.   Dental Advisory Given  Plan Discussed with: Anesthesiologist, CRNA and Surgeon  Anesthesia Plan Comments: (Patient consented for risks of anesthesia including but not limited to:  - adverse reactions to medications - risk of intubation if required - damage to teeth, lips or other oral mucosa - sore throat or hoarseness - Damage to heart, brain, lungs or loss of life  Patient voiced understanding.)        Anesthesia Quick Evaluation

## 2018-07-16 NOTE — Progress Notes (Signed)
PT Cancellation Note:  Consult received and chart reviewed.  Patient currently off unit for diagnostic procedure.  Will continue efforts at later time/date as patient medically appropriate and available.  Kaelin Bonelli H. Owens Shark, PT, DPT, NCS 07/16/18, 11:16 AM [phone]

## 2018-07-16 NOTE — Transfer of Care (Signed)
Immediate Anesthesia Transfer of Care Note  Patient: Chelsea [name]  Procedure(s) Performed: ESOPHAGOGASTRODUODENOSCOPY (EGD) WITH PROPOFOL (N/A )  Patient Location: PACU  Anesthesia Type:General  Level of Consciousness: sedated  Airway & Oxygen Therapy: Patient Spontanous Breathing and Patient connected to face mask oxygen  Post-op Assessment: Report given to RN and Post -op Vital signs reviewed and stable  Post vital signs: Reviewed and stable  Last Vitals:  Vitals Value Taken Time  BP    Temp    Pulse    Resp    SpO2      Last Pain:  Vitals:   07/16/18 0355  TempSrc: Oral  PainSc:          Complications: No apparent anesthesia complications

## 2018-07-16 NOTE — Anesthesia Post-op Follow-up Note (Signed)
Anesthesia QCDR form completed.        

## 2018-07-16 NOTE — Progress Notes (Signed)
[place] [facility] [place] NAME: Chelsea [name]    MR#:  [id number]  DATE OF BIRTH:  [birth year]  SUBJECTIVE:   Patient here with anemia and melena  REVIEW OF SYSTEMS:    Review of Systems  Constitutional: Negative for fever, chills weight loss HENT: Negative for ear pain, nosebleeds, congestion, facial swelling, rhinorrhea, neck pain, neck stiffness and ear discharge.   Respiratory: Negative for cough, shortness of breath, wheezing  Cardiovascular: Negative for chest pain, palpitations and leg swelling.  Gastrointestinal: Negative for heartburn, abdominal pain, vomiting, diarrhea or consitpation +MELENA Genitourinary: Negative for dysuria, urgency, frequency, hematuria Musculoskeletal: Negative for back pain or joint pain Neurological: Negative for dizziness, seizures, syncope, focal weakness,  numbness and headaches.  Hematological: Does bruise/bleed easily.  Psychiatric/Behavioral: Negative for hallucinations, confusion, dysphoric mood    Tolerating Diet: npo      DRUG ALLERGIES:   Allergies  Allergen Reactions  . Lyrica [Pregabalin] Swelling    VITALS:  Blood pressure 115/66, pulse (!) 115, temperature (!) 97.4 F (36.3 C), temperature source Oral, resp. rate 18, height 5' 2"  (1.575 m), weight 65.8 kg, SpO2 100 %.  PHYSICAL EXAMINATION:  Constitutional: Appears well-developed and well-nourished. No distress. HENT: Normocephalic. Marland Kitchen Oropharynx is clear and moist.  Eyes: Conjunctivae and EOM are normal. PERRLA, no scleral icterus.  Neck: Normal ROM. Neck supple. No JVD. No tracheal deviation. CVS: RRR, S1/S2 +, no murmurs, no gallops, no carotid bruit.  Pulmonary: Effort and breath sounds normal, no stridor, rhonchi, wheezes, rales.  Abdominal: Soft. BS +,  no distension, tenderness, rebound or guarding.  Musculoskeletal: Normal range of motion. No edema and no tenderness.  Neuro: Alert. CN 2-12 grossly intact. No focal  deficits. Skin: Skin is warm and dry. No rash noted. Psychiatric: Normal mood and affect.      LABORATORY PANEL:   CBC Recent Labs  Lab 07/16/18 0416  WBC 9.0  HGB 8.5*  HCT 26.5*  PLT 308   ------------------------------------------------------------------------------------------------------------------  Chemistries  Recent Labs  Lab 07/16/18 0416  NA 135  K 4.2  CL 103  CO2 27  GLUCOSE 121*  BUN 29*  CREATININE 1.11*  CALCIUM 8.5*   ------------------------------------------------------------------------------------------------------------------  Cardiac Enzymes Recent Labs  Lab 07/15/18 1342  TROPONINI <0.03   ------------------------------------------------------------------------------------------------------------------  RADIOLOGY:  Dg Chest Portable 1 View  Result Date: 07/15/2018 CLINICAL DATA:  Weakness and low hemoglobin. EXAM: PORTABLE CHEST 1 VIEW COMPARISON:  02/19/2018 FINDINGS: Lungs are somewhat hypoinflated without focal airspace consolidation or effusion. Cardiomediastinal silhouette and remainder of the exam is unchanged. IMPRESSION: No active disease. Electronically Signed   By: Marin Olp M.D.   On: 07/15/2018 14:10     ASSESSMENT AND PLAN:   85 year old female with history of diabetes and depression who presented to the ER due to generalized weakness.  1.  Acute on chronic symptomatic anemia with upper GI bleed due to naproxen: Patient is status post 1 unit PRBC.  Patient underwent EGD which shows nonbleeding gastric ulcers with no stigmata of bleeding. Patient needs to avoid NSAIDs Continue PPI  2.  Mild dementia: Continue Aricept  3.  Essential hypertension: Holding blood pressure medications due to low/normal blood pressure for now.  If blood pressure improves and may restart in a.m.  4.  Depression: Continue Cymbalta  5  Mild hyponatremia which has improved with IV fluids  6.  Acute kidney injury: This is improved with IV  fluids blood transfusion      Management  plans discussed with the patient and she is in agreement.  CODE STATUS: dnr  TOTAL TIME TAKING CARE OF THIS PATIENT: 30 minutes.     POSSIBLE D/C tomorrow, DEPENDING ON CLINICAL CONDITION.   Rickie Gange M.D on 07/16/2018 at 12:54 PM  Between 7am to 6pm - Pager - [phone] After 6pm go to [email or web address] - password [email or web address] [place]  Office  [phone]  CC: Primary care physician; Juluis Pitch, MD  Note: This dictation was prepared with Dragon dictation along with smaller phrase technology. Any transcriptional errors that result from this process are unintentional.

## 2018-07-16 NOTE — Progress Notes (Signed)
The patient had an EGD with 2 clean-based ulcers in the antrum of the stomach in the prepyloric area.  None of these were bleeding nor was there any sign of recent bleeding.  With a clean-based ulcer the patient should have a very low risk of repeat bleeding unless she continues to take anti-inflammatory medications.  She should be treated with a PPI.  Nothing further to do from a GI point of view.  I will sign off.  Please call if any further GI concerns or questions.  We would like to thank you for the opportunity to participate in the care of [facility].

## 2018-07-16 NOTE — Consult Note (Signed)
Chelsea Lame, MD [facility]  [address], [place], [place] [address] Phone: [phone] Fax : [phone]  Consultation  Referring Provider:     Dr. Bridgett Larsson Primary Care Physician:  Juluis Pitch, MD Primary Gastroenterologist: Althia Forts         Reason for Consultation:     Melena  Date of Admission:  07/15/2018 Date of Consultation:  07/16/2018         HPI:   Chelsea [name] is a 85 y.o. female who was admitted with weakness and found to have a drop in her hemoglobin down to 7.7.  The patient also reports that she has had black stools.  The patient reports that the dark stools have been present for the last few days.  4 months ago the patient's hemoglobin was 12.9 and on admission she was down to 8.2.  The patient's hemoglobin this morning was 8.5.  The patient has neuropathy for which she was started on Naprosyn.  She does not take any aspirin products.  She also denies any abdominal pain associated with this.  There is no report of any unexplained weight loss fevers chills nausea or vomiting.  Past Medical History:  Diagnosis Date  . Anemia   . Chronic obstructive asthma ([place])   . COPD (chronic obstructive pulmonary disease) ([place])   . Depression   . Diabetes mellitus without complication ([place])   . Femoral bruit   . GERD without esophagitis   . Hypertension   . Hypertension   . Lumbago   . Neuropathy   . Osteoarthritis   . Psoriasis   . Tremor   . Ulcerative colitis ([place])   . Vertigo     Past Surgical History:  Procedure Laterality Date  . ABDOMINAL HYSTERECTOMY    . APPENDECTOMY    . EYE SURGERY      Prior to Admission medications   Medication Sig Start Date End Date Taking? Authorizing Provider  acetaminophen (TYLENOL) 325 MG tablet Take 650 mg by mouth every 4 (four) hours as needed for mild pain or fever.  02/25/18  Yes [provider]  alum & mag hydroxide-simeth (MAG-AL PLUS XS) 400-400-40 MG/5ML suspension Take 15 mLs by mouth every 6 (six)  hours as needed for indigestion. 04/04/18  Yes [provider]  donepezil (ARICEPT) 10 MG tablet Take 10 mg by mouth at bedtime.   Yes [provider]  DULoxetine (CYMBALTA) 60 MG capsule Take 60 mg by mouth at bedtime.  06/24/18  Yes [provider]  Fluticasone-Salmeterol (ADVAIR) 250-50 MCG/DOSE AEPB Inhale 2 puffs into the lungs 2 (two) times daily.    Yes [provider]  guaifenesin (ROBITUSSIN) 100 MG/5ML syrup Take 200 mg by mouth every 6 (six) hours as needed for cough. 04/04/18  Yes [provider]  lamoTRIgine (LAMICTAL) 25 MG tablet Take 25 mg by mouth 2 (two) times daily.  05/29/18  Yes [provider]  loratadine (CLARITIN) 10 MG tablet Take 10 mg by mouth daily.    Yes [provider]  losartan (COZAAR) 100 MG tablet Take 100 mg by mouth daily.    Yes [provider]  magnesium hydroxide (MILK OF MAGNESIA) 400 MG/5ML suspension Take 30 mLs by mouth every 4 (four) hours as needed for mild constipation. 04/04/18  Yes [provider]  Melatonin 3 MG TABS Take 1 tablet by mouth at bedtime. 05/13/18  Yes [provider]  metFORMIN (GLUCOPHAGE-XR) 500 MG 24 hr tablet Take 500 mg by mouth  daily with breakfast.   Yes [provider]  metoprolol tartrate (LOPRESSOR) 25 MG tablet Take 25 mg by mouth 2 (two) times daily.   Yes [provider]  mirtazapine (REMERON) 7.5 MG tablet Take 7.5 mg by mouth at bedtime. 04/05/18  Yes [provider]  naproxen (NAPROSYN) 500 MG tablet Take 500 mg by mouth 2 (two) times daily with a meal.   Yes [provider]  NON FORMULARY Diet Type: Regular   Yes [provider]  OXYGEN Inhale 2 L/min into the lungs at bedtime. 07/04/18  Yes [provider]  phenylephrine-shark liver oil-mineral oil-petrolatum (PREPARATION H) 0.25-14-74.9 % rectal ointment Place 1 application rectally every 12 (twelve) hours.  07/05/18 07/19/18 Yes  [provider]    Family History  Problem Relation Age of Onset  . Diabetes Mother   . Liver cancer Father      Social History   Tobacco Use  . Smoking status: Former Smoker    Types: Cigarettes  . Smokeless tobacco: Never Used  Substance Use Topics  . Alcohol use: No  . Drug use: No    Allergies as of 07/15/2018 - Review Complete 07/15/2018  Allergen Reaction Noted  . Lyrica [pregabalin] Swelling 02/06/2015    Review of Systems:    All systems reviewed and negative except where noted in HPI.   Physical Exam:  Vital signs in last 24 hours: Temp:  [96.8 F (36 C)-97.9 F (36.6 C)] 97.4 F (36.3 C) (02/11 1218) Pulse Rate:  [68-115] 115 (02/11 1218) Resp:  [14-21] 18 (02/11 1218) BP: (100-169)/(47-87) 115/66 (02/11 1218) SpO2:  [96 %-100 %] 100 % (02/11 1151) Last BM Date: 07/15/18 General:   Pleasant, cooperative in NAD Head:  Normocephalic and atraumatic. Eyes:   No icterus.   Conjunctiva pink. PERRLA. Ears:  Normal auditory acuity. Neck:  Supple; no masses or thyroidomegaly Lungs: Respirations even and unlabored. Lungs clear to auscultation bilaterally.   No wheezes, crackles, or rhonchi.  Heart:  Regular rate and rhythm;  Without murmur, clicks, rubs or gallops Abdomen:  Soft, nondistended, nontender. Normal bowel sounds. No appreciable masses or hepatomegaly.  No rebound or guarding.  Rectal:  Not performed. Msk:  Symmetrical without gross deformities.   Extremities:  Without edema, cyanosis or clubbing. Neurologic:  Alert and oriented x3;  grossly normal neurologically. Skin:  Intact without significant lesions or rashes. Cervical Nodes:  No significant cervical adenopathy. Psych:  Alert and cooperative. Normal affect.  LAB RESULTS: Recent Labs    07/13/18 1800 07/15/18 1342 07/16/18 0416  WBC 11.9* 11.8* 9.0  HGB 8.2* 7.7* 8.5*  HCT 26.2* 24.7* 26.5*  PLT 396 399 308   BMET Recent Labs    07/15/18 1342 07/16/18 0416  NA 134* 135   K 4.6 4.2  CL 102 103  CO2 25 27  GLUCOSE 153* 121*  BUN 29* 29*  CREATININE 1.45* 1.11*  CALCIUM 9.2 8.5*   LFT No results for input(s): PROT, ALBUMIN, AST, ALT, ALKPHOS, BILITOT, BILIDIR, IBILI in the last 72 hours. PT/INR Recent Labs    07/15/18 1342  LABPROT 13.4  INR 1.03    STUDIES: Dg Chest Portable 1 View  Result Date: 07/15/2018 CLINICAL DATA:  Weakness and low hemoglobin. EXAM: PORTABLE CHEST 1 VIEW COMPARISON:  02/19/2018 FINDINGS: Lungs are somewhat hypoinflated without focal airspace consolidation or effusion. Cardiomediastinal silhouette and remainder of the exam is unchanged. IMPRESSION: No active disease. Electronically Signed   By: Marin Olp M.D.  On: 07/15/2018 14:10      Impression / Plan:   Assessment: Active Problems:   GIB (gastrointestinal bleeding)   Blood in stool   Acute peptic ulcer of stomach   Emme Furno is a 85 y.o. y/o female with black stools on anti-inflammatory medication for neuropathy.  The patient likely has an upper source of her GI bleed with anemia and melena.  Plan:  The patient will be set up for an EGD for today.  The patient will need to stay off the NSAIDs due to her likely peptic ulcer disease as a cause of her NSAIDs.  The patient has been explained the plan and agrees with undergoing an EGD today.  Thank you for involving me in the care of this patient.      LOS: 1 day   Chelsea Lame, MD  07/16/2018, 2:06 PM    Note: This dictation was prepared with Dragon dictation along with smaller phrase technology. Any transcriptional errors that result from this process are unintentional.

## 2018-07-17 ENCOUNTER — Encounter: Payer: Self-pay | Admitting: Gastroenterology

## 2018-07-17 LAB — CBC
HCT: 25.8 % — ABNORMAL LOW (ref 36.0–46.0)
Hemoglobin: 8.2 g/dL — ABNORMAL LOW (ref 12.0–15.0)
MCH: 29.4 pg (ref 26.0–34.0)
MCHC: 31.8 g/dL (ref 30.0–36.0)
MCV: 92.5 fL (ref 80.0–100.0)
Platelets: 308 10*3/uL (ref 150–400)
RBC: 2.79 MIL/uL — ABNORMAL LOW (ref 3.87–5.11)
RDW: 13.5 % (ref 11.5–15.5)
WBC: 7.5 10*3/uL (ref 4.0–10.5)
nRBC: 0 % (ref 0.0–0.2)

## 2018-07-17 LAB — GLUCOSE, CAPILLARY
Glucose-Capillary: 237 mg/dL — ABNORMAL HIGH (ref 70–99)
Glucose-Capillary: 113 mg/dL — ABNORMAL HIGH (ref 70–99)
Glucose-Capillary: 121 mg/dL — ABNORMAL HIGH (ref 70–99)

## 2018-07-17 MED ORDER — INSULIN ASPART 100 UNIT/ML ~~LOC~~ SOLN
0.0000 [IU] | Freq: Three times a day (TID) | SUBCUTANEOUS | Status: DC
  Administered 2018-07-17: 3 [IU] via SUBCUTANEOUS
  Administered 2018-07-18: 13:00:00 2 [IU] via SUBCUTANEOUS
  Administered 2018-07-18 – 2018-07-19 (×3): 1 [IU] via SUBCUTANEOUS
  Administered 2018-07-20: 18:00:00 2 [IU] via SUBCUTANEOUS
  Administered 2018-07-20 – 2018-07-21 (×3): 1 [IU] via SUBCUTANEOUS
  Administered 2018-07-22: 12:00:00 2 [IU] via SUBCUTANEOUS
  Filled 2018-07-17 (×10): qty 1

## 2018-07-17 MED ORDER — METFORMIN HCL 500 MG PO TABS
500.0000 mg | ORAL_TABLET | Freq: Two times a day (BID) | ORAL | Status: DC
  Administered 2018-07-17 – 2018-07-22 (×10): 500 mg via ORAL
  Filled 2018-07-17 (×11): qty 1

## 2018-07-17 MED ORDER — METOPROLOL TARTRATE 25 MG PO TABS
25.0000 mg | ORAL_TABLET | Freq: Two times a day (BID) | ORAL | Status: DC
  Administered 2018-07-17 – 2018-07-22 (×11): 25 mg via ORAL
  Filled 2018-07-17 (×11): qty 1

## 2018-07-17 MED ORDER — LOSARTAN POTASSIUM 50 MG PO TABS
50.0000 mg | ORAL_TABLET | Freq: Every day | ORAL | Status: DC
  Administered 2018-07-17 – 2018-07-22 (×6): 50 mg via ORAL
  Filled 2018-07-17 (×6): qty 1

## 2018-07-17 NOTE — Clinical Social Work Note (Signed)
CSW spoke with patient's daughter Saree Krogh at bedside. CSW presented bed offers to daughter. Daughter states that she needs some time to think about offers before making a decision. CSW provided medicare compare list of facilities. CSW also provided phone number for contact when decision has been made. CSW will continue to follow for discharge planning.   [place], [place]

## 2018-07-17 NOTE — Progress Notes (Signed)
[facility] [place] NAME: Chelsea [name]    MR#:  [id number]  DATE OF BIRTH:  [birth year]  SUBJECTIVE:  CHIEF COMPLAINT:   Chief Complaint  Patient presents with  . Weakness   -Admitted with melena and anemia -Status post upper endoscopy showing clean gastric ulcers.  Received 1 unit transfusion this admission and hemoglobin at 8  REVIEW OF SYSTEMS:  Review of Systems  Constitutional: Positive for malaise/fatigue. Negative for chills and fever.  HENT: Negative for congestion, ear discharge, hearing loss and nosebleeds.   Eyes: Negative for blurred vision and double vision.  Respiratory: Negative for cough, shortness of breath and wheezing.   Cardiovascular: Negative for chest pain and palpitations.  Gastrointestinal: Positive for melena. Negative for abdominal pain, constipation, diarrhea, nausea and vomiting.  Genitourinary: Negative for dysuria.  Musculoskeletal: Positive for joint pain and myalgias.  Neurological: Negative for dizziness, focal weakness, seizures, weakness and headaches.  Psychiatric/Behavioral: Negative for depression.    DRUG ALLERGIES:   Allergies  Allergen Reactions  . Lyrica [Pregabalin] Swelling    VITALS:  Blood pressure (!) 142/59, pulse 77, temperature (!) 97.4 F (36.3 C), temperature source Oral, resp. rate 16, height 5' 2"  (1.575 m), weight 65.8 kg, SpO2 93 %.  PHYSICAL EXAMINATION:  Physical Exam   GENERAL:  85 y.o.-year-old elderly patient lying in the bed with no acute distress.  EYES: Pupils equal, round, reactive to light and accommodation. No scleral icterus. Extraocular muscles intact.  HEENT: Head atraumatic, normocephalic. Oropharynx and nasopharynx clear.  NECK:  Supple, no jugular venous distention. No thyroid enlargement, no tenderness.  LUNGS: Normal breath sounds bilaterally, no wheezing, rales,rhonchi or crepitation. No use of accessory muscles of respiration.  Decreased  bibasilar breath sounds CARDIOVASCULAR: S1, S2 normal. No  rubs, or gallops.  2/6 systolic murmur is present ABDOMEN: Soft, nontender, nondistended. Bowel sounds present. No organomegaly or mass.  EXTREMITIES: No pedal edema, cyanosis, or clubbing.  NEUROLOGIC: Cranial nerves II through XII are intact. Muscle strength 5/5 in both upper extremities and 4/5 in both lower extremities. Sensation intact. Gait not checked.  PSYCHIATRIC: The patient is alert and oriented x 3.  SKIN: No obvious rash, lesion, or ulcer.    LABORATORY PANEL:   CBC Recent Labs  Lab 07/17/18 0433  WBC 7.5  HGB 8.2*  HCT 25.8*  PLT 308   ------------------------------------------------------------------------------------------------------------------  Chemistries  Recent Labs  Lab 07/16/18 0416  NA 135  K 4.2  CL 103  CO2 27  GLUCOSE 121*  BUN 29*  CREATININE 1.11*  CALCIUM 8.5*   ------------------------------------------------------------------------------------------------------------------  Cardiac Enzymes Recent Labs  Lab 07/15/18 1342  TROPONINI <0.03   ------------------------------------------------------------------------------------------------------------------  RADIOLOGY:  Dg Chest Portable 1 View  Result Date: 07/15/2018 CLINICAL DATA:  Weakness and low hemoglobin. EXAM: PORTABLE CHEST 1 VIEW COMPARISON:  02/19/2018 FINDINGS: Lungs are somewhat hypoinflated without focal airspace consolidation or effusion. Cardiomediastinal silhouette and remainder of the exam is unchanged. IMPRESSION: No active disease. Electronically Signed   By: Marin Olp M.D.   On: 07/15/2018 14:10    EKG:   Orders placed or performed during the hospital encounter of 07/15/18  . ED EKG  . ED EKG  . EKG 12-Lead  . EKG 12-Lead    ASSESSMENT AND PLAN:   85 year old female with past medical history significant for peripheral neuropathy causing falls, hypertension, GERD, diabetes, COPD and vertigo  presenting from [facility] long-term care secondary to weakness and noted to have a  hemoglobin drop on admission.  1.  Acute on chronic anemia-likely iron deficiency anemia given her melena and intake of NSAIDs twice a day for several years. -Appreciate GI consult.  Status post EGD showing clean antral ulcers in the stomach with no active bleeding. -Continue PPI at discharge.  Hemoglobin is stable after 1 unit transfusion at 8.2 -No active bleeding noted.  Patient also has some occasional hemorrhoidal bleeding. -No NSAIDs -According to family, patient did have a colonoscopy a few years ago that showed polyps and diverticulosis and hemorrhoids -Outpatient GI follow-up recommended.  2.  Severe peripheral neuropathy-which is the reason for patient's unsteady gait and falls and she is in a wheelchair at baseline. -Continue gabapentin and Cymbalta.  Discontinue Aleve and start tramadol  3.  Depression-continue Remeron, Lamictal  4.  Hypertension-metoprolol and Cozaar are on hold, restart medications.  5.  Diabetes mellitus-restart Glucophage.  6.  DVT prophylaxis-teds and SCDs  Since [facility] is closing, family need to choose another place for patient to go to rehab. -Updated daughter at bedside   All the records are reviewed and case discussed with Care Management/Social Workerr. Management plans discussed with the patient, family and they are in agreement.  CODE STATUS: DNR  TOTAL TIME TAKING CARE OF THIS PATIENT: 39 minutes.   POSSIBLE D/C IN 1-2 DAYS, DEPENDING ON CLINICAL CONDITION.   Gladstone Lighter M.D on 07/17/2018 at 12:46 PM  Between 7am to 6pm - Pager - [phone]  After 6pm go to [email or web address] - password [email or web address] [facility]  Office  [phone]  CC: Primary care physician; Juluis Pitch, MD

## 2018-07-17 NOTE — Clinical Social Work Note (Signed)
Clinical Social Work Assessment  Patient Details  Name: Chelsea [name] MRN: [id number] Date of Birth: [birth year]  Date of referral:  07/17/18               Reason for consult:  Facility Placement                Permission sought to share information with:  Case Manager, Customer service manager, Family Supports Permission granted to share information::  Yes, Verbal Permission Granted  Name::      SNF  Agency::   [facility]   Relationship::     Contact Information:     Housing/Transportation Living arrangements for the past 2 months:  [place] of Information:  Adult Children Patient Interpreter Needed:  None Criminal Activity/Legal Involvement Pertinent to Current Situation/Hospitalization:  No - Comment as needed Significant Relationships:  Adult Children Lives with:  Facility Resident Do you feel safe going back to the place where you live?  Yes Need for family participation in patient care:  Yes (Comment)  Care giving concerns:  Patient lives at [facility]    Social Worker assessment / plan:  CSW consulted for facility placement. Patient is a long term resident at [facility] but they are in the process of closing and patient needs to relocate to a new facility. CSW spoke with Lovena Le at [facility] who states that patient could potentially go to rehab from hospital which would be an easier transition than going back to [place] and then transitioning. CSW met with patient and daughter Chelsea [name] at bedside about discharge plan. Daughter and patient are not happy about transitioning to a new facility. Daughter states that she would like patient referral to be sent out to see what is available before making a decision. CSW will begin bed search and give offers as available. CSW will continue to follow for discharge planning.   Employment status:  Retired Nurse, adult PT Recommendations:  Not assessed at this  time Information / Referral to community resources:  [place]  Patient/Family's Response to care:  Patient and daughter thanked CSW for assistance.   Patient/Family's Understanding of and Emotional Response to Diagnosis, Current Treatment, and Prognosis:  Daughter and patient state understanding of current treatment and discharge plan   Emotional Assessment Appearance:  Appears stated age Attitude/Demeanor/Rapport:  Guarded Affect (typically observed):  Withdrawn Orientation:  Oriented to Self, Oriented to Place, Oriented to  Time Alcohol / Substance use:  Not Applicable Psych involvement (Current and /or in the community):  No (Comment)  Discharge Needs  Concerns to be addressed:  Discharge Planning Concerns Readmission within the last 30 days:  No Current discharge risk:  Dependent with Mobility Barriers to Discharge:  Continued Medical Work up   [employer], [place] 07/17/2018, 9:38 AM

## 2018-07-17 NOTE — Evaluation (Signed)
Physical Therapy Evaluation Patient Details Name: Chelsea [name] MRN: [id number] DOB: 10-15-1930 Today's Date: 07/17/2018   History of Present Illness  presented to ER secondary to weakness, melena; admitted for management of symptomatic anemia due to GIB.  EGD (2/11) suggestive of 2 clean-based ulcers, no active bleeding.  Clinical Impression  Upon evaluation, patient alert and oriented; follows commands and demonstrates good effort with session.  Bilat UE/LE strength and ROM grossly symmetrical and WFL for basic transfers and mobility; no focal weakness appreciated.  Currently requiring min assist for bed mobility; min/mod assist for sit/stand, basic transfers with bilat UE support. Poor standing balance, very limited righting reactions, requiring +1 physical assist at all times.  Transfer status does improve to min assist +1 with RW, with patient reporting improved comfort/confidence with use of assist device.  Do recommend continued use of RW with all transfers attempts at this time. Of note, patient sats >92% on RA throughout session; left on RA end of session (as patient does not wear during the day at baseline).  Nursing informed/aware. Would benefit from skilled PT to address above deficits and promote optimal return to PLOF; recommend transition to STR upon discharge from acute hospitalization.     Follow Up Recommendations SNF    Equipment Recommendations       Recommendations for Other Services       Precautions / Restrictions Precautions Precautions: Fall Restrictions Weight Bearing Restrictions: No      Mobility  Bed Mobility Overal bed mobility: Needs Assistance Bed Mobility: Supine to Sit     Supine to sit: Min assist        Transfers Overall transfer level: Needs assistance Equipment used: Rolling walker (2 wheeled) Transfers: Sit to/from Stand Sit to Stand: Min assist;Mod assist         General transfer comment: broad BOS with poor foot clearance,  limited heel strike/toe off; does require UE support and +1 at all times due to poor balance/righting reactions  Ambulation/Gait             General Gait Details: deferred; non-ambulatory at baseline  Stairs            Wheelchair Mobility    Modified Rankin (Stroke Patients Only)       Balance Overall balance assessment: Needs assistance Sitting-balance support: No upper extremity supported;Feet supported Sitting balance-Leahy Scale: Good     Standing balance support: Bilateral upper extremity supported Standing balance-Leahy Scale: Poor                               Pertinent Vitals/Pain Pain Assessment: Faces Faces Pain Scale: Hurts a little bit Pain Location: soreness in bilat ankles/feet Pain Descriptors / Indicators: Aching;Sore Pain Intervention(s): Limited activity within patient's tolerance;Monitored during session;Repositioned    Home Living Family/patient expects to be discharged to:: Skilled nursing facility                 Additional Comments: Patient is long-term resident of [facility] (since Oct, 2019)    Prior Function Level of Independence: Needs assistance         Comments: WC level as primary mobility, using bilat UEs/LEs to self-propel manual chair; SPT (holding armrests) with +1 assist for transfers to/from seating surfaces     Hand Dominance        Extremity/Trunk Assessment   Upper Extremity Assessment Upper Extremity Assessment: Overall WFL for tasks assessed(grossly 4-/5 throughout)    Lower Extremity  Assessment Lower Extremity Assessment: Generalized weakness(grossly 4-/5 throughout)       Communication      Cognition Arousal/Alertness: Awake/alert Behavior During Therapy: WFL for tasks assessed/performed Overall Cognitive Status: Within Functional Limits for tasks assessed                                        General Comments      Exercises Other Exercises Other Exercises: SPT,  bed/recliner, with RW, min assist.  Improved confidence and and control, improved balance  Continue to recommend +1 assist, but requriing less physical support than during trial without RW   Assessment/Plan    PT Assessment Patient needs continued PT services  PT Problem List Decreased strength;Decreased activity tolerance;Decreased balance;Decreased mobility;Decreased safety awareness       PT Treatment Interventions DME instruction;Gait training;Functional mobility training;Therapeutic activities;Therapeutic exercise;Balance training;Cognitive remediation;Patient/family education    PT Goals (Current goals can be found in the Care Plan section)  Acute Rehab PT Goals Patient Stated Goal: agreeable to session PT Goal Formulation: With patient/family Time For Goal Achievement: 07/31/18 Potential to Achieve Goals: Good Additional Goals Additional Goal #1: Assess and establish additional mobility goals as appropriate.    Frequency Min 2X/week   Barriers to discharge Decreased caregiver support      Co-evaluation               AM-PAC PT "6 Clicks" Mobility  Outcome Measure Help needed turning from your back to your side while in a flat bed without using bedrails?: A Little Help needed moving from lying on your back to sitting on the side of a flat bed without using bedrails?: A Little Help needed moving to and from a bed to a chair (including a wheelchair)?: A Little Help needed standing up from a chair using your arms (e.g., wheelchair or bedside chair)?: A Little Help needed to walk in hospital room?: A Lot Help needed climbing 3-5 steps with a railing? : Total 6 Click Score: 15    End of Session Equipment Utilized During Treatment: Gait belt Activity Tolerance: Patient tolerated treatment well Patient left: in chair;with call bell/phone within reach;with chair alarm set;with family/visitor present Nurse Communication: Mobility status PT Visit Diagnosis: Muscle weakness  (generalized) (M62.81);Difficulty in walking, not elsewhere classified (R26.2)    Time: 3704-8889 PT Time Calculation (min) (ACUTE ONLY): 30 min   Charges:   PT Evaluation $PT Eval Moderate Complexity: 1 Mod PT Treatments $Therapeutic Activity: 8-22 mins        [place] Andrus H. Owens Shark, PT, DPT, NCS 07/17/18, 10:34 AM [phone]

## 2018-07-17 NOTE — Anesthesia Postprocedure Evaluation (Signed)
Anesthesia Post Note  Patient: Theatre stage manager  Procedure(s) Performed: ESOPHAGOGASTRODUODENOSCOPY (EGD) WITH PROPOFOL (N/A )  Patient location during evaluation: Endoscopy Anesthesia Type: General Level of consciousness: awake and alert Pain management: pain level controlled Vital Signs Assessment: post-procedure vital signs reviewed and stable Respiratory status: spontaneous breathing, nonlabored ventilation, respiratory function stable and patient connected to nasal cannula oxygen Cardiovascular status: blood pressure returned to baseline and stable Postop Assessment: no apparent nausea or vomiting Anesthetic complications: no     Last Vitals:  Vitals:   07/16/18 1940 07/17/18 0343  BP: (!) 145/53 (!) 142/59  Pulse: 95 77  Resp: 16   Temp: 36.6 C (!) 36.3 C  SpO2: 93% 96%    Last Pain:  Vitals:   07/17/18 0343  TempSrc: Oral  PainSc: 0-No pain                 Precious Haws Nnamdi Dacus

## 2018-07-17 NOTE — NC FL2 (Signed)
[place] LEVEL OF CARE SCREENING TOOL     IDENTIFICATION  Patient Name: Chelsea [name] Birthdate: [birth year] Sex: female Admission Date (Current Location): 07/15/2018  [place] and [place] Number:  Engineering geologist and Address:  [facility], [address], [place], [place] [address]      Provider Number: [id number]  Attending Physician Name and Address:  Gladstone Lighter, MD  Relative Name and Phone Number:       Current Level of Care: Hospital Recommended Level of Care: [place] Prior Approval Number:    Date Approved/Denied:   PASRR Number: [id number]  Discharge Plan: SNF    Current Diagnoses: Patient Active Problem List   Diagnosis Date Noted  . Blood in stool   . Acute peptic ulcer of stomach   . Melena 07/15/2018  . GIB (gastrointestinal bleeding) 07/15/2018  . External hemorrhoids 07/09/2018  . Neuropathy due to type 2 diabetes mellitus ([place]) 06/29/2018  . [place]on of finger of right hand 06/29/2018  . Vascular dementia with behavior disturbance ([place]) 05/15/2018  . Agitation 05/15/2018  . Essential hypertension, benign 03/02/2018  . Peripheral neuropathy 03/02/2018  . Chronic arthritis 03/02/2018  . Depression with anxiety 03/02/2018  . Chronic constipation 03/02/2018  . Chronic obstructive pulmonary disease (COPD) ([place]) 05/21/2016  . Diabetes mellitus type 2, noninsulin dependent ([place]) 05/21/2016  . Physical deconditioning 05/21/2016    Orientation RESPIRATION BLADDER Height & Weight     Self, Time, Place  O2(2 liters ) Incontinent Weight: 145 lb (65.8 kg) Height:  5' 2"  (157.5 cm)  BEHAVIORAL SYMPTOMS/MOOD NEUROLOGICAL BOWEL NUTRITION STATUS  (none) (none) Continent Diet(Regular )  AMBULATORY STATUS COMMUNICATION OF NEEDS Skin   Extensive Assist Verbally Normal                       Personal Care Assistance Level of Assistance  Bathing, Feeding, Dressing Bathing Assistance: Limited  assistance Feeding assistance: Independent Dressing Assistance: Limited assistance     Functional Limitations Info  Sight, Hearing, Speech Sight Info: Adequate Hearing Info: Adequate Speech Info: Adequate    SPECIAL CARE FACTORS FREQUENCY  PT (By licensed PT), OT (By licensed OT)                    Contractures Contractures Info: Not present    Additional Factors Info  Code Status, Allergies Code Status Info: DNR Allergies Info: Lyrica            Current Medications (07/17/2018):  This is the current hospital active medication list Current Facility-Administered Medications  Medication Dose Route Frequency Provider Last Rate Last Dose  . 0.9 % NaCl with KCl 20 mEq/ L  infusion   Intravenous Continuous [name], MD 75 mL/hr at 07/17/18 0448    . acetaminophen (TYLENOL) tablet 650 mg  650 mg Oral Q6H PRN [name], MD       Or  . acetaminophen (TYLENOL) suppository 650 mg  650 mg Rectal Q6H PRN [name], MD      . albuterol (PROVENTIL) (2.5 MG/3ML) 0.083% nebulizer solution 2.5 mg  2.5 mg Nebulization Q2H PRN [name], MD      . alum & mag hydroxide-simeth (MAALOX/MYLANTA) 200-200-20 MG/5ML suspension 15 mL  15 mL Oral Q6H PRN [name], MD      . bisacodyl (DULCOLAX) EC tablet 5 mg  5 mg Oral Daily PRN [name], MD      . donepezil (ARICEPT) tablet 10 mg  10 mg Oral QHS [name], MD   10 mg at 07/16/18 2120  . DULoxetine (CYMBALTA) DR capsule 60 mg  60 mg Oral QHS [name], MD   60 mg at 07/16/18 2120  . guaifenesin (ROBITUSSIN) 100 MG/5ML syrup 200 mg  200 mg Oral Q6H PRN [name], MD      . HYDROcodone-acetaminophen (NORCO/VICODIN) 5-325 MG per tablet 1-2 tablet  1-2 tablet Oral Q4H PRN [name], MD   2 tablet at 07/16/18 2120  . lamoTRIgine (LAMICTAL) tablet 25 mg  25 mg Oral BID [name], MD   25 mg at 07/17/18 0901  . loratadine (CLARITIN) tablet 10 mg  10 mg Oral Daily [name], MD   10 mg at 07/17/18 0901  . Melatonin  TABS 5 mg  5 mg Oral QHS [name], MD   5 mg at 07/16/18 2120  . mirtazapine (REMERON) tablet 7.5 mg  7.5 mg Oral QHS [name], MD   7.5 mg at 07/16/18 2120  . mometasone-formoterol (DULERA) 200-5 MCG/ACT inhaler 2 puff  2 puff Inhalation BID [name], MD   2 puff at 07/17/18 0859  . ondansetron (ZOFRAN) tablet 4 mg  4 mg Oral Q6H PRN [name], MD       Or  . ondansetron (ZOFRAN) injection 4 mg  4 mg Intravenous Q6H PRN [name], MD      . pantoprazole (PROTONIX) EC tablet 40 mg  40 mg Oral BID Bettey Costa, MD   40 mg at 07/17/18 0901  . phenylephrine-shark liver oil-mineral oil-petrolatum (PREPARATION H) rectal ointment 1 application  1 application Rectal Z61W [name], MD   1 application at 96/04/54 0904  . senna-docusate (Senokot-S) tablet 1 tablet  1 tablet Oral QHS PRN [name], MD         Discharge Medications: Please see discharge summary for a list of discharge medications.  Relevant Imaging Results:  Relevant Lab Results:   Additional Information SSN: [id number]  Annamaria Boots, [place]

## 2018-07-18 LAB — GLUCOSE, CAPILLARY
Glucose-Capillary: 115 mg/dL — ABNORMAL HIGH (ref 70–99)
Glucose-Capillary: 169 mg/dL — ABNORMAL HIGH (ref 70–99)
Glucose-Capillary: 132 mg/dL — ABNORMAL HIGH (ref 70–99)
Glucose-Capillary: 128 mg/dL — ABNORMAL HIGH (ref 70–99)

## 2018-07-18 LAB — CBC
HEMATOCRIT: 26.4 % — AB (ref 36.0–46.0)
Hemoglobin: 8.4 g/dL — ABNORMAL LOW (ref 12.0–15.0)
MCH: 29.4 pg (ref 26.0–34.0)
MCHC: 31.8 g/dL (ref 30.0–36.0)
MCV: 92.3 fL (ref 80.0–100.0)
Platelets: 340 10*3/uL (ref 150–400)
RBC: 2.86 MIL/uL — ABNORMAL LOW (ref 3.87–5.11)
RDW: 13.4 % (ref 11.5–15.5)
WBC: 8.1 10*3/uL (ref 4.0–10.5)
nRBC: 0 % (ref 0.0–0.2)

## 2018-07-18 MED ORDER — TRAMADOL HCL 50 MG PO TABS
50.0000 mg | ORAL_TABLET | Freq: Four times a day (QID) | ORAL | Status: DC | PRN
  Administered 2018-07-18 (×2): 50 mg via ORAL
  Filled 2018-07-18 (×2): qty 1

## 2018-07-18 NOTE — Care Management Important Message (Signed)
Important Message  Patient Details  Name: Chelsea [name] MRN: [id number] Date of Birth: [birth year]   Medicare Important Message Given:  Yes    Juliann Pulse A Alicea Wente 07/18/2018, 11:27 AM

## 2018-07-18 NOTE — Progress Notes (Signed)
[place] at [place] NAME: Chelsea [name]    MR#:  [id number]  DATE OF BIRTH:  June 19, 1930  SUBJECTIVE:  CHIEF COMPLAINT:   Chief Complaint  Patient presents with  . Weakness   - doing well, no complaints today  REVIEW OF SYSTEMS:  Review of Systems  Constitutional: Positive for malaise/fatigue. Negative for chills and fever.  HENT: Negative for congestion, ear discharge, hearing loss and nosebleeds.   Eyes: Negative for blurred vision and double vision.  Respiratory: Negative for cough, shortness of breath and wheezing.   Cardiovascular: Negative for chest pain and palpitations.  Gastrointestinal: Positive for melena. Negative for abdominal pain, constipation, diarrhea, nausea and vomiting.  Genitourinary: Negative for dysuria.  Musculoskeletal: Positive for joint pain and myalgias.  Neurological: Negative for dizziness, focal weakness, seizures, weakness and headaches.  Psychiatric/Behavioral: Negative for depression.    DRUG ALLERGIES:   Allergies  Allergen Reactions  . Lyrica [Pregabalin] Swelling    VITALS:  Blood pressure (!) 163/52, pulse 70, temperature 97.8 F (36.6 C), temperature source Oral, resp. rate 17, height 5' 2"  (1.575 m), weight 65.8 kg, SpO2 94 %.  PHYSICAL EXAMINATION:  Physical Exam   GENERAL:  85 y.o.-year-old elderly patient lying in the bed with no acute distress.  EYES: Pupils equal, round, reactive to light and accommodation. No scleral icterus. Extraocular muscles intact.  HEENT: Head atraumatic, normocephalic. Oropharynx and nasopharynx clear.  NECK:  Supple, no jugular venous distention. No thyroid enlargement, no tenderness.  LUNGS: Normal breath sounds bilaterally, no wheezing, rales,rhonchi or crepitation. No use of accessory muscles of respiration.  Decreased bibasilar breath sounds CARDIOVASCULAR: S1, S2 normal. No  rubs, or gallops.  2/6 systolic murmur is present ABDOMEN: Soft,  nontender, nondistended. Bowel sounds present. No organomegaly or mass.  EXTREMITIES: No pedal edema, cyanosis, or clubbing.  NEUROLOGIC: Cranial nerves II through XII are intact. Muscle strength 5/5 in both upper extremities and 4/5 in both lower extremities. Sensation intact. Gait not checked.  PSYCHIATRIC: The patient is alert and oriented x 3.  SKIN: No obvious rash, lesion, or ulcer.    LABORATORY PANEL:   CBC Recent Labs  Lab 07/18/18 0520  WBC 8.1  HGB 8.4*  HCT 26.4*  PLT 340   ------------------------------------------------------------------------------------------------------------------  Chemistries  Recent Labs  Lab 07/16/18 0416  NA 135  K 4.2  CL 103  CO2 27  GLUCOSE 121*  BUN 29*  CREATININE 1.11*  CALCIUM 8.5*   ------------------------------------------------------------------------------------------------------------------  Cardiac Enzymes Recent Labs  Lab 07/15/18 1342  TROPONINI <0.03   ------------------------------------------------------------------------------------------------------------------  RADIOLOGY:  No results found.  EKG:   Orders placed or performed during the hospital encounter of 07/15/18  . ED EKG  . ED EKG  . EKG 12-Lead  . EKG 12-Lead    ASSESSMENT AND PLAN:   85 year old female with past medical history significant for peripheral neuropathy causing falls, hypertension, GERD, diabetes, COPD and vertigo presenting from [facility] long-term care secondary to weakness and noted to have a hemoglobin drop on admission.  1.  Acute on chronic anemia-likely iron deficiency anemia given her melena and intake of NSAIDs twice a day for several years. -Appreciate GI consult.  Status post EGD showing clean antral ulcers in the stomach with no active bleeding. -Continue PPI at discharge.  Hemoglobin is stable after 1 unit transfusion at 8.2 -No active bleeding noted.  Patient also has some occasional hemorrhoidal bleeding. -No  NSAIDs -According to family, patient did have a  colonoscopy a few years ago that showed polyps and diverticulosis and hemorrhoids -Outpatient GI follow-up recommended.  2.  Severe peripheral neuropathy-which is the reason for patient's unsteady gait and falls and she is in a wheelchair at baseline. -Continue gabapentin and Cymbalta.  Discontinue Aleve and started tramadol  3.  Depression-continue Remeron, Lamictal  4.  Hypertension-metoprolol and Cozaar    5.  Diabetes mellitus- Glucophage.  6.  DVT prophylaxis-teds and SCDs  Since [facility] is closing, family need to choose another place for patient to go to rehab.     All the records are reviewed and case discussed with Care Management/Social Workerr. Management plans discussed with the patient, family and they are in agreement.  CODE STATUS: DNR  TOTAL TIME TAKING CARE OF THIS PATIENT: 29 minutes.   POSSIBLE D/C IN 1-2 DAYS, DEPENDING ON CLINICAL CONDITION.   Gladstone Lighter M.D on 07/18/2018 at 1:36 PM  Between 7am to 6pm - Pager - [phone]  After 6pm go to [email or web address] - password [email or web address] [place] [facility]  Office  [phone]  CC: Primary care physician; Juluis Pitch, MD

## 2018-07-19 LAB — GLUCOSE, CAPILLARY
Glucose-Capillary: 103 mg/dL — ABNORMAL HIGH (ref 70–99)
Glucose-Capillary: 141 mg/dL — ABNORMAL HIGH (ref 70–99)
Glucose-Capillary: 150 mg/dL — ABNORMAL HIGH (ref 70–99)
Glucose-Capillary: 104 mg/dL — ABNORMAL HIGH (ref 70–99)

## 2018-07-19 MED ORDER — DOCUSATE SODIUM 100 MG PO CAPS
100.0000 mg | ORAL_CAPSULE | Freq: Two times a day (BID) | ORAL | Status: DC
  Administered 2018-07-19 – 2018-07-22 (×7): 100 mg via ORAL
  Filled 2018-07-19 (×7): qty 1

## 2018-07-19 MED ORDER — LOSARTAN POTASSIUM 50 MG PO TABS: 50.0000 mg | ORAL_TABLET | Freq: Every day | ORAL | 0 refills | Status: DC

## 2018-07-19 MED ORDER — TRAMADOL HCL 50 MG PO TABS: 50.0000 mg | ORAL_TABLET | Freq: Four times a day (QID) | ORAL | 0 refills | Status: DC | PRN

## 2018-07-19 MED ORDER — MAGNESIUM HYDROXIDE 400 MG/5ML PO SUSP: 30.0000 mL | Freq: Every day | ORAL | 0 refills | Status: DC | PRN

## 2018-07-19 NOTE — Clinical Social Work Note (Signed)
Patient's daughter spoke with CSW today and states that they have chosen for patient to go to [facility] at discharge. CSW notified Otila Kluver at [facility] of bed acceptance and need for [facility] authorization. CSW will continue to follow for discharge planning.   [place], [place]

## 2018-07-19 NOTE — Clinical Social Work Note (Signed)
CSW spoke with Tammy at [employer]. Tammy states that they have not received [facility] authorization yet. Patient can not be discharged until authorization has been obtained. CSW notified MD and patient's family of change. CSW will continue to follow for discharge planning.   [place], [place]

## 2018-07-19 NOTE — Progress Notes (Signed)
[place] at [place] NAME: Chelsea [name]    MR#:  [id number]  DATE OF BIRTH:  Nov 29, 1930  SUBJECTIVE:  CHIEF COMPLAINT:   Chief Complaint  Patient presents with  . Weakness   - doing well, no complaints today - awaiting insurance auth for transfer to rehab  REVIEW OF SYSTEMS:  Review of Systems  Constitutional: Positive for malaise/fatigue. Negative for chills and fever.  HENT: Negative for congestion, ear discharge, hearing loss and nosebleeds.   Eyes: Negative for blurred vision and double vision.  Respiratory: Negative for cough, shortness of breath and wheezing.   Cardiovascular: Negative for chest pain and palpitations.  Gastrointestinal: Positive for melena. Negative for abdominal pain, constipation, diarrhea, nausea and vomiting.  Genitourinary: Negative for dysuria.  Musculoskeletal: Positive for joint pain and myalgias.  Neurological: Negative for dizziness, focal weakness, seizures, weakness and headaches.  Psychiatric/Behavioral: Negative for depression.    DRUG ALLERGIES:   Allergies  Allergen Reactions  . Lyrica [Pregabalin] Swelling    VITALS:  Blood pressure 122/71, pulse 87, temperature 98.3 F (36.8 C), temperature source Oral, resp. rate 18, height 5' 2"  (1.575 m), weight 65.8 kg, SpO2 96 %.  PHYSICAL EXAMINATION:  Physical Exam   GENERAL:  85 y.o.-year-old elderly patient lying in the bed with no acute distress.  EYES: Pupils equal, round, reactive to light and accommodation. No scleral icterus. Extraocular muscles intact.  HEENT: Head atraumatic, normocephalic. Oropharynx and nasopharynx clear.  NECK:  Supple, no jugular venous distention. No thyroid enlargement, no tenderness.  LUNGS: Normal breath sounds bilaterally, no wheezing, rales,rhonchi or crepitation. No use of accessory muscles of respiration.  Decreased bibasilar breath sounds CARDIOVASCULAR: S1, S2 normal. No  rubs, or gallops.  2/6  systolic murmur is present ABDOMEN: Soft, nontender, nondistended. Bowel sounds present. No organomegaly or mass.  EXTREMITIES: No pedal edema, cyanosis, or clubbing.  NEUROLOGIC: Cranial nerves II through XII are intact. Muscle strength 5/5 in both upper extremities and 4/5 in both lower extremities. Sensation intact. Gait not checked.  PSYCHIATRIC: The patient is alert and oriented x 3.  SKIN: No obvious rash, lesion, or ulcer.    LABORATORY PANEL:   CBC Recent Labs  Lab 07/18/18 0520  WBC 8.1  HGB 8.4*  HCT 26.4*  PLT 340   ------------------------------------------------------------------------------------------------------------------  Chemistries  Recent Labs  Lab 07/16/18 0416  NA 135  K 4.2  CL 103  CO2 27  GLUCOSE 121*  BUN 29*  CREATININE 1.11*  CALCIUM 8.5*   ------------------------------------------------------------------------------------------------------------------  Cardiac Enzymes Recent Labs  Lab 07/15/18 1342  TROPONINI <0.03   ------------------------------------------------------------------------------------------------------------------  RADIOLOGY:  No results found.  EKG:   Orders placed or performed during the hospital encounter of 07/15/18  . ED EKG  . ED EKG  . EKG 12-Lead  . EKG 12-Lead    ASSESSMENT AND PLAN:   85 year old female with past medical history significant for peripheral neuropathy causing falls, hypertension, GERD, diabetes, COPD and vertigo presenting from [facility] long-term care secondary to weakness and noted to have a hemoglobin drop on admission.  1.  Acute on chronic anemia-likely iron deficiency anemia given her melena and intake of NSAIDs twice a day for several years. -Appreciate GI consult.  Status post EGD showing clean antral ulcers in the stomach with no active bleeding. -Continue PPI at discharge.  Hemoglobin is stable after 1 unit transfusion at 8.2 -No active bleeding noted.  Patient also has  some occasional hemorrhoidal bleeding. -No NSAIDs -  According to family, patient did have a colonoscopy a few years ago that showed polyps and diverticulosis and hemorrhoids -Outpatient GI follow-up recommended.  2.  Severe peripheral neuropathy-which is the reason for patient's unsteady gait and falls and she is in a wheelchair at baseline. -Continue gabapentin and Cymbalta.  Discontinue Aleve and started tramadol  3.  Depression-continue Remeron, Lamictal  4.  Hypertension-metoprolol and Cozaar    5.  Diabetes mellitus- Glucophage.  6.  DVT prophylaxis-teds and SCDs   Awaiting insurance to go to rehab     All the records are reviewed and case discussed with Care Management/Social Workerr. Management plans discussed with the patient, family and they are in agreement.  CODE STATUS: DNR  TOTAL TIME TAKING CARE OF THIS PATIENT: 29 minutes.   POSSIBLE D/C IN 1-2 DAYS, DEPENDING ON CLINICAL CONDITION.   Gladstone Lighter M.D on 07/19/2018 at 3:32 PM  Between 7am to 6pm - Pager - [phone]  After 6pm go to [email or web address] - password [email or web address] [facility]  Office  [phone]  CC: Primary care physician; Juluis Pitch, MD

## 2018-07-19 NOTE — Discharge Summary (Addendum)
[facility] at [place] NAME: Chelsea [name]    MR#:  [id number]  DATE OF BIRTH:  1930/09/19  DATE OF ADMISSION:  07/15/2018   ADMITTING PHYSICIAN: Demetrios Loll, MD  DATE OF DISCHARGE:  07/22/2018  PRIMARY CARE PHYSICIAN: Juluis Pitch, MD   ADMISSION DIAGNOSIS:   Gastrointestinal hemorrhage, unspecified gastrointestinal hemorrhage type [K92.2]  DISCHARGE DIAGNOSIS:   Active Problems:   GIB (gastrointestinal bleeding)   Blood in stool   Acute peptic ulcer of stomach   SECONDARY DIAGNOSIS:   Past Medical History:  Diagnosis Date  . Anemia   . Chronic obstructive asthma ([place])   . COPD (chronic obstructive pulmonary disease) ([place])   . Depression   . Diabetes mellitus without complication ([place])   . Femoral bruit   . GERD without esophagitis   . Hypertension   . Hypertension   . Lumbago   . Neuropathy   . Osteoarthritis   . Psoriasis   . Tremor   . Ulcerative colitis ([place])   . Vertigo     HOSPITAL COURSE:   85 year old female with past medical history significant for peripheral neuropathy causing falls, hypertension, GERD, diabetes, COPD and vertigo presenting from [facility] long-term care secondary to weakness and noted to have a hemoglobin drop on admission.  1.  Acute on chronic anemia-likely iron deficiency anemia given her melena and intake of NSAIDs twice a day for several years. -Appreciate GI consult.  Status post EGD showing clean antral ulcers in the stomach with no active bleeding. -Continue PPI at discharge.  Hemoglobin is stable after 1 unit transfusion at 8.2 -No active bleeding noted.  Patient also has some occasional hemorrhoidal bleeding. -No NSAIDs -According to family, patient did have a colonoscopy a few years ago that showed polyps and diverticulosis and hemorrhoids -Outpatient GI follow-up recommended.  2.  Severe peripheral neuropathy-which is the reason for patient's unsteady gait and falls and  she is in a wheelchair at baseline. -Continue gabapentin and Cymbalta.  Discontinued Aleve and started tramadol  3.  Depression-continue Remeron, Lamictal  4.  Hypertension-metoprolol and Cozaar    5.  Diabetes mellitus-on metformin and diet controlled.   Patient to go to [facility].  Awaiting insurance authorization  DISCHARGE CONDITIONS:   Guarded  CONSULTS OBTAINED:   None  DRUG ALLERGIES:   Allergies  Allergen Reactions  . Lyrica [Pregabalin] Swelling   DISCHARGE MEDICATIONS:   Allergies as of 07/22/2018      Reactions   Lyrica [pregabalin] Swelling      Medication List    STOP taking these medications   naproxen 500 MG tablet Commonly known as:  NAPROSYN     TAKE these medications   acetaminophen 325 MG tablet Commonly known as:  TYLENOL Take 650 mg by mouth every 4 (four) hours as needed for mild pain or fever.   donepezil 10 MG tablet Commonly known as:  ARICEPT Take 10 mg by mouth at bedtime.   DULoxetine 60 MG capsule Commonly known as:  CYMBALTA Take 60 mg by mouth at bedtime.   ferrous sulfate 325 (65 FE) MG EC tablet Take 1 tablet (325 mg total) by mouth 2 (two) times daily with a meal for 30 days.   Fluticasone-Salmeterol 250-50 MCG/DOSE Aepb Commonly known as:  ADVAIR Inhale 2 puffs into the lungs 2 (two) times daily.   guaifenesin 100 MG/5ML syrup Commonly known as:  ROBITUSSIN Take 200 mg by mouth every 6 (six) hours as needed  for cough.   lamoTRIgine 25 MG tablet Commonly known as:  LAMICTAL Take 25 mg by mouth 2 (two) times daily.   loratadine 10 MG tablet Commonly known as:  CLARITIN Take 10 mg by mouth daily.   losartan 50 MG tablet Commonly known as:  COZAAR Take 1 tablet (50 mg total) by mouth daily. What changed:    medication strength  how much to take   MAG-AL PLUS XS 400-400-40 MG/5ML suspension Generic drug:  alum & mag hydroxide-simeth Take 15 mLs by mouth every 6 (six) hours as needed for  indigestion.   magnesium hydroxide 400 MG/5ML suspension Commonly known as:  MILK OF MAGNESIA Take 30 mLs by mouth daily as needed for mild constipation. What changed:  when to take this   Melatonin 3 MG Tabs Take 1 tablet by mouth at bedtime.   metFORMIN 500 MG 24 hr tablet Commonly known as:  GLUCOPHAGE-XR Take 500 mg by mouth daily with breakfast.   metoprolol tartrate 25 MG tablet Commonly known as:  LOPRESSOR Take 25 mg by mouth 2 (two) times daily.   mirtazapine 7.5 MG tablet Commonly known as:  REMERON Take 7.5 mg by mouth at bedtime.   NON FORMULARY Diet Type: Regular   OXYGEN Inhale 2 L/min into the lungs at bedtime.   traMADol 50 MG tablet Commonly known as:  ULTRAM Take 1 tablet (50 mg total) by mouth every 6 (six) hours as needed for moderate pain or severe pain.     ASK your doctor about these medications   PREPARATION H 0.25-14-74.9 % rectal ointment Generic drug:  phenylephrine-shark liver oil-mineral oil-petrolatum Place 1 application rectally every 12 (twelve) hours. Ask about: Should I take this medication?      Neurontin 100 mg PO TWICE DAILY  Protonix 40 mg PO TWICE DAILY  DISCHARGE INSTRUCTIONS:   1.  PCP follow-up in 1 to 2 weeks 2.  GI follow-up in 2 to 3 weeks  DIET:   Cardiac diet  ACTIVITY:   Activity as tolerated  OXYGEN:   Home Oxygen: No.  Oxygen Delivery: room air  DISCHARGE LOCATION:   nursing home   If you experience worsening of your admission symptoms, develop shortness of breath, life threatening emergency, suicidal or homicidal thoughts you must seek medical attention immediately by calling 911 or calling your MD immediately  if symptoms less severe.  You Must read complete instructions/literature along with all the possible adverse reactions/side effects for all the Medicines you take and that have been prescribed to you. Take any new Medicines after you have completely understood and accpet all the possible  adverse reactions/side effects.   Please note  You were cared for by a hospitalist during your hospital stay. If you have any questions about your discharge medications or the care you received while you were in the hospital after you are discharged, you can call the unit and asked to speak with the hospitalist on call if the hospitalist that took care of you is not available. Once you are discharged, your primary care physician will handle any further medical issues. Please note that NO REFILLS for any discharge medications will be authorized once you are discharged, as it is imperative that you return to your primary care physician (or establish a relationship with a primary care physician if you do not have one) for your aftercare needs so that they can reassess your need for medications and monitor your lab values.    On the day of Discharge:  VITAL SIGNS:   Blood pressure 122/71, pulse 87, temperature 98.3 F (36.8 C), temperature source Oral, resp. rate 18, height 5' 2"  (1.575 m), weight 65.8 kg, SpO2 96 %.  PHYSICAL EXAMINATION:   GENERAL:  85 y.o.-year-old elderly patient lying in the bed with no acute distress.  EYES: Pupils equal, round, reactive to light and accommodation. No scleral icterus. Extraocular muscles intact.  HEENT: Head atraumatic, normocephalic. Oropharynx and nasopharynx clear.  NECK:  Supple, no jugular venous distention. No thyroid enlargement, no tenderness.  LUNGS: Normal breath sounds bilaterally, no wheezing, rales,rhonchi or crepitation. No use of accessory muscles of respiration.  Decreased bibasilar breath sounds CARDIOVASCULAR: S1, S2 normal. No  rubs, or gallops.  2/6 systolic murmur is present ABDOMEN: Soft, nontender, nondistended. Bowel sounds present. No organomegaly or mass.  EXTREMITIES: No pedal edema, cyanosis, or clubbing.  NEUROLOGIC: Cranial nerves II through XII are intact. Muscle strength 5/5 in both upper extremities and 4/5 in both lower  extremities. Sensation intact. Gait not checked.  PSYCHIATRIC: The patient is alert and oriented x 3.  SKIN: No obvious rash, lesion, or ulcer.    DATA REVIEW:   CBC Recent Labs  Lab 07/18/18 0520  WBC 8.1  HGB 8.4*  HCT 26.4*  PLT 340    Chemistries  Recent Labs  Lab 07/16/18 0416  NA 135  K 4.2  CL 103  CO2 27  GLUCOSE 121*  BUN 29*  CREATININE 1.11*  CALCIUM 8.5*     Microbiology Results  Results for orders placed or performed during the hospital encounter of 02/19/18  Urine Culture     Status: None   Collection Time: 02/20/18 12:17 AM  Result Value Ref Range Status   Specimen Description   Final    URINE, RANDOM Performed at [facility], [address] [place], [place] [address]    Special Requests   Final    NONE Performed at [facility], [address] [place], [place] [address]    Culture   Final    NO GROWTH Performed at [facility], [place] [address] [place], [place] [address]    Report Status 02/21/2018 FINAL  Final    RADIOLOGY:  No results found.   Management plans discussed with the patient, family and they are in agreement.  CODE STATUS:     Code Status Orders  (From admission, onward)         Start     Ordered   07/15/18 1946  Do not attempt resuscitation (DNR)  Continuous    Question Answer Comment  In the event of cardiac or respiratory ARREST Do not call a "code blue"   In the event of cardiac or respiratory ARREST Do not perform Intubation, CPR, defibrillation or ACLS   In the event of cardiac or respiratory ARREST Use medication by any route, position, wound care, and other measures to relive pain and suffering. May use oxygen, suction and manual treatment of airway obstruction as needed for comfort.      07/15/18 1945        Code Status History    Date Active Date Inactive Code Status Order ID Comments User Context   05/22/2016 0048 05/23/2016 1919 DNR [id number]  Lance Coon, MD Inpatient    02/06/2015 2155 02/07/2015 2029 DNR [id number]  Bettey Costa, MD Inpatient    Advance Directive Documentation     Most Recent Value  Type of Advance Directive  Healthcare Power of [place], Living will, Out  of facility DNR (pink MOST or yellow form)  Pre-existing out of facility DNR order (yellow form or pink MOST form)  Yellow form placed in chart (order not valid for inpatient use), Physician notified to receive inpatient order  "MOST" Form in Place?  -      TOTAL TIME TAKING CARE OF THIS PATIENT: 38 minutes.    Leia Alf Keerat Denicola M.D on 07/22/2018 at 11:14 AM  Between 7am to 6pm - Pager - [phone]  After 6pm go to [email or web address] - Proofreader  [facility]  Office  [phone]  CC: Primary care physician; Juluis Pitch, MD   Note: This dictation was prepared with Dragon dictation along with smaller phrase technology. Any transcriptional errors that result from this process are unintentional.

## 2018-07-20 LAB — GLUCOSE, CAPILLARY
Glucose-Capillary: 123 mg/dL — ABNORMAL HIGH (ref 70–99)
Glucose-Capillary: 140 mg/dL — ABNORMAL HIGH (ref 70–99)
Glucose-Capillary: 154 mg/dL — ABNORMAL HIGH (ref 70–99)
Glucose-Capillary: 91 mg/dL (ref 70–99)

## 2018-07-20 MED ORDER — POLYETHYLENE GLYCOL 3350 17 G PO PACK
17.0000 g | PACK | Freq: Two times a day (BID) | ORAL | Status: DC
  Administered 2018-07-20 – 2018-07-21 (×2): 17 g via ORAL
  Filled 2018-07-20 (×3): qty 1

## 2018-07-20 NOTE — Progress Notes (Signed)
[facility] at [place] NAME: Chelsea [name]    MR#:  [id number]  DATE OF BIRTH:  1931-03-02  SUBJECTIVE:  CHIEF COMPLAINT:   Chief Complaint  Patient presents with  . Weakness   Constipated.  No abdominal pain or vomiting.  REVIEW OF SYSTEMS:  Review of Systems  Constitutional: Positive for malaise/fatigue. Negative for chills and fever.  HENT: Negative for congestion, ear discharge, hearing loss and nosebleeds.   Eyes: Negative for blurred vision and double vision.  Respiratory: Negative for cough, shortness of breath and wheezing.   Cardiovascular: Negative for chest pain and palpitations.  Gastrointestinal: Positive for melena. Negative for abdominal pain, constipation, diarrhea, nausea and vomiting.  Genitourinary: Negative for dysuria.  Musculoskeletal: Positive for joint pain and myalgias.  Neurological: Negative for dizziness, focal weakness, seizures, weakness and headaches.  Psychiatric/Behavioral: Negative for depression.    DRUG ALLERGIES:   Allergies  Allergen Reactions  . Lyrica [Pregabalin] Swelling    VITALS:  Blood pressure (!) 137/54, pulse 75, temperature 98 F (36.7 C), temperature source Oral, resp. rate 20, height 5' 2"  (1.575 m), weight 65.8 kg, SpO2 96 %.  PHYSICAL EXAMINATION:  Physical Exam   GENERAL:  85 y.o.-year-old elderly patient lying in the bed with no acute distress.  EYES: Pupils equal, round, reactive to light and accommodation. No scleral icterus. Extraocular muscles intact.  HEENT: Head atraumatic, normocephalic. Oropharynx and nasopharynx clear.  NECK:  Supple, no jugular venous distention. No thyroid enlargement, no tenderness.  LUNGS: Normal breath sounds bilaterally, no wheezing, rales,rhonchi or crepitation. No use of accessory muscles of respiration.  Decreased bibasilar breath sounds CARDIOVASCULAR: S1, S2 normal. No  rubs, or gallops.  2/6 systolic murmur is present ABDOMEN: Soft,  nontender, nondistended. Bowel sounds present. No organomegaly or mass.  EXTREMITIES: No pedal edema, cyanosis, or clubbing.  NEUROLOGIC: Cranial nerves II through XII are intact. Muscle strength 5/5 in both upper extremities and 4/5 in both lower extremities. Sensation intact. Gait not checked.  PSYCHIATRIC: The patient is alert and oriented x 3.  SKIN: No obvious rash, lesion, or ulcer.    LABORATORY PANEL:   CBC Recent Labs  Lab 07/18/18 0520  WBC 8.1  HGB 8.4*  HCT 26.4*  PLT 340   ------------------------------------------------------------------------------------------------------------------  Chemistries  Recent Labs  Lab 07/16/18 0416  NA 135  K 4.2  CL 103  CO2 27  GLUCOSE 121*  BUN 29*  CREATININE 1.11*  CALCIUM 8.5*   ------------------------------------------------------------------------------------------------------------------  Cardiac Enzymes Recent Labs  Lab 07/15/18 1342  TROPONINI <0.03   ------------------------------------------------------------------------------------------------------------------  RADIOLOGY:  No results found.  EKG:   Orders placed or performed during the hospital encounter of 07/15/18  . ED EKG  . ED EKG  . EKG 12-Lead  . EKG 12-Lead    ASSESSMENT AND PLAN:   85 year old female with past medical history significant for peripheral neuropathy causing falls, hypertension, GERD, diabetes, COPD and vertigo presenting from [facility] long-term care secondary to weakness and noted to have a hemoglobin drop on admission.  1.  Acute on chronic anemia-likely iron deficiency anemia given her melena and intake of NSAIDs twice a day for several years.  Status post EGD showing clean antral ulcers in the stomach with no active bleeding. -Continue PPI at discharge.  Hemoglobin is stable after 1 unit transfusion at 8.2 -No active bleeding noted.  Patient also has some occasional hemorrhoidal bleeding. We will add laxatives to avoid  constipation -No NSAIDs -According to  family, patient did have a colonoscopy a few years ago that showed polyps and diverticulosis and hemorrhoids -Outpatient GI follow-up recommended.  2.  Severe peripheral neuropathy-which is the reason for patient's unsteady gait and falls and she is in a wheelchair at baseline. -Continue gabapentin and Cymbalta.  Discontinued Aleve and started tramadol  3.  Depression-continue Remeron, Lamictal  4.  Hypertension-metoprolol and Cozaar    5.  Diabetes mellitus- Glucophage.  6.  DVT prophylaxis-teds and SCDs.  No heparin or Lovenox due to GI bleed  Likely discharge on Monday.   All the records are reviewed and case discussed with Care Management/Social Workerr. Management plans discussed with the patient, family and they are in agreement.  CODE STATUS: DNR  TOTAL TIME TAKING CARE OF THIS PATIENT: 35 minutes.   POSSIBLE D/C IN 1-2 DAYS, DEPENDING ON CLINICAL CONDITION.  Leia Alf Sheldon Amara M.D on 07/20/2018 at 11:17 AM  Between 7am to 6pm - Pager - [phone]  After 6pm go to [email or web address] - password [email or web address] [place]  Office  [phone]  CC: Primary care physician; Juluis Pitch, MD

## 2018-07-20 NOTE — Plan of Care (Signed)
  Problem: Coping: Goal: Level of anxiety will decrease Outcome: Progressing   Problem: Elimination: Goal: Will not experience complications related to bowel motility Outcome: Progressing

## 2018-07-21 LAB — GLUCOSE, CAPILLARY
GLUCOSE-CAPILLARY: 94 mg/dL (ref 70–99)
Glucose-Capillary: 98 mg/dL (ref 70–99)
Glucose-Capillary: 135 mg/dL — ABNORMAL HIGH (ref 70–99)
Glucose-Capillary: 119 mg/dL — ABNORMAL HIGH (ref 70–99)

## 2018-07-21 MED ORDER — HYDROCORTISONE 2.5 % RE CREA
TOPICAL_CREAM | Freq: Three times a day (TID) | RECTAL | Status: DC
  Filled 2018-07-21: qty 28.35

## 2018-07-21 NOTE — Progress Notes (Signed)
[facility] at [place] NAME: Chelsea [name]    MR#:  [id number]  DATE OF BIRTH:  1930-07-04  SUBJECTIVE:  CHIEF COMPLAINT:   Chief Complaint  Patient presents with  . Weakness   Had large BM. No abd pain today. Feels better  REVIEW OF SYSTEMS:  Review of Systems  Constitutional: Positive for malaise/fatigue. Negative for chills and fever.  HENT: Negative for congestion, ear discharge, hearing loss and nosebleeds.   Eyes: Negative for blurred vision and double vision.  Respiratory: Negative for cough, shortness of breath and wheezing.   Cardiovascular: Negative for chest pain and palpitations.  Gastrointestinal: Positive for melena. Negative for abdominal pain, constipation, diarrhea, nausea and vomiting.  Genitourinary: Negative for dysuria.  Musculoskeletal: Positive for joint pain and myalgias.  Neurological: Negative for dizziness, focal weakness, seizures, weakness and headaches.  Psychiatric/Behavioral: Negative for depression.    DRUG ALLERGIES:   Allergies  Allergen Reactions  . Lyrica [Pregabalin] Swelling    VITALS:  Blood pressure (!) 153/58, pulse 71, temperature 97.9 F (36.6 C), temperature source Oral, resp. rate 20, height 5' 2"  (1.575 m), weight 65.8 kg, SpO2 94 %.  PHYSICAL EXAMINATION:  Physical Exam   GENERAL:  85 y.o.-year-old elderly patient lying in the bed with no acute distress.  EYES: Pupils equal, round, reactive to light and accommodation. No scleral icterus. Extraocular muscles intact.  HEENT: Head atraumatic, normocephalic. Oropharynx and nasopharynx clear.  NECK:  Supple, no jugular venous distention. No thyroid enlargement, no tenderness.  LUNGS: Normal breath sounds bilaterally, no wheezing, rales,rhonchi or crepitation. No use of accessory muscles of respiration.  Decreased bibasilar breath sounds CARDIOVASCULAR: S1, S2 normal. No  rubs, or gallops.  2/6 systolic murmur is present ABDOMEN:  Soft, nontender, nondistended. Bowel sounds present. No organomegaly or mass.  EXTREMITIES: No pedal edema, cyanosis, or clubbing.  NEUROLOGIC: Cranial nerves II through XII are intact. Muscle strength 5/5 in both upper extremities and 4/5 in both lower extremities. Sensation intact. Gait not checked.  PSYCHIATRIC: The patient is alert and oriented x 3.  SKIN: No obvious rash, lesion, or ulcer.    LABORATORY PANEL:   CBC Recent Labs  Lab 07/18/18 0520  WBC 8.1  HGB 8.4*  HCT 26.4*  PLT 340   ------------------------------------------------------------------------------------------------------------------  Chemistries  Recent Labs  Lab 07/16/18 0416  NA 135  K 4.2  CL 103  CO2 27  GLUCOSE 121*  BUN 29*  CREATININE 1.11*  CALCIUM 8.5*   ------------------------------------------------------------------------------------------------------------------  Cardiac Enzymes Recent Labs  Lab 07/15/18 1342  TROPONINI <0.03   ------------------------------------------------------------------------------------------------------------------  RADIOLOGY:  No results found.  EKG:   Orders placed or performed during the hospital encounter of 07/15/18  . ED EKG  . ED EKG  . EKG 12-Lead  . EKG 12-Lead    ASSESSMENT AND PLAN:   85 year old female with past medical history significant for peripheral neuropathy causing falls, hypertension, GERD, diabetes, COPD and vertigo presenting from [facility] long-term care secondary to weakness and noted to have a hemoglobin drop on admission.  *  Acute on chronic anemia-likely iron deficiency anemia given her melena and intake of NSAIDs twice a day for several years.  Status post EGD showing clean antral ulcers in the stomach with no active bleeding. -Continue PPI at discharge.  Hemoglobin is stable after 1 unit transfusion at 8.2 -No active bleeding noted.  Patient also has some occasional hemorrhoidal bleeding. We will add laxatives to  avoid constipation -No NSAIDs -  According to family, patient did have a colonoscopy a few years ago that showed polyps and diverticulosis and hemorrhoids -Outpatient GI follow-up recommended.  * Severe peripheral neuropathy-which is the reason for patient's unsteady gait and falls and she is in a wheelchair at baseline. -Continue gabapentin and Cymbalta.  Discontinued Aleve and started tramadol  *  Depression-continue Remeron, Lamictal  *  Hypertension-metoprolol and Cozaar    *  Diabetes mellitus- Glucophage.  *  DVT prophylaxis-teds and SCDs.   No heparin or Lovenox due to GI bleed  Likely discharge on Monday.   All the records are reviewed and case discussed with Care Management/Social Workerr. Management plans discussed with the patient, family and they are in agreement.  CODE STATUS: DNR  TOTAL TIME TAKING CARE OF THIS PATIENT: 25 minutes.   POSSIBLE D/C IN 1-2 DAYS, DEPENDING ON CLINICAL CONDITION.  Leia Alf [name] M.D on 07/21/2018 at 10:26 AM  Between 7am to 6pm - Pager - [phone]  After 6pm go to [email or web address] - password [email or web address] [place]  Office  [phone]  CC: Primary care physician; Juluis Pitch, MD

## 2018-07-21 NOTE — Progress Notes (Signed)
No sign/symptom bleeding. Constipation resolved. Hemorrhoid discomfort resolved. Up in chair and tolerated well. Awaiting SNF bed placement.

## 2018-07-21 NOTE — Plan of Care (Signed)

## 2018-07-21 NOTE — Progress Notes (Signed)
Advance care planning  Purpose of Encounter GI bleed, code status discussion  Parties in Attendance Patient  Patients Decisional capacity Alert and oriented.  Documented HCPOA is her daughter  Discussed with patient regarding her GI bleed, treatment and prognosis.  Waiting on insurance authorization for discharge.  Patient has DNR band.  Patient tells me that she was not as this question.  She was not aware that she had DNR status in place.  We discussed regarding CODE STATUS, intubation, CPR.  Patient asked appropriate questions. She wishes to be DNR/DNI after the discussion.  DNR/DNI  Time spent - 17 minutes

## 2018-07-22 LAB — GLUCOSE, CAPILLARY
GLUCOSE-CAPILLARY: 130 mg/dL — AB (ref 70–99)
Glucose-Capillary: 118 mg/dL — ABNORMAL HIGH (ref 70–99)
Glucose-Capillary: 155 mg/dL — ABNORMAL HIGH (ref 70–99)

## 2018-07-22 MED ORDER — FERROUS SULFATE 325 (65 FE) MG PO TBEC: 325.0000 mg | DELAYED_RELEASE_TABLET | Freq: Two times a day (BID) | ORAL | 0 refills | Status: DC

## 2018-07-22 NOTE — Care Management Important Message (Signed)
Important Message  Patient Details  Name: Chelsea [name] MRN: [id number] Date of Birth: [birth year]   Medicare Important Message Given:  Yes    [name] 07/22/2018, 11:39 AM

## 2018-07-22 NOTE — Progress Notes (Signed)
Report called to Pet[place], RN @ [facility].  EMS contacted for transport.  Clarise Cruz, BSN

## 2018-07-22 NOTE — Clinical Social Work Note (Signed)
Patient is medically ready for discharge today. CSW notified Otila Kluver at [facility] [employer]. Otila Kluver states that they have received authorization for patient. CSW also notified patient's daughter Bristyn Kulesza [phone] of discharge today. Patient will be transported by EMS. RN to call report and call for transport.   [place], [place]

## 2018-07-22 NOTE — Progress Notes (Signed)
Patient transported via EMS to [facility].  Patient's cell phone placed in bag and glasses on top of her head.  Sent clothing with patient. Patient daughter Wenda Low called when EMS leaving with patient. Clarise Cruz, BSN

## 2018-07-22 NOTE — Plan of Care (Signed)

## 2018-11-02 IMAGING — DX DG KNEE 1-2V*R*
2 series · 2 of 2 positions shown · non-contrast
Comparison: None.

CLINICAL DATA: Bilateral leg and foot pain. Difficulty ambulating.
Bruising anterior to the right knee.

EXAM:
RIGHT KNEE - 1-2 VIEW

[knee ap]
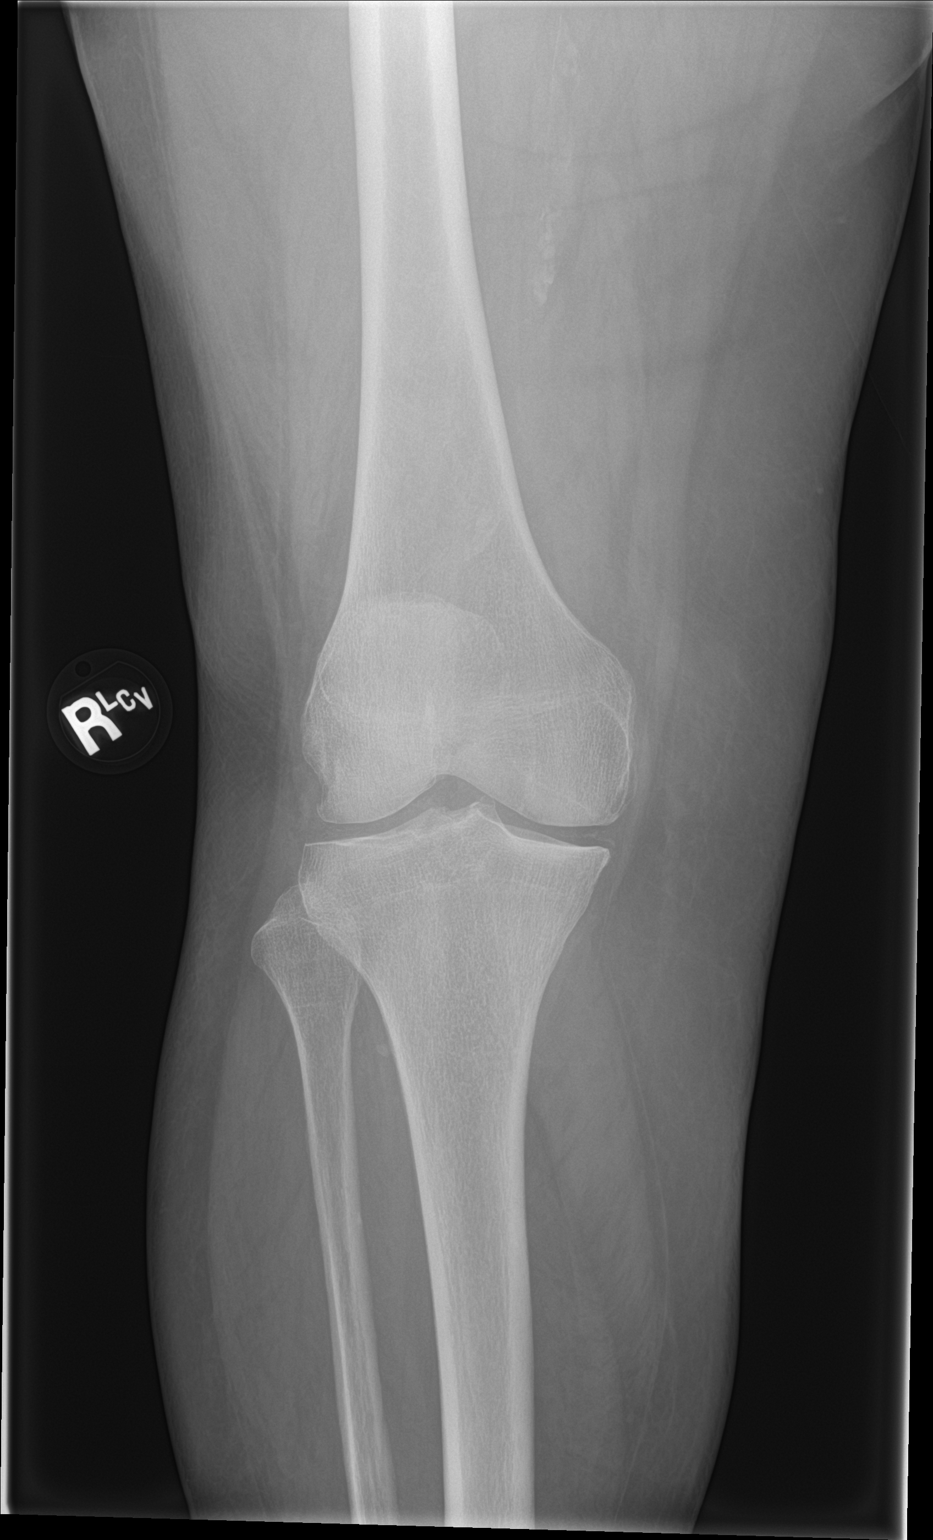

[knee lat]
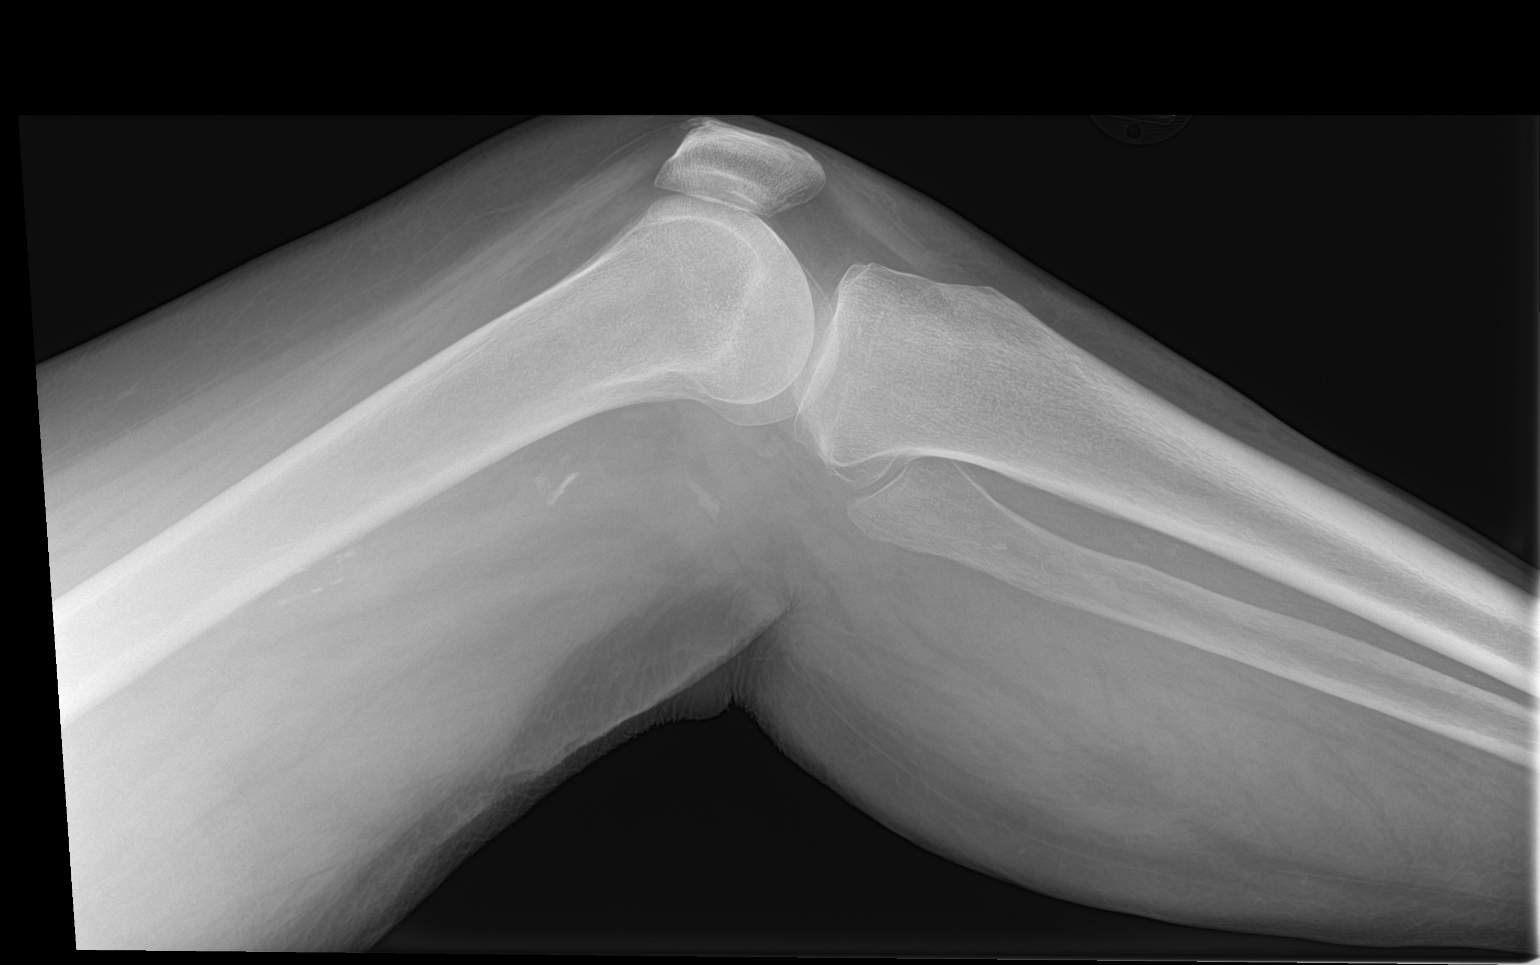

[2 of 2 positions shown; findings below may reference images not displayed]

FINDINGS: Chondrocalcinosis. No fracture or dislocation. No significant
degenerative changes. No joint effusion.
IMPRESSION: Chondrocalcinosis consistent with CPPD. No other acute
abnormalities.

## 2018-11-02 IMAGING — MR MR LUMBAR SPINE W/O CM
5 series · 36 of 48 positions shown · non-contrast
Comparison: None available.

CLINICAL DATA: Initial evaluation for bilateral leg and foot pain
with increased difficulty with ambulation.

EXAM:
MRI LUMBAR SPINE WITHOUT CONTRAST
TECHNIQUE: Multiplanar, multisequence MR imaging of the lumbar spine was
performed. No intravenous contrast was administered.

[Series 2: T2 · sagittal · 4.0mm · 0.81mm/px · 6 of 17 slices shown (1 of 2)]
[im 1/17]
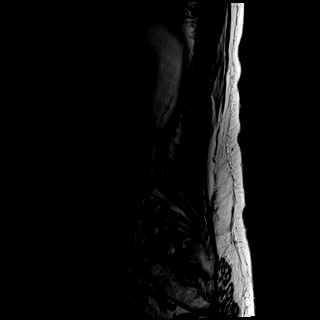
[im 4/17]
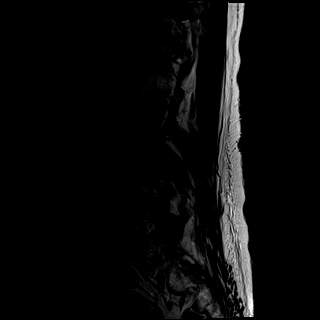
[im 7/17]
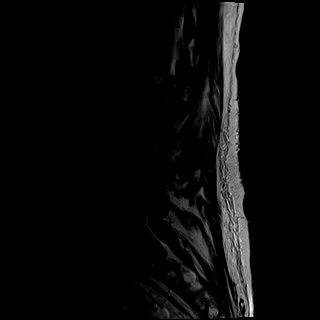
[im 10/17]
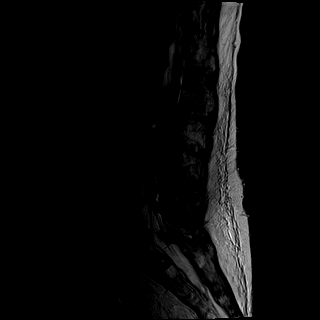
[im 13/17]
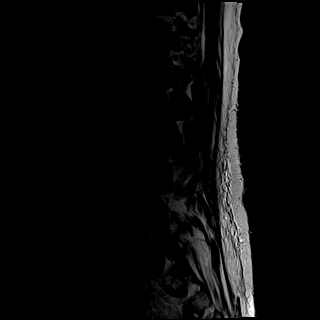
[im 17/17]
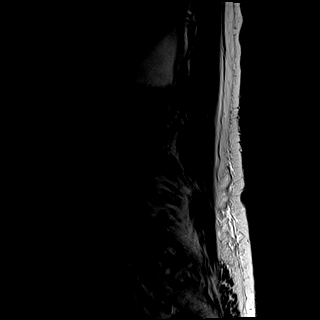

[Series 3: T1 · sagittal · 4.0mm · 0.81mm/px · 7 of 17 slices shown (1 of 2)]
[im 1/17]
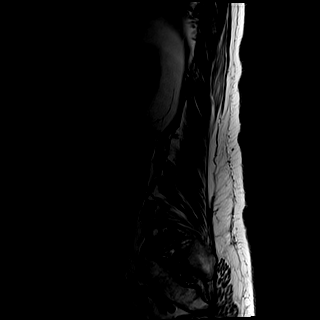
[im 3/17]
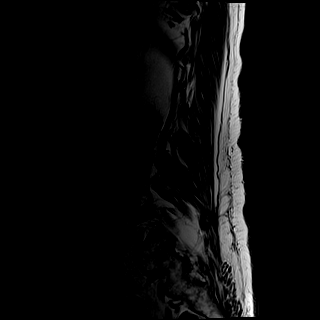
[im 6/17]
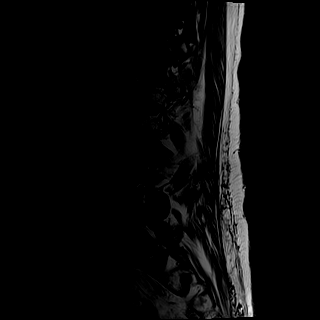
[im 9/17]
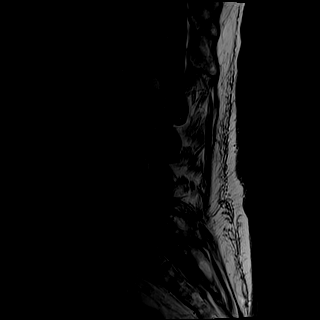
[im 11/17]
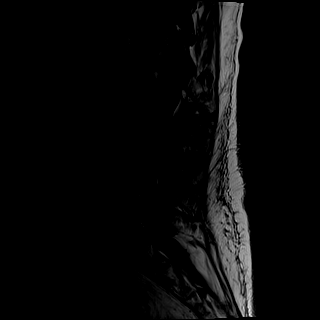
[im 14/17]
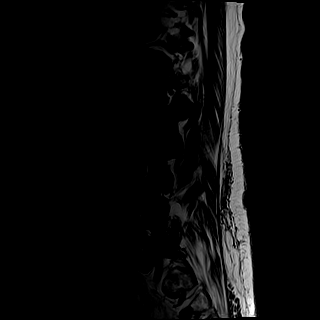
[im 17/17]
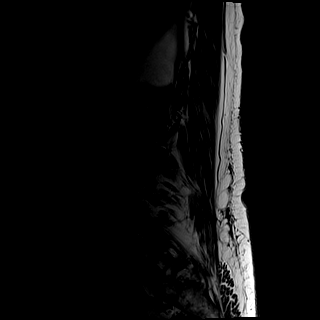

[Series 4: STIR · sagittal · 4.0mm · 1.02mm/px · 7 of 17 slices shown]
[im 1/17]
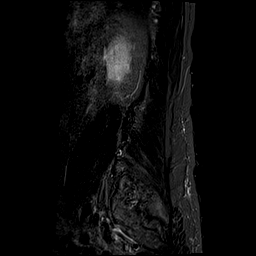
[im 3/17]
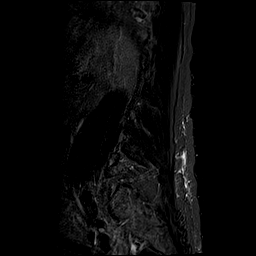
[im 6/17]
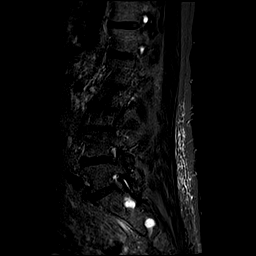
[im 9/17]
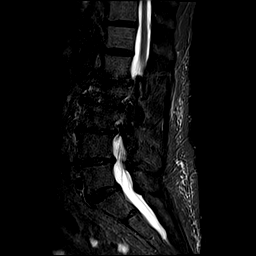
[im 11/17]
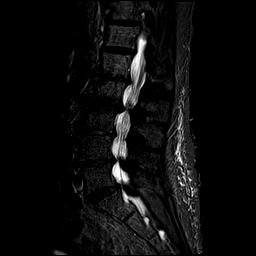
[im 14/17]
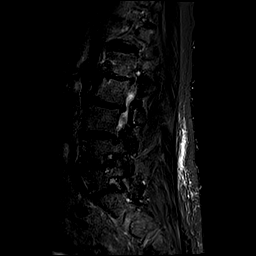
[im 17/17]
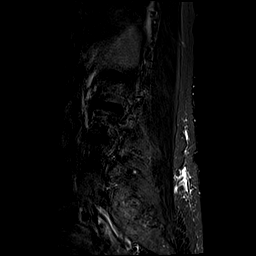

[Series 5: T2 · axial · 4.0mm · 0.78mm/px · z∈[-105,+84]mm · 8 of 35 slices shown (2 of 2)]
[im 1/35]
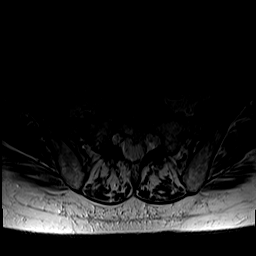
[im 6/35]
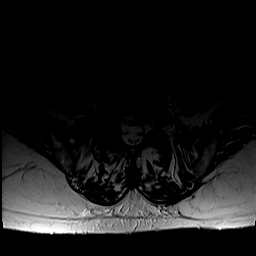
[im 11/35]
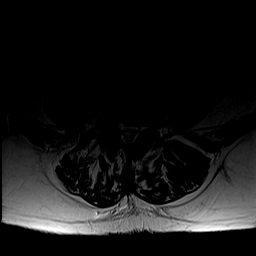
[im 16/35]
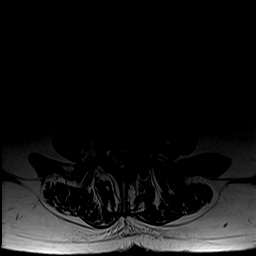
[im 19/35]
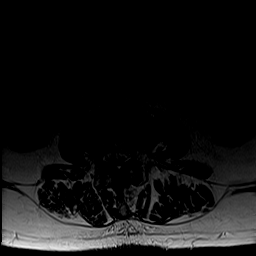
[im 24/35]
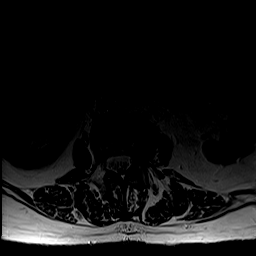
[im 29/35]
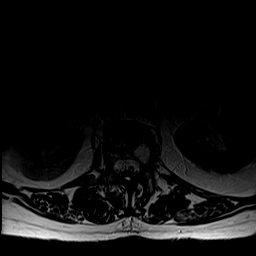
[im 35/35]
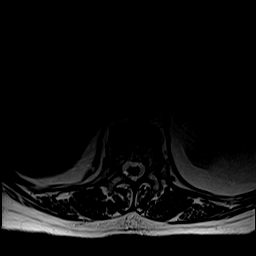

[Series 6: T1 · axial · 4.0mm · 0.39mm/px · z∈[-105,+84]mm · 8 of 35 slices shown (2 of 2)]
[im 1/35]
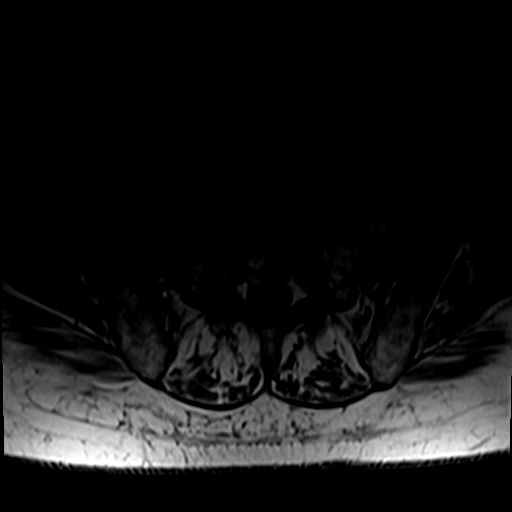
[im 6/35]
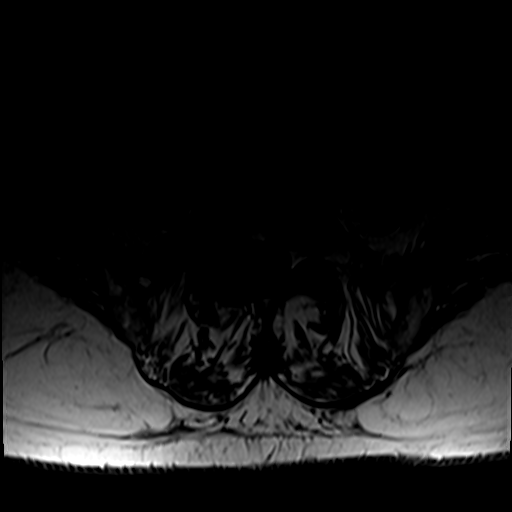
[im 11/35]
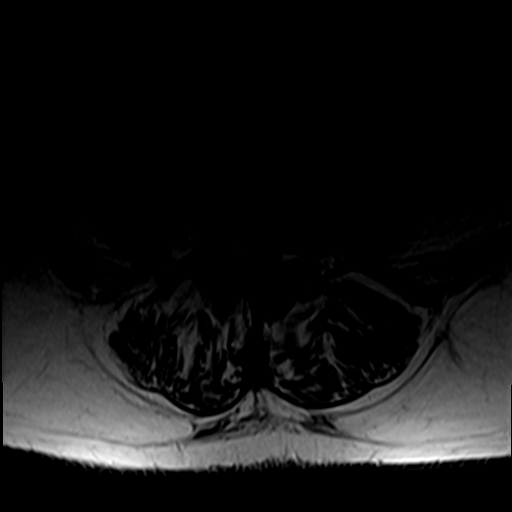
[im 16/35]
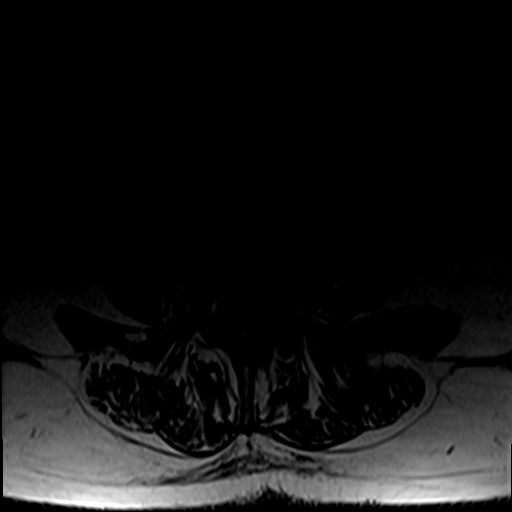
[im 19/35]
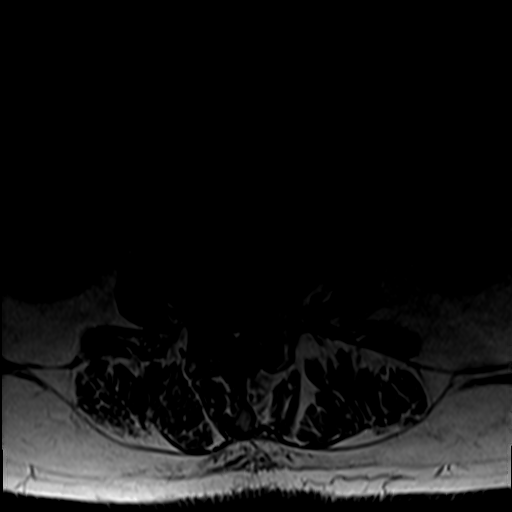
[im 24/35]
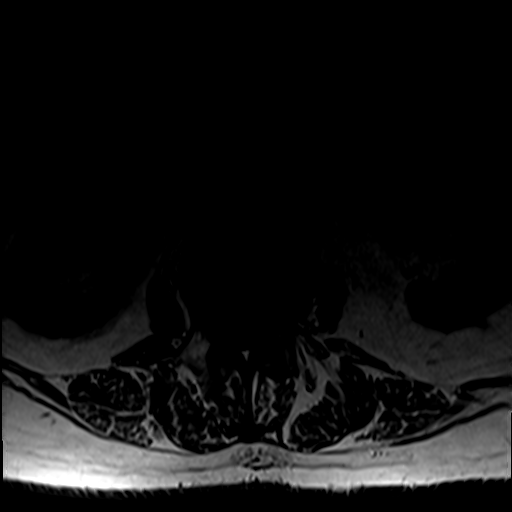
[im 29/35]
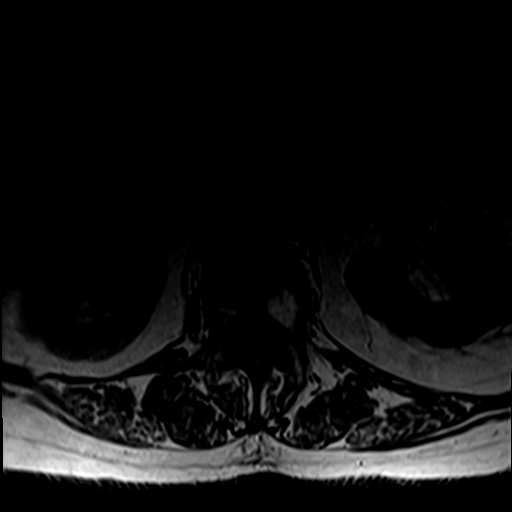
[im 35/35]
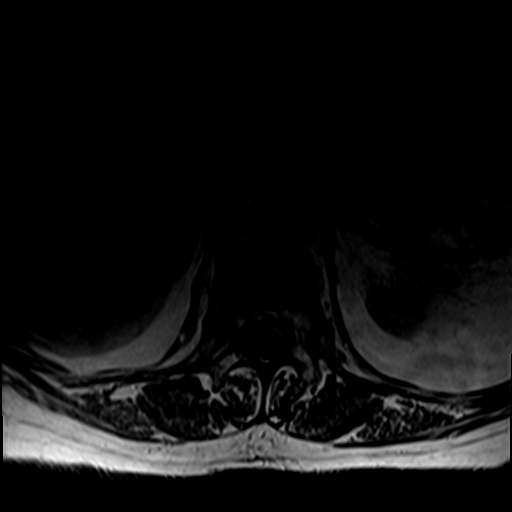

[36 of 48 positions shown; findings below may reference images not displayed]

FINDINGS: Segmentation: Normal segmentation. Lowest well-formed disc labeled
the L5-S1 level.

Alignment: Right convex scoliosis with apex at L2-3. Trace
retrolisthesis of L1 on L2, with trace anterolisthesis of L5 on S1.

Vertebrae: Vertebral body heights maintained. No evidence for acute
or chronic fracture. Bone marrow signal intensity within normal
limits. Benign hemangioma noted within the L1 vertebral body. No
worrisome osseous lesions. Prominent reactive endplate changes about
the L1-2 and L2-3 interspaces related to scoliotic curvature.

Conus medullaris: Extends to the L2 level and appears normal.

Paraspinal and other soft tissues: Paraspinous soft tissues within
normal limits. Visualized visceral structures within normal limits.

Disc levels:

L1-2: Diffuse disc bulge with chronic intervertebral disc space
narrowing. Chronic reactive endplate changes with marginal endplate
osteophytic spurring, greatest on the left. Mild facet and
ligamentum flavum hypertrophy. Mild left lateral recess and
foraminal stenosis. No impingement.

L2-3: Chronic intervertebral disc space narrowing with diffuse disc
bulge and disc desiccation. Chronic reactive endplate changes,
greater on the left. Moderate facet and flavum hypertrophy.
Resultant moderate canal with moderate to severe lateral recess
stenosis, worse on the left. Mild-to-moderate left L2 foraminal
stenosis.

L3-4: Chronic intervertebral disc space narrowing with diffuse disc
bulge and disc desiccation. Left far lateral reactive endplate
changes. Moderate facet and ligamentum flavum hypertrophy. Mild to
moderate canal with moderate left subarticular stenosis. Mild to
moderate bilateral L3 foraminal narrowing.

L4-5: Right eccentric disc bulge with intervertebral disc space
narrowing. Chronic reactive endplate changes on the right. Moderate
right greater than left facet arthrosis. Resultant mild canal with
moderate right greater than left subarticular stenosis. Moderate
right L4 foraminal narrowing.

L5-S1: Trace anterolisthesis. Mild posterior disc bulge. Advanced
bilateral facet arthrosis. Mild lateral recess narrowing without
significant canal stenosis. Moderate right L5 foraminal stenosis
related disc bulge and facet arthropathy.
IMPRESSION: 1. No acute abnormality within the lumbar spine.
2. Right convex scoliosis with multilevel degenerative
spondylolysis, most prevalent at L1-2 thru L3-4. Resultant moderate
to severe canal and lateral recess stenosis at L2-3 and L3-4 as
above.
3. Multifactorial degenerative changes with resultant moderate right
L4 and L5 foraminal narrowing.

## 2019-05-30 ENCOUNTER — Emergency Department
Admission: EM | Admit: 2019-05-30 | Discharge: 2019-05-30 | Disposition: A | Discharge status: Home / Self Care | Payer: Medicare Other | Attending: Emergency Medicine | Admitting: Emergency Medicine

## 2019-05-30 ENCOUNTER — Encounter: Payer: Self-pay | Admitting: Emergency Medicine

## 2019-05-30 ENCOUNTER — Other Ambulatory Visit: Payer: Self-pay

## 2019-05-30 ENCOUNTER — Emergency Department: Payer: Medicare Other

## 2019-05-30 DIAGNOSIS — E119 Type 2 diabetes mellitus without complications: Secondary | ICD-10-CM | POA: Diagnosis not present

## 2019-05-30 DIAGNOSIS — Y92122 Bedroom in nursing home as the place of occurrence of the external cause: Secondary | ICD-10-CM | POA: Diagnosis not present

## 2019-05-30 DIAGNOSIS — Y9389 Activity, other specified: Secondary | ICD-10-CM | POA: Diagnosis not present

## 2019-05-30 DIAGNOSIS — S0083XA Contusion of other part of head, initial encounter: Secondary | ICD-10-CM | POA: Insufficient documentation

## 2019-05-30 DIAGNOSIS — W1839XA Other fall on same level, initial encounter: Secondary | ICD-10-CM | POA: Insufficient documentation

## 2019-05-30 DIAGNOSIS — Z79899 Other long term (current) drug therapy: Secondary | ICD-10-CM | POA: Insufficient documentation

## 2019-05-30 DIAGNOSIS — Y999 Unspecified external cause status: Secondary | ICD-10-CM | POA: Diagnosis not present

## 2019-05-30 DIAGNOSIS — I10 Essential (primary) hypertension: Secondary | ICD-10-CM | POA: Insufficient documentation

## 2019-05-30 DIAGNOSIS — R296 Repeated falls: Secondary | ICD-10-CM | POA: Diagnosis not present

## 2019-05-30 DIAGNOSIS — Z87891 Personal history of nicotine dependence: Secondary | ICD-10-CM | POA: Diagnosis not present

## 2019-05-30 DIAGNOSIS — Z7984 Long term (current) use of oral hypoglycemic drugs: Secondary | ICD-10-CM | POA: Diagnosis not present

## 2019-05-30 DIAGNOSIS — J449 Chronic obstructive pulmonary disease, unspecified: Secondary | ICD-10-CM | POA: Insufficient documentation

## 2019-05-30 DIAGNOSIS — W19XXXA Unspecified fall, initial encounter: Secondary | ICD-10-CM

## 2019-05-30 DIAGNOSIS — N390 Urinary tract infection, site not specified: Secondary | ICD-10-CM | POA: Diagnosis not present

## 2019-05-30 LAB — URINALYSIS, COMPLETE (UACMP) WITH MICROSCOPIC
Bilirubin Urine: NEGATIVE
Glucose, UA: NEGATIVE mg/dL
Hgb urine dipstick: NEGATIVE
Ketones, ur: NEGATIVE mg/dL
Nitrite: NEGATIVE
Protein, ur: 30 mg/dL — AB
Specific Gravity, Urine: 1.015 (ref 1.005–1.030)
Squamous Epithelial / HPF: NONE SEEN (ref 0–5)
WBC, UA: >50 WBC/hpf — ABNORMAL HIGH (ref 0–5)
pH: 6 (ref 5.0–8.0)

## 2019-05-30 LAB — COMPREHENSIVE METABOLIC PANEL
ALT: 13 U/L (ref 0–44)
AST: 21 U/L (ref 15–41)
Albumin: 2.5 g/dL — ABNORMAL LOW (ref 3.5–5.0)
Alkaline Phosphatase: 113 U/L (ref 38–126)
Anion gap: 8 (ref 5–15)
BUN: 33 mg/dL — ABNORMAL HIGH (ref 8–23)
CO2: 29 mmol/L (ref 22–32)
Calcium: 10.2 mg/dL (ref 8.9–10.3)
Chloride: 96 mmol/L — ABNORMAL LOW (ref 98–111)
Creatinine, Ser: 1.19 mg/dL — ABNORMAL HIGH (ref 0.44–1.00)
GFR calc Af Amer: 48 mL/min — ABNORMAL LOW (ref >60)
GFR calc non Af Amer: 41 mL/min — ABNORMAL LOW (ref >60)
Glucose, Bld: 103 mg/dL — ABNORMAL HIGH (ref 70–99)
Potassium: 4.5 mmol/L (ref 3.5–5.1)
Sodium: 133 mmol/L — ABNORMAL LOW (ref 135–145)
Total Bilirubin: 0.4 mg/dL (ref 0.3–1.2)
Total Protein: 6.5 g/dL (ref 6.5–8.1)

## 2019-05-30 LAB — CBC WITH DIFFERENTIAL/PLATELET
Abs Immature Granulocytes: 0.05 10*3/uL (ref 0.00–0.07)
Basophils Absolute: 0.1 10*3/uL (ref 0.0–0.1)
Basophils Relative: 1 %
Eosinophils Absolute: 0.3 10*3/uL (ref 0.0–0.5)
Eosinophils Relative: 3 %
HCT: 28.4 % — ABNORMAL LOW (ref 36.0–46.0)
Hemoglobin: 9.4 g/dL — ABNORMAL LOW (ref 12.0–15.0)
Immature Granulocytes: 1 %
Lymphocytes Relative: 23 %
Lymphs Abs: 2.5 10*3/uL (ref 0.7–4.0)
MCH: 30.2 pg (ref 26.0–34.0)
MCHC: 33.1 g/dL (ref 30.0–36.0)
MCV: 91.3 fL (ref 80.0–100.0)
Monocytes Absolute: 1 10*3/uL (ref 0.1–1.0)
Monocytes Relative: 9 %
Neutro Abs: 7.1 10*3/uL (ref 1.7–7.7)
Neutrophils Relative %: 63 %
Platelets: 415 10*3/uL — ABNORMAL HIGH (ref 150–400)
RBC: 3.11 MIL/uL — ABNORMAL LOW (ref 3.87–5.11)
RDW: 14.7 % (ref 11.5–15.5)
WBC: 11 10*3/uL — ABNORMAL HIGH (ref 4.0–10.5)
nRBC: 0 % (ref 0.0–0.2)

## 2019-05-30 LAB — TROPONIN I (HIGH SENSITIVITY): Troponin I (High Sensitivity): 3 ng/L (ref <18)

## 2019-05-30 MED ORDER — CEPHALEXIN 500 MG PO CAPS
500.0000 mg | ORAL_CAPSULE | Freq: Once | ORAL | Status: AC
  Administered 2019-05-30: 500 mg via ORAL
  Filled 2019-05-30: qty 1

## 2019-05-30 MED ORDER — CEPHALEXIN 250 MG PO CAPS: 250.0000 mg | ORAL_CAPSULE | Freq: Four times a day (QID) | ORAL | 0 refills | Status: AC

## 2019-05-30 NOTE — ED Notes (Signed)
Pt daughter stating that facility stated pt hit head with fall today.

## 2019-05-30 NOTE — ED Provider Notes (Signed)
[facility] Emergency Department Provider Note   ____________________________________________   I have reviewed the triage vital signs and the nursing notes.   HISTORY  Chief Complaint Fall   History limited by: Not Limited   HPI Chelsea [name] is a 85 y.o. female who presents to the emergency department today because of concern for falls. The patient states she has had two falls today. Has had recent falls as well. Does state she hit her head today after the falls. Had some pain initially but denies any pain at the time of my exam. Says she was bending over to pick something off of the floor when she most recently fell. The patient denies any fevers, chest pain or shortness of breath. Denies any pain in the extremities.   Records reviewed. Per medical record review patient has a history of COPD, DM.   Past Medical History:  Diagnosis Date  . Anemia   . Chronic obstructive asthma ([place])   . COPD (chronic obstructive pulmonary disease) ([place])   . Depression   . Diabetes mellitus without complication ([place])   . Femoral bruit   . GERD without esophagitis   . Hypertension   . Hypertension   . Lumbago   . Neuropathy   . Osteoarthritis   . Psoriasis   . Tremor   . Ulcerative colitis ([place])   . Vertigo     Patient Active Problem List   Diagnosis Date Noted  . Blood in stool   . Acute peptic ulcer of stomach   . Melena 07/15/2018  . GIB (gastrointestinal bleeding) 07/15/2018  . External hemorrhoids 07/09/2018  . Neuropathy due to type 2 diabetes mellitus ([place]) 06/29/2018  . Felon of finger of right hand 06/29/2018  . Vascular dementia with behavior disturbance ([place]) 05/15/2018  . Agitation 05/15/2018  . Essential hypertension, benign 03/02/2018  . Peripheral neuropathy 03/02/2018  . Chronic arthritis 03/02/2018  . Depression with anxiety 03/02/2018  . Chronic constipation 03/02/2018  . Chronic obstructive pulmonary disease (COPD) ([place]) 05/21/2016   . Diabetes mellitus type 2, noninsulin dependent ([place]) 05/21/2016  . Physical deconditioning 05/21/2016    Past Surgical History:  Procedure Laterality Date  . ABDOMINAL HYSTERECTOMY    . APPENDECTOMY    . ESOPHAGOGASTRODUODENOSCOPY (EGD) WITH PROPOFOL N/A 07/16/2018   Procedure: ESOPHAGOGASTRODUODENOSCOPY (EGD) WITH PROPOFOL;  Surgeon: [name], MD;  Location: [facility] ENDOSCOPY;  Service: Endoscopy;  Laterality: N/A;  . EYE SURGERY      Prior to Admission medications   Medication Sig Start Date End Date Taking? Authorizing Provider  acetaminophen (TYLENOL) 325 MG tablet Take 650 mg by mouth every 4 (four) hours as needed for mild pain or fever.  02/25/18   [provider]  alum & mag hydroxide-simeth (MAG-AL PLUS XS) 400-400-40 MG/5ML suspension Take 15 mLs by mouth every 6 (six) hours as needed for indigestion. 04/04/18   [provider]  donepezil (ARICEPT) 10 MG tablet Take 10 mg by mouth at bedtime.    [provider]  DULoxetine (CYMBALTA) 60 MG capsule Take 60 mg by mouth at bedtime.  06/24/18   [provider]  ferrous sulfate 325 (65 FE) MG EC tablet Take 1 tablet (325 mg total) by mouth 2 (two) times daily with a meal for 30 days. 07/22/18 08/21/18  Hillary Bow, MD  Fluticasone-Salmeterol (ADVAIR) 250-50 MCG/DOSE AEPB Inhale 2 puffs into the lungs 2 (two) times daily.     [provider]  guaifenesin (ROBITUSSIN) 100 MG/5ML syrup Take 200  mg by mouth every 6 (six) hours as needed for cough. 04/04/18   [provider]  lamoTRIgine (LAMICTAL) 25 MG tablet Take 25 mg by mouth 2 (two) times daily.  05/29/18   [provider]  loratadine (CLARITIN) 10 MG tablet Take 10 mg by mouth daily.     [provider]  losartan (COZAAR) 50 MG tablet Take 1 tablet (50 mg total) by mouth daily. 07/19/18   Gladstone Lighter, MD  magnesium hydroxide (MILK OF MAGNESIA) 400 MG/5ML suspension Take 30 mLs by mouth daily as needed for  mild constipation. 07/19/18   Gladstone Lighter, MD  Melatonin 3 MG TABS Take 1 tablet by mouth at bedtime. 05/13/18   [provider]  metFORMIN (GLUCOPHAGE-XR) 500 MG 24 hr tablet Take 500 mg by mouth daily with breakfast.    [provider]  metoprolol tartrate (LOPRESSOR) 25 MG tablet Take 25 mg by mouth 2 (two) times daily.    [provider]  mirtazapine (REMERON) 7.5 MG tablet Take 7.5 mg by mouth at bedtime. 04/05/18   [provider]  NON FORMULARY Diet Type: Regular    [provider]  OXYGEN Inhale 2 L/min into the lungs at bedtime. 07/04/18   [provider]  traMADol (ULTRAM) 50 MG tablet Take 1 tablet (50 mg total) by mouth every 6 (six) hours as needed for moderate pain or severe pain. 07/19/18   Gladstone Lighter, MD    Allergies Lyrica [pregabalin]  Family History  Problem Relation Age of Onset  . Diabetes Mother   . Liver cancer Father     Social History Social History   Tobacco Use  . Smoking status: Former Smoker    Types: Cigarettes  . Smokeless tobacco: Never Used  Substance Use Topics  . Alcohol use: No  . Drug use: No    Review of Systems Constitutional: No fever/chills Eyes: No visual changes. ENT: No sore throat. Cardiovascular: Denies chest pain. Respiratory: Denies shortness of breath. Gastrointestinal: No abdominal pain.  No nausea, no vomiting.  No diarrhea.   Genitourinary: Negative for dysuria. Musculoskeletal: Negative for back pain. Skin: Negative for rash. Neurological: Positive for headache.  ____________________________________________   PHYSICAL EXAM:  VITAL SIGNS: ED Triage Vitals  Enc Vitals Group     BP 05/30/19 1226 (!) 111/48     Pulse Rate 05/30/19 1226 64     Resp 05/30/19 1226 16     Temp 05/30/19 1226 97.8 F (36.6 C)     Temp Source 05/30/19 1226 Oral     SpO2 05/30/19 1226 100 %     Weight 05/30/19 1227 135 lb 12.9 oz (61.6 kg)     Height 05/30/19 1227 5' 3"   (1.6 m)     Head Circumference --      [facility] --      Pain Score 05/30/19 1227 10   Constitutional: Alert and oriented.  Eyes: Conjunctivae are normal.  ENT      Head: Normocephalic. Bruising noted to left forehead with bandage in place.       Nose: No congestion/rhinnorhea.      Mouth/Throat: Mucous membranes are moist.      Neck: No stridor. No midline tenderness.  Hematological/Lymphatic/Immunilogical: No cervical lymphadenopathy. Cardiovascular: Normal rate, regular rhythm.  No murmurs, rubs, or gallops.  Respiratory: Normal respiratory effort without tachypnea nor retractions. Breath sounds are clear and equal bilaterally. No wheezes/rales/rhonchi. Gastrointestinal: Soft and non tender. No rebound. No guarding.  Genitourinary: Deferred Musculoskeletal: Normal  range of motion in all extremities. No lower extremity edema. Neurologic:  Normal speech and language. No gross focal neurologic deficits are appreciated.  Skin:  Skin is warm, dry. Bruising to left forehead. Psychiatric: Mood and affect are normal. Speech and behavior are normal. Patient exhibits appropriate insight and judgment.  ____________________________________________    LABS (pertinent positives/negatives)  Trop hs 3 CBC wbc 11.0, hgb 9.4, plt 415 CMP na 133, k 4.5, glu 103, cr 1.19  ____________________________________________   EKG  I, Nance Pear, attending physician, personally viewed and interpreted this EKG  EKG Time: 1220 Rate: 67 Rhythm: sinus rhythm Axis: normal Intervals: qtc 389 QRS: low voltage ST changes: no st elevation Impression: abnormal ekg ____________________________________________    RADIOLOGY  CT head/cervical spine No acute abnormality Question nodule in chest  ____________________________________________   PROCEDURES  Procedures  ____________________________________________   INITIAL IMPRESSION / ASSESSMENT AND PLAN / ED COURSE  Pertinent labs &  imaging results that were available during my care of the patient were reviewed by me and considered in my medical decision making (see chart for details).   Patient presented to the emergency department after multiple falls today. Head and cervical spine did not show any acute traumatic injuries. Blood work shows anemia, however hgb better than previous blood work. Awaiting UA at time of sign out. Would anticipate discharge.   ____________________________________________   FINAL CLINICAL IMPRESSION(S) / ED DIAGNOSES  Final diagnoses:  Fall, initial encounter     Note: This dictation was prepared with Dragon dictation. Any transcriptional errors that result from this process are unintentional     Nance Pear, MD 05/30/19 [id number]

## 2019-05-30 NOTE — ED Notes (Signed)
E-signature not working at this time. Pt, caregiver at facility, and daughter verbalized understanding of D/C instructions, prescriptions and follow up care with no further questions at this time. Pt in NAD. EMS called for ride back to facility.

## 2019-05-30 NOTE — ED Provider Notes (Signed)
-----------------------------------------   5:48 PM on 05/30/2019 -----------------------------------------  I took over care of this patient from Dr. Archie Balboa.  The patient had negative imaging and was awaiting a urinalysis to determine if she could potentially have a UTI as a cause of her weakness and falls.  The urine does show >50 WBCs and leukocytes, consistent with possible UTI.  I will start the patient on empiric treatment with Keflex.  The patient feels comfortable and is stable for discharge.  Return precautions provided.   Arta Silence, MD 05/30/19 [phone]

## 2019-05-30 NOTE — Discharge Instructions (Addendum)
Take the antibiotic as prescribed.  Please follow up with your primary care doctor about obtaining further chest images. Please seek medical attention for any high fevers, chest pain, shortness of breath, change in behavior, persistent vomiting, bloody stool or any other new or concerning symptoms.

## 2019-05-30 NOTE — ED Notes (Signed)
Pt noted to have diarrhea in brief. MD Siadecki aware. Pt cleaned and repositioned in bed by this RN. New brief and sheets applied.

## 2019-05-30 NOTE — ED Triage Notes (Signed)
Pt presents from [facility] with c/o multiple falls. Pt fell 3 times today trying to get out of bed. Pt has hematoma to left side of forehead from prior fall. Pt states she fell and hit tail bone with fall today. Pt c/o 10/10 pain on left side of tail bone. Pt denies blood thinner use. Pt currently alert and oriented x4 at this time.

## 2019-05-30 NOTE — ED Notes (Signed)
Pt resting comfortably at this time. Still awaiting EMS transport.

## 2020-04-06 ENCOUNTER — Inpatient Hospital Stay
Admission: EM | Admit: 2020-04-06 | Discharge: 2020-04-09 | DRG: 481 | Disposition: A | Discharge status: Skilled Nursing Facility | Payer: Medicare Other | Source: Skilled Nursing Facility | Attending: Internal Medicine | Admitting: Internal Medicine

## 2020-04-06 ENCOUNTER — Emergency Department: Payer: Medicare Other

## 2020-04-06 ENCOUNTER — Other Ambulatory Visit: Payer: Self-pay

## 2020-04-06 DIAGNOSIS — N189 Chronic kidney disease, unspecified: Secondary | ICD-10-CM | POA: Diagnosis present

## 2020-04-06 DIAGNOSIS — Z7984 Long term (current) use of oral hypoglycemic drugs: Secondary | ICD-10-CM

## 2020-04-06 DIAGNOSIS — E119 Type 2 diabetes mellitus without complications: Secondary | ICD-10-CM

## 2020-04-06 DIAGNOSIS — F0151 Vascular dementia with behavioral disturbance: Secondary | ICD-10-CM | POA: Diagnosis present

## 2020-04-06 DIAGNOSIS — F01518 Vascular dementia, unspecified severity, with other behavioral disturbance: Secondary | ICD-10-CM | POA: Diagnosis present

## 2020-04-06 DIAGNOSIS — K5909 Other constipation: Secondary | ICD-10-CM | POA: Diagnosis present

## 2020-04-06 DIAGNOSIS — E1122 Type 2 diabetes mellitus with diabetic chronic kidney disease: Secondary | ICD-10-CM | POA: Diagnosis present

## 2020-04-06 DIAGNOSIS — Z20822 Contact with and (suspected) exposure to covid-19: Secondary | ICD-10-CM | POA: Diagnosis present

## 2020-04-06 DIAGNOSIS — Z7951 Long term (current) use of inhaled steroids: Secondary | ICD-10-CM

## 2020-04-06 DIAGNOSIS — L409 Psoriasis, unspecified: Secondary | ICD-10-CM | POA: Diagnosis present

## 2020-04-06 DIAGNOSIS — Y92128 Other place in nursing home as the place of occurrence of the external cause: Secondary | ICD-10-CM

## 2020-04-06 DIAGNOSIS — S72012A Unspecified intracapsular fracture of left femur, initial encounter for closed fracture: Secondary | ICD-10-CM | POA: Diagnosis not present

## 2020-04-06 DIAGNOSIS — M1990 Unspecified osteoarthritis, unspecified site: Secondary | ICD-10-CM | POA: Diagnosis present

## 2020-04-06 DIAGNOSIS — F0390 Unspecified dementia without behavioral disturbance: Secondary | ICD-10-CM

## 2020-04-06 DIAGNOSIS — Z87891 Personal history of nicotine dependence: Secondary | ICD-10-CM

## 2020-04-06 DIAGNOSIS — R296 Repeated falls: Secondary | ICD-10-CM | POA: Diagnosis present

## 2020-04-06 DIAGNOSIS — F32A Depression, unspecified: Secondary | ICD-10-CM | POA: Diagnosis present

## 2020-04-06 DIAGNOSIS — Z66 Do not resuscitate: Secondary | ICD-10-CM | POA: Diagnosis present

## 2020-04-06 DIAGNOSIS — Z9981 Dependence on supplemental oxygen: Secondary | ICD-10-CM

## 2020-04-06 DIAGNOSIS — E860 Dehydration: Secondary | ICD-10-CM | POA: Diagnosis present

## 2020-04-06 DIAGNOSIS — M25552 Pain in left hip: Secondary | ICD-10-CM | POA: Diagnosis not present

## 2020-04-06 DIAGNOSIS — E875 Hyperkalemia: Secondary | ICD-10-CM | POA: Diagnosis present

## 2020-04-06 DIAGNOSIS — E1140 Type 2 diabetes mellitus with diabetic neuropathy, unspecified: Secondary | ICD-10-CM | POA: Diagnosis present

## 2020-04-06 DIAGNOSIS — K219 Gastro-esophageal reflux disease without esophagitis: Secondary | ICD-10-CM | POA: Diagnosis present

## 2020-04-06 DIAGNOSIS — Z419 Encounter for procedure for purposes other than remedying health state, unspecified: Secondary | ICD-10-CM

## 2020-04-06 DIAGNOSIS — I10 Essential (primary) hypertension: Secondary | ICD-10-CM | POA: Diagnosis present

## 2020-04-06 DIAGNOSIS — J449 Chronic obstructive pulmonary disease, unspecified: Secondary | ICD-10-CM | POA: Diagnosis present

## 2020-04-06 DIAGNOSIS — S72002A Fracture of unspecified part of neck of left femur, initial encounter for closed fracture: Secondary | ICD-10-CM | POA: Diagnosis not present

## 2020-04-06 DIAGNOSIS — F418 Other specified anxiety disorders: Secondary | ICD-10-CM | POA: Diagnosis present

## 2020-04-06 DIAGNOSIS — Z993 Dependence on wheelchair: Secondary | ICD-10-CM

## 2020-04-06 DIAGNOSIS — N179 Acute kidney failure, unspecified: Secondary | ICD-10-CM

## 2020-04-06 DIAGNOSIS — Z833 Family history of diabetes mellitus: Secondary | ICD-10-CM

## 2020-04-06 DIAGNOSIS — Z79899 Other long term (current) drug therapy: Secondary | ICD-10-CM

## 2020-04-06 DIAGNOSIS — E1142 Type 2 diabetes mellitus with diabetic polyneuropathy: Secondary | ICD-10-CM | POA: Diagnosis present

## 2020-04-06 DIAGNOSIS — Z800 Family history of malignant neoplasm of digestive organs: Secondary | ICD-10-CM

## 2020-04-06 DIAGNOSIS — W050XXA Fall from non-moving wheelchair, initial encounter: Secondary | ICD-10-CM | POA: Diagnosis present

## 2020-04-06 DIAGNOSIS — J9610 Chronic respiratory failure, unspecified whether with hypoxia or hypercapnia: Secondary | ICD-10-CM | POA: Diagnosis present

## 2020-04-06 DIAGNOSIS — Z888 Allergy status to other drugs, medicaments and biological substances status: Secondary | ICD-10-CM

## 2020-04-06 DIAGNOSIS — I129 Hypertensive chronic kidney disease with stage 1 through stage 4 chronic kidney disease, or unspecified chronic kidney disease: Secondary | ICD-10-CM | POA: Diagnosis present

## 2020-04-06 LAB — URINALYSIS, COMPLETE (UACMP) WITH MICROSCOPIC
Bilirubin Urine: NEGATIVE
Glucose, UA: NEGATIVE mg/dL
Hgb urine dipstick: NEGATIVE
Ketones, ur: NEGATIVE mg/dL
Leukocytes,Ua: NEGATIVE
Nitrite: NEGATIVE
Protein, ur: 100 mg/dL — AB
Specific Gravity, Urine: 1.011 (ref 1.005–1.030)
Squamous Epithelial / HPF: NONE SEEN (ref 0–5)
pH: 6 (ref 5.0–8.0)

## 2020-04-06 LAB — CBC WITH DIFFERENTIAL/PLATELET
Abs Immature Granulocytes: 0.03 10*3/uL (ref 0.00–0.07)
Basophils Absolute: 0.1 10*3/uL (ref 0.0–0.1)
Basophils Relative: 1 %
Eosinophils Absolute: 0.2 10*3/uL (ref 0.0–0.5)
Eosinophils Relative: 2 %
HCT: 35.2 % — ABNORMAL LOW (ref 36.0–46.0)
Hemoglobin: 11.5 g/dL — ABNORMAL LOW (ref 12.0–15.0)
Immature Granulocytes: 0 %
Lymphocytes Relative: 25 %
Lymphs Abs: 2.4 10*3/uL (ref 0.7–4.0)
MCH: 30.8 pg (ref 26.0–34.0)
MCHC: 32.7 g/dL (ref 30.0–36.0)
MCV: 94.4 fL (ref 80.0–100.0)
Monocytes Absolute: 0.9 10*3/uL (ref 0.1–1.0)
Monocytes Relative: 10 %
Neutro Abs: 5.8 10*3/uL (ref 1.7–7.7)
Neutrophils Relative %: 62 %
Platelets: 289 10*3/uL (ref 150–400)
RBC: 3.73 MIL/uL — ABNORMAL LOW (ref 3.87–5.11)
RDW: 13.2 % (ref 11.5–15.5)
WBC: 9.5 10*3/uL (ref 4.0–10.5)
nRBC: 0 % (ref 0.0–0.2)

## 2020-04-06 LAB — COMPREHENSIVE METABOLIC PANEL
ALT: 7 U/L (ref 0–44)
AST: 28 U/L (ref 15–41)
Albumin: 3.4 g/dL — ABNORMAL LOW (ref 3.5–5.0)
Alkaline Phosphatase: 84 U/L (ref 38–126)
Anion gap: 11 (ref 5–15)
BUN: 19 mg/dL (ref 8–23)
CO2: 27 mmol/L (ref 22–32)
Calcium: 9 mg/dL (ref 8.9–10.3)
Chloride: 98 mmol/L (ref 98–111)
Creatinine, Ser: 1.56 mg/dL — ABNORMAL HIGH (ref 0.44–1.00)
GFR, Estimated: 32 mL/min — ABNORMAL LOW (ref >60)
Glucose, Bld: 118 mg/dL — ABNORMAL HIGH (ref 70–99)
Potassium: 5.2 mmol/L — ABNORMAL HIGH (ref 3.5–5.1)
Sodium: 136 mmol/L (ref 135–145)
Total Bilirubin: 1.3 mg/dL — ABNORMAL HIGH (ref 0.3–1.2)
Total Protein: 7 g/dL (ref 6.5–8.1)

## 2020-04-06 LAB — APTT: aPTT: 35 seconds (ref 24–36)

## 2020-04-06 LAB — AMMONIA: Ammonia: 11 umol/L (ref 9–35)

## 2020-04-06 LAB — HEMOGLOBIN A1C
Hgb A1c MFr Bld: 6.3 % — ABNORMAL HIGH (ref 4.8–5.6)
Mean Plasma Glucose: 134.11 mg/dL

## 2020-04-06 LAB — RESPIRATORY PANEL BY RT PCR (FLU A&B, COVID)
Influenza A by PCR: NEGATIVE
Influenza B by PCR: NEGATIVE
SARS Coronavirus 2 by RT PCR: NEGATIVE

## 2020-04-06 LAB — PROTIME-INR
INR: 1.1 (ref 0.8–1.2)
Prothrombin Time: 13.5 seconds (ref 11.4–15.2)

## 2020-04-06 LAB — GLUCOSE, CAPILLARY: Glucose-Capillary: 152 mg/dL — ABNORMAL HIGH (ref 70–99)

## 2020-04-06 LAB — SURGICAL PCR SCREEN
MRSA, PCR: POSITIVE — AB
Staphylococcus aureus: POSITIVE — AB

## 2020-04-06 LAB — BRAIN NATRIURETIC PEPTIDE: B Natriuretic Peptide: 84.2 pg/mL (ref 0.0–100.0)

## 2020-04-06 LAB — TSH: TSH: 1.206 u[IU]/mL (ref 0.350–4.500)

## 2020-04-06 MED ORDER — MIRTAZAPINE 15 MG PO TABS: 7.5000 mg | ORAL_TABLET | Freq: Every day | ORAL | Status: DC

## 2020-04-06 MED ORDER — HYDROCODONE-ACETAMINOPHEN 5-325 MG PO TABS: 1.0000 | ORAL_TABLET | Freq: Four times a day (QID) | ORAL | Status: DC | PRN

## 2020-04-06 MED ORDER — MORPHINE SULFATE (PF) 2 MG/ML IV SOLN: 0.5000 mg | INTRAVENOUS | Status: DC | PRN

## 2020-04-06 MED ORDER — HYDROMORPHONE HCL 1 MG/ML IJ SOLN
1.0000 mg | INTRAMUSCULAR | Status: DC | PRN
  Administered 2020-04-06 – 2020-04-07 (×2): 1 mg via INTRAVENOUS
  Filled 2020-04-06 (×2): qty 1

## 2020-04-06 MED ORDER — LORATADINE 10 MG PO TABS
10.0000 mg | ORAL_TABLET | Freq: Every day | ORAL | Status: DC
  Administered 2020-04-08 – 2020-04-09 (×2): 10 mg via ORAL
  Filled 2020-04-06 (×2): qty 1

## 2020-04-06 MED ORDER — DULOXETINE HCL 60 MG PO CPEP
120.0000 mg | ORAL_CAPSULE | Freq: Every day | ORAL | Status: DC
  Administered 2020-04-08 – 2020-04-09 (×2): 120 mg via ORAL
  Filled 2020-04-06 (×3): qty 2

## 2020-04-06 MED ORDER — LACTATED RINGERS IV BOLUS
1000.0000 mL | Freq: Once | INTRAVENOUS | Status: AC
  Administered 2020-04-06: 1000 mL via INTRAVENOUS

## 2020-04-06 MED ORDER — POLYETHYLENE GLYCOL 3350 17 G PO PACK: 17.0000 g | PACK | Freq: Every day | ORAL | Status: DC

## 2020-04-06 MED ORDER — FLUTICASONE FUROATE-VILANTEROL 200-25 MCG/INH IN AEPB
1.0000 | INHALATION_SPRAY | Freq: Every day | RESPIRATORY_TRACT | Status: DC
  Filled 2020-04-06: qty 28

## 2020-04-06 MED ORDER — METOPROLOL TARTRATE 25 MG PO TABS
25.0000 mg | ORAL_TABLET | Freq: Two times a day (BID) | ORAL | Status: DC
  Administered 2020-04-07 – 2020-04-09 (×5): 25 mg via ORAL
  Filled 2020-04-06 (×5): qty 1

## 2020-04-06 MED ORDER — CEFAZOLIN SODIUM-DEXTROSE 1-4 GM/50ML-% IV SOLN
1.0000 g | Freq: Once | INTRAVENOUS | Status: AC
  Administered 2020-04-07: 1 g via INTRAVENOUS
  Filled 2020-04-06: qty 50

## 2020-04-06 MED ORDER — DONEPEZIL HCL 5 MG PO TABS
10.0000 mg | ORAL_TABLET | Freq: Every day | ORAL | Status: DC
  Administered 2020-04-07 – 2020-04-08 (×2): 10 mg via ORAL
  Filled 2020-04-06 (×2): qty 2

## 2020-04-06 MED ORDER — LOSARTAN POTASSIUM 50 MG PO TABS: 50.0000 mg | ORAL_TABLET | Freq: Every day | ORAL | Status: DC

## 2020-04-06 MED ORDER — LAMOTRIGINE 25 MG PO TABS
25.0000 mg | ORAL_TABLET | Freq: Two times a day (BID) | ORAL | Status: DC
  Administered 2020-04-07 – 2020-04-09 (×4): 25 mg via ORAL
  Filled 2020-04-06 (×4): qty 1

## 2020-04-06 MED ORDER — PRO-STAT SUGAR FREE PO LIQD: 30.0000 mL | Freq: Two times a day (BID) | ORAL | Status: DC

## 2020-04-06 MED ORDER — PANTOPRAZOLE SODIUM 40 MG PO TBEC
40.0000 mg | DELAYED_RELEASE_TABLET | Freq: Every day | ORAL | Status: DC
  Administered 2020-04-07 – 2020-04-09 (×3): 40 mg via ORAL
  Filled 2020-04-06 (×3): qty 1

## 2020-04-06 MED ORDER — INSULIN ASPART 100 UNIT/ML ~~LOC~~ SOLN
0.0000 [IU] | Freq: Three times a day (TID) | SUBCUTANEOUS | Status: DC
  Administered 2020-04-07 – 2020-04-08 (×3): 2 [IU] via SUBCUTANEOUS
  Administered 2020-04-09: 3 [IU] via SUBCUTANEOUS
  Filled 2020-04-06 (×4): qty 1

## 2020-04-06 MED ORDER — GABAPENTIN 300 MG PO CAPS: 300.0000 mg | ORAL_CAPSULE | Freq: Four times a day (QID) | ORAL | Status: DC

## 2020-04-06 MED ORDER — HEPARIN SODIUM (PORCINE) 5000 UNIT/ML IJ SOLN: 5000.0000 [IU] | Freq: Once | INTRAMUSCULAR | Status: DC

## 2020-04-06 NOTE — ED Notes (Signed)
Attempted to call report lost connection

## 2020-04-06 NOTE — Assessment & Plan Note (Signed)
PRN Pain control with dilaudid. PT and sx plan per orthopedic we apprecaite management.

## 2020-04-06 NOTE — H&P (Signed)
History and Physical    Chelsea [name] ZGY:[id number] DOB: 08/28/1930 DOA: 04/06/2020  PCP: Pcp, No    Patient coming from:  SNF   Chief Complaint:  Left hip fracture.   HPI: Chelsea [name] is a 86 y.o. female with medical history significant of DM II, HTN, COPD,UC, seen in ed for fall that happened a week ago and left hip fracture.Per pt her caretaker was pushing her in wheelchair and she slid to left side. Pt is alert awake and oriented and has dry mouth but speech is clear. Orthopedic dr.Sunny Posey Pronto has been consulted and pt is on her way to ct scan we will give hr ne dose of Dilaudid iv as she is in pain and crying.   ED Course:  Vitals:   04/06/20 1640 04/06/20 1641 04/06/20 1814  BP: (!) 125/101  128/89  Pulse: 75  76  Resp:  17 12  Temp: 98.5 F (36.9 C)  98.8 F (37.1 C)  TempSrc: Oral  Oral  SpO2: 95%  100%  Labs shows mildly elevated potassium and creatinine of 1.56.resp panel is negative.  Review of Systems:  Review of Systems  Constitutional: Negative.   HENT: Negative.   Eyes: Negative.   Respiratory: Negative.   Cardiovascular: Negative.   All other systems reviewed and are negative.    Past Medical History:  Diagnosis Date  . Anemia   . Chronic obstructive asthma ([place])   . COPD (chronic obstructive pulmonary disease) ([place])   . Depression   . Diabetes mellitus without complication ([place])   . Femoral bruit   . GERD without esophagitis   . Hypertension   . Hypertension   . Lumbago   . Neuropathy   . Osteoarthritis   . Psoriasis   . Tremor   . Ulcerative colitis ([place])   . Vertigo     Past Surgical History:  Procedure Laterality Date  . ABDOMINAL HYSTERECTOMY    . APPENDECTOMY    . ESOPHAGOGASTRODUODENOSCOPY (EGD) WITH PROPOFOL N/A 07/16/2018   Procedure: ESOPHAGOGASTRODUODENOSCOPY (EGD) WITH PROPOFOL;  Surgeon: [name], MD;  Location: [facility] ENDOSCOPY;  Service: Endoscopy;  Laterality: N/A;  . EYE SURGERY       reports that she has  quit smoking. Her smoking use included cigarettes. She has never used smokeless tobacco. She reports that she does not drink alcohol and does not use drugs.  Allergies  Allergen Reactions  . Lyrica [Pregabalin] Swelling    Family History  Problem Relation Age of Onset  . Diabetes Mother   . Liver cancer Father     Prior to Admission medications   Medication Sig Start Date End Date Taking? Authorizing Provider  acetaminophen (TYLENOL) 325 MG tablet Take 650 mg by mouth every 4 (four) hours as needed for mild pain or fever.  02/25/18   [provider]  Amino Acids-Protein Hydrolys (FEEDING SUPPLEMENT, PRO-STAT SUGAR FREE 64,) LIQD Take 30 mLs by mouth 2 (two) times daily.    [provider]  diphenhydrAMINE (BENADRYL) 25 mg capsule Take 25 mg by mouth every 6 (six) hours as needed.    [provider]  donepezil (ARICEPT) 10 MG tablet Take 10 mg by mouth at bedtime.    [provider]  DULoxetine (CYMBALTA) 60 MG capsule Take 120 mg by mouth daily.  06/24/18   [provider]  ferrous sulfate 325 (65 FE) MG EC tablet Take 1 tablet (325 mg total) by mouth 2 (two) times daily with a meal for 30 days.  07/22/18 05/30/19  Hillary Bow, MD  Fluticasone-Salmeterol (ADVAIR) 250-50 MCG/DOSE AEPB Inhale 2 puffs into the lungs 2 (two) times daily.     [provider]  gabapentin (NEURONTIN) 300 MG capsule Take 300 mg by mouth 4 (four) times daily.    [provider]  guaifenesin (ROBITUSSIN) 100 MG/5ML syrup Take 200 mg by mouth every 6 (six) hours as needed for cough. 04/04/18   [provider]  lamoTRIgine (LAMICTAL) 25 MG tablet Take 25 mg by mouth 2 (two) times daily.  05/29/18   [provider]  loratadine (CLARITIN) 10 MG tablet Take 10 mg by mouth daily.     [provider]  losartan (COZAAR) 50 MG tablet Take 1 tablet (50 mg total) by mouth daily. 07/19/18   Gladstone Lighter, MD  magnesium hydroxide (MILK  OF MAGNESIA) 400 MG/5ML suspension Take 30 mLs by mouth daily as needed for mild constipation. 07/19/18   Gladstone Lighter, MD  magnesium oxide (MAG-OX) 400 MG tablet Take 800 mg by mouth 2 (two) times daily.    [provider]  Melatonin 3 MG TABS Take 1 tablet by mouth at bedtime. 05/13/18   [provider]  metFORMIN (GLUCOPHAGE-XR) 750 MG 24 hr tablet Take 750 mg by mouth 2 (two) times daily.     [provider]  metoprolol tartrate (LOPRESSOR) 25 MG tablet Take 25 mg by mouth 2 (two) times daily.    [provider]  mirtazapine (REMERON) 7.5 MG tablet Take 7.5 mg by mouth at bedtime. 04/05/18   [provider]  Multiple Vitamins-Minerals (MULTIVITAMIN WITH MINERALS) tablet Take 1 tablet by mouth daily.    [provider]  NON FORMULARY Diet Type: Regular    [provider]  OXYGEN Inhale 2 L/min into the lungs at bedtime. 07/04/18   [provider]  pantoprazole (PROTONIX) 40 MG tablet Take 40 mg by mouth daily.    [provider]  polyethylene glycol (MIRALAX / GLYCOLAX) 17 g packet Take 17 g by mouth daily.    [provider]    Physical Exam: Vitals:   04/06/20 1640 04/06/20 1641 04/06/20 1814  BP: (!) 125/101  128/89  Pulse: 75  76  Resp:  17 12  Temp: 98.5 F (36.9 C)  98.8 F (37.1 C)  TempSrc: Oral  Oral  SpO2: 95%  100%    Physical Exam Vitals and nursing note reviewed.  HENT:     Head: Atraumatic.     Right Ear: External ear normal.     Left Ear: External ear normal.     Nose: Nose normal.     Mouth/Throat:     Mouth: Mucous membranes are dry.  Eyes:     Extraocular Movements: Extraocular movements intact.     Pupils: Pupils are equal, round, and reactive to light.  Cardiovascular:     Rate and Rhythm: Normal rate and regular rhythm.  Pulmonary:     Effort: Pulmonary effort is normal.     Breath sounds: Rhonchi present.  Abdominal:     Palpations: Abdomen is soft.   Musculoskeletal:     Left hip: Deformity and tenderness present.     Comments: Left left is ext rotated and unable to assess rom due to pain. Pulses present.   Neurological:     General: No focal deficit present.     Mental Status: She is alert and oriented to person, place, and time.  Psychiatric:     Comments: Crying due  to pain.       Labs on Admission: I have personally reviewed following labs and imaging studies  CBC: Recent Labs  Lab 04/06/20 1643  WBC 9.5  NEUTROABS 5.8  HGB 11.5*  HCT 35.2*  MCV 94.4  PLT 017   Basic Metabolic Panel: Recent Labs  Lab 04/06/20 1643  NA 136  K 5.2*  CL 98  CO2 27  GLUCOSE 118*  BUN 19  CREATININE 1.56*  CALCIUM 9.0   GFR: CrCl cannot be calculated (Unknown ideal weight.). Liver Function Tests: Recent Labs  Lab 04/06/20 1643  AST 28  ALT 7  ALKPHOS 84  BILITOT 1.3*  PROT 7.0  ALBUMIN 3.4*   No results for input(s): LIPASE, AMYLASE in the last 168 hours. No results for input(s): AMMONIA in the last 168 hours. Coagulation Profile: No results for input(s): INR, PROTIME in the last 168 hours. Cardiac Enzymes: No results for input(s): CKTOTAL, CKMB, CKMBINDEX, TROPONINI in the last 168 hours. BNP (last 3 results) No results for input(s): PROBNP in the last 8760 hours. HbA1C: No results for input(s): HGBA1C in the last 72 hours. CBG: No results for input(s): GLUCAP in the last 168 hours. Lipid Profile: No results for input(s): CHOL, HDL, LDLCALC, TRIG, CHOLHDL, LDLDIRECT in the last 72 hours. Thyroid Function Tests: Recent Labs    04/06/20 1702  TSH 1.206   Anemia Panel: No results for input(s): VITAMINB12, FOLATE, FERRITIN, TIBC, IRON, RETICCTPCT in the last 72 hours. Urine analysis:    Component Value Date/Time   COLORURINE YELLOW (A) 04/06/2020 1702   APPEARANCEUR HAZY (A) 04/06/2020 1702   APPEARANCEUR Hazy 02/18/2012 1557   LABSPEC 1.011 04/06/2020 1702   LABSPEC 1.025 02/18/2012 1557   PHURINE 6.0  04/06/2020 1702   GLUCOSEU NEGATIVE 04/06/2020 1702   GLUCOSEU >=500 02/18/2012 1557   HGBUR NEGATIVE 04/06/2020 1702   BILIRUBINUR NEGATIVE 04/06/2020 1702   BILIRUBINUR Negative 02/18/2012 1557   KETONESUR NEGATIVE 04/06/2020 1702   PROTEINUR 100 (A) 04/06/2020 1702   NITRITE NEGATIVE 04/06/2020 1702   LEUKOCYTESUR NEGATIVE 04/06/2020 1702   LEUKOCYTESUR 1+ 02/18/2012 1557   No intake or output data in the 24 hours ending 04/06/20 1819 Lab Results  Component Value Date   CREATININE 1.56 (H) 04/06/2020   CREATININE 1.19 (H) 05/30/2019   CREATININE 1.11 (H) 07/16/2018    COVID-19 Labs  No results for input(s): DDIMER, FERRITIN, LDH, CRP in the last 72 hours.  Lab Results  Component Value Date   [place] NEGATIVE 04/06/2020    Radiological Exams on Admission: DG Chest 1 View  Result Date: 04/06/2020 CLINICAL DATA:  Fall. EXAM: CHEST  1 VIEW COMPARISON:  July 15, 2018. FINDINGS: The heart size and mediastinal contours are within normal limits. No pneumothorax is noted. Right lung is clear. Mild left basilar subsegmental atelectasis or infiltrate is noted laterally with possible small effusion. The visualized skeletal structures are unremarkable. IMPRESSION: Mild left basilar subsegmental atelectasis or infiltrate is noted laterally with possible small left pleural effusion. Aortic Atherosclerosis (ICD10-I70.0). Electronically Signed   By: Marijo Conception M.D.   On: 04/06/2020 17:36   DG Hip Unilat W or Wo Pelvis 2-3 Views Left  Result Date: 04/06/2020 CLINICAL DATA:  Left hip pain after fall. EXAM: DG HIP (WITH OR WITHOUT PELVIS) 2-3V LEFT COMPARISON:  None. FINDINGS: Mildly displaced subcapital proximal left femoral fracture is noted. Right hip is unremarkable. IMPRESSION: Mildly displaced subcapital proximal left femoral fracture. Electronically Signed   By: Sabino Dick  Jr M.D.   On: 04/06/2020 17:35    EKG: Independently reviewed.   None.    Assessment/Plan Essential hypertension, benign Assessment & Plan WE will hold losartan on account of aki on CKD and cont metoprolol. I suspect prn Dilaudid will also helped her bp to be much lower.  Cardiac diet.  Chronic obstructive pulmonary disease (COPD) (HCC) Assessment & Plan Cont pt on her Advair and Claritin. On exam pt has rhonchi all throughout lung field , chest xray shows atelectasis with mild Infiltrate.  Neuropathy due to type 2 diabetes mellitus (HCC) Assessment & Plan Cont Cymbalta and gabapentin.   Diabetes mellitus type 2, noninsulin dependent ([place]) Assessment & Plan Glycemic protocol, SSI coverage. Home regimen with metformin held .   Vascular dementia with behavior disturbance ([place]) Assessment & Plan Cont pt on her donepezil and cymbalta.   Closed fracture of left hip (HCC) Assessment & Plan PRN Pain control with dilaudid. PT and sx plan per orthopedic we apprecaite management.    Acute kidney injury superimposed on CKD [facility]) Assessment & Plan Current creatinine as below: Lab Results  Component Value Date   CREATININE 1.56 (H) 04/06/2020   CREATININE 1.19 (H) 05/30/2019   CREATININE 1.11 (H) 07/16/2018  Will hold losartan and monitor for improvement. Will consider benicar at lower dose.  Avoid nephrotoxic agent contrast and renally dose meds.      Gentle IVF with ns at 20cc/hour overnight.  DVT prophylaxis:  Heparin / SCD.  Code Status:  Full Code   Family Communication:  None at bedside   Disposition Plan:  SNF   Consults called:  Orthopedic-Dr.Remie Mathison.  Admission status: Status is: Observation  The patient remains OBS appropriate and will d/c before 2 midnights.  Dispo: The patient is from: SNF              Anticipated d/c is to: SNF              Anticipated d/c date is: 2 days              Patient currently is not medically stable to d/c.   Para Skeans MD [facility] Pager [phone] If 7PM-7AM,  please contact night-coverage [email or web address] Password [facility] 04/06/2020, 6:19 PM

## 2020-04-06 NOTE — Progress Notes (Signed)
Brief Pharmacy Note  Pharmacy consult for VTE prophylaxis. Plan for orthopedic procedure most likely tomorrow. Will order heparin 5000 units SQ x 1 tonight and hold additional dosing pending plan for procedure. Will follow up appropriate VTE prophylaxis following procedure.  Dorena Bodo, PharmD

## 2020-04-06 NOTE — Assessment & Plan Note (Signed)
WE will hold losartan on account of aki on CKD and cont metoprolol. I suspect prn Dilaudid will also helped her bp to be much lower.  Cardiac diet.

## 2020-04-06 NOTE — Progress Notes (Signed)
Lab calls this nurse with positive MRSA swab.

## 2020-04-06 NOTE — ED Notes (Signed)
Report called to [place] rn all questions answered

## 2020-04-06 NOTE — Progress Notes (Signed)
Full consult note to follow tomorrow AM.  Called by ED staff. Imaging reviewed.  - Plan for surgery tomorrow for L hip percutaneous pinning, likely afternoon.  - I have discussed the patient's findings and plan of care with the patient's daughter over the phone. We are in agreement to proceed with above surgery.  - NPO after midnight - Hold anticoagulation - Admit to Hospitalist team.

## 2020-04-06 NOTE — Assessment & Plan Note (Addendum)
Current creatinine as below: Lab Results  Component Value Date   CREATININE 1.56 (H) 04/06/2020   CREATININE 1.19 (H) 05/30/2019   CREATININE 1.11 (H) 07/16/2018  Will hold losartan and monitor for improvement. Will consider benicar at lower dose.  Avoid nephrotoxic agent contrast and renally dose meds.

## 2020-04-06 NOTE — ED Notes (Signed)
Transport put in for waiting for transporter, daughter updated on patient status all questions answered , daughters name in [place]

## 2020-04-06 NOTE — ED Notes (Signed)
Report called to [place] rn all questions answered

## 2020-04-06 NOTE — ED Provider Notes (Signed)
[facility] Emergency Department Provider Note  ____________________________________________   First MD Initiated Contact with Patient 04/06/20 1639     (approximate)  I have reviewed the triage vital signs and the nursing notes.   HISTORY  Chief Complaint Hip Problem (per ems, pt has right hip fracture confirmed today  , per facility pt fell last week , also pt sob and hypotensive started today )   HPI Chelsea [name] is a 86 y.o. female with a past medical history of chronic approximate respiratory failure on 3 L at baseline secondary to COPD, asthma, anemia, DM, GERD, HTN, neuropathy, osteoarthritis, and tremor who presents via EMS from a nursing facility with concerns for persistent left hip pain after a fall 1 week ago.  Patient is not oriented and unable provide any clear history on when she fell or how.  She endorses some pain in her left hip but denies any other pain including in her head, chest, abdomen, back, bilateral upper extremities, or right lower extremity.  She is not oriented to date and provide any additional history.  Was able to reach a staff member at her facility states the patient is typically oriented and was complaining of pain after a fall 1 week ago but an x-ray obtained at that time showed no fracture repeat x-ray obtained today did show concerns for left hip fracture.  Patient has not had any subsequent injuries or falls.  Per staff patient has not been complaining of any fevers, chills, cough, vomiting, diarrhea, dysuria, or any other acute complaints.  I was able to speak with the patient's daughter who states the patient does have dementia and is typically not oriented.         Past Medical History:  Diagnosis Date  . Anemia   . Chronic obstructive asthma ([place])   . COPD (chronic obstructive pulmonary disease) ([place])   . Depression   . Diabetes mellitus without complication ([place])   . Femoral bruit   . GERD without esophagitis   .  Hypertension   . Hypertension   . Lumbago   . Neuropathy   . Osteoarthritis   . Psoriasis   . Tremor   . Ulcerative colitis ([place])   . Vertigo     Patient Active Problem List   Diagnosis Date Noted  . Closed fracture of left hip ([place]) 04/06/2020  . Closed left hip fracture ([place]) 04/06/2020  . Blood in stool   . Acute peptic ulcer of stomach   . Melena 07/15/2018  . GIB (gastrointestinal bleeding) 07/15/2018  . External hemorrhoids 07/09/2018  . Neuropathy due to type 2 diabetes mellitus ([place]) 06/29/2018  . Felon of finger of right hand 06/29/2018  . Vascular dementia with behavior disturbance ([place]) 05/15/2018  . Agitation 05/15/2018  . Essential hypertension, benign 03/02/2018  . Peripheral neuropathy 03/02/2018  . Chronic arthritis 03/02/2018  . Depression with anxiety 03/02/2018  . Chronic constipation 03/02/2018  . Chronic obstructive pulmonary disease (COPD) ([place]) 05/21/2016  . Diabetes mellitus type 2, noninsulin dependent ([place]) 05/21/2016  . Physical deconditioning 05/21/2016    Past Surgical History:  Procedure Laterality Date  . ABDOMINAL HYSTERECTOMY    . APPENDECTOMY    . ESOPHAGOGASTRODUODENOSCOPY (EGD) WITH PROPOFOL N/A 07/16/2018   Procedure: ESOPHAGOGASTRODUODENOSCOPY (EGD) WITH PROPOFOL;  Surgeon: [name], MD;  Location: [facility] ENDOSCOPY;  Service: Endoscopy;  Laterality: N/A;  . EYE SURGERY      Prior to Admission medications   Medication Sig Start Date End Date Taking?  Authorizing Provider  acetaminophen (TYLENOL) 325 MG tablet Take 650 mg by mouth every 4 (four) hours as needed for mild pain or fever.  02/25/18   [provider]  Amino Acids-Protein Hydrolys (FEEDING SUPPLEMENT, PRO-STAT SUGAR FREE 64,) LIQD Take 30 mLs by mouth 2 (two) times daily.    [provider]  diphenhydrAMINE (BENADRYL) 25 mg capsule Take 25 mg by mouth every 6 (six) hours as needed.    [provider]  donepezil (ARICEPT) 10 MG tablet Take 10 mg by  mouth at bedtime.    [provider]  DULoxetine (CYMBALTA) 60 MG capsule Take 120 mg by mouth daily.  06/24/18   [provider]  ferrous sulfate 325 (65 FE) MG EC tablet Take 1 tablet (325 mg total) by mouth 2 (two) times daily with a meal for 30 days. 07/22/18 05/30/19  Hillary Bow, MD  Fluticasone-Salmeterol (ADVAIR) 250-50 MCG/DOSE AEPB Inhale 2 puffs into the lungs 2 (two) times daily.     [provider]  gabapentin (NEURONTIN) 300 MG capsule Take 300 mg by mouth 4 (four) times daily.    [provider]  guaifenesin (ROBITUSSIN) 100 MG/5ML syrup Take 200 mg by mouth every 6 (six) hours as needed for cough. 04/04/18   [provider]  lamoTRIgine (LAMICTAL) 25 MG tablet Take 25 mg by mouth 2 (two) times daily.  05/29/18   [provider]  loratadine (CLARITIN) 10 MG tablet Take 10 mg by mouth daily.     [provider]  losartan (COZAAR) 50 MG tablet Take 1 tablet (50 mg total) by mouth daily. 07/19/18   Gladstone Lighter, MD  magnesium hydroxide (MILK OF MAGNESIA) 400 MG/5ML suspension Take 30 mLs by mouth daily as needed for mild constipation. 07/19/18   Gladstone Lighter, MD  magnesium oxide (MAG-OX) 400 MG tablet Take 800 mg by mouth 2 (two) times daily.    [provider]  Melatonin 3 MG TABS Take 1 tablet by mouth at bedtime. 05/13/18   [provider]  metFORMIN (GLUCOPHAGE-XR) 750 MG 24 hr tablet Take 750 mg by mouth 2 (two) times daily.     [provider]  metoprolol tartrate (LOPRESSOR) 25 MG tablet Take 25 mg by mouth 2 (two) times daily.    [provider]  mirtazapine (REMERON) 7.5 MG tablet Take 7.5 mg by mouth at bedtime. 04/05/18   [provider]  Multiple Vitamins-Minerals (MULTIVITAMIN WITH MINERALS) tablet Take 1 tablet by mouth daily.    [provider]  NON FORMULARY Diet Type: Regular    [provider]  OXYGEN Inhale 2 L/min into the lungs at  bedtime. 07/04/18   [provider]  pantoprazole (PROTONIX) 40 MG tablet Take 40 mg by mouth daily.    [provider]  polyethylene glycol (MIRALAX / GLYCOLAX) 17 g packet Take 17 g by mouth daily.    [provider]    Allergies Lyrica [pregabalin]  Family History  Problem Relation Age of Onset  . Diabetes Mother   . Liver cancer Father     Social History Social History   Tobacco Use  . Smoking status: Former Smoker    Types: Cigarettes  . Smokeless tobacco: Never Used  Substance Use Topics  . Alcohol use: No  . Drug use: No    Review of Systems  Review of Systems  Unable to perform ROS: Dementia      ____________________________________________   PHYSICAL EXAM:  VITAL SIGNS: ED Triage  Vitals  Enc Vitals Group     BP      Pulse      Resp      Temp      Temp src      SpO2      Weight      Height      Head Circumference      [place]      Pain Score      Pain Loc      Pain Edu?      Excl. in [place]?    Vitals:   04/06/20 1640 04/06/20 1641  BP: (!) 125/101   Pulse: 75   Resp:  17  Temp: 98.5 F (36.9 C)   SpO2: 95%    Physical Exam Vitals and nursing note reviewed.  Constitutional:      Appearance: Normal appearance. She is normal weight.  HENT:     Head: Normocephalic and atraumatic.     Right Ear: External ear normal.     Left Ear: External ear normal.     Nose: Nose normal.     Mouth/Throat:     Mouth: Mucous membranes are dry.  Eyes:     Extraocular Movements: Extraocular movements intact.     Conjunctiva/sclera: Conjunctivae normal.     Pupils: Pupils are equal, round, and reactive to light.  Cardiovascular:     Rate and Rhythm: Normal rate and regular rhythm.  Pulmonary:     Effort: Pulmonary effort is normal. No respiratory distress.     Breath sounds: No stridor. No wheezing or rhonchi.  Abdominal:     General: There is no distension.     Tenderness: There is no abdominal tenderness.   Musculoskeletal:     Cervical back: No rigidity.  Skin:    General: Skin is warm.     Capillary Refill: Capillary refill takes 2 to 3 seconds.  Neurological:     Mental Status: She is alert. Mental status is at baseline. She is disoriented and confused.     Patient has decreased range of the left hip.  She will not lift her left leg off the bed on command.  She does lift her right leg off and on command.  She has full strength on her bilateral upper extremities.  PERRLA.  EOMI.  There is no tenderness step-offs or deformities over the C/T/L-spine.  Sensation is intact to light touch in all extremities.  2+ bilateral radial and DP pulses. ____________________________________________   LABS (all labs ordered are listed, but only abnormal results are displayed)  Labs Reviewed  CBC WITH DIFFERENTIAL/PLATELET - Abnormal; Notable for the following components:      Result Value   RBC 3.73 (*)    Hemoglobin 11.5 (*)    HCT 35.2 (*)    All other components within normal limits  COMPREHENSIVE METABOLIC PANEL - Abnormal; Notable for the following components:   Potassium 5.2 (*)    Glucose, Bld 118 (*)    Creatinine, Ser 1.56 (*)    Albumin 3.4 (*)    Total Bilirubin 1.3 (*)    GFR, Estimated 32 (*)    All other components within normal limits  URINALYSIS, COMPLETE (UACMP) WITH MICROSCOPIC - Abnormal; Notable for the following components:   Color, Urine YELLOW (*)    APPearance HAZY (*)    Protein, ur 100 (*)    Bacteria, UA RARE (*)    All other components within normal limits  RESPIRATORY PANEL BY RT  PCR (FLU A&B, COVID)  TSH  AMMONIA  BRAIN NATRIURETIC PEPTIDE  PROTIME-INR  APTT  HEMOGLOBIN A1C   ____________________________________________  EKG  Sinus rhythm with ventricular rate of 72, Q waves in lead III and aVF without other clear evidence of acute ischemia or other significant Arrhythmia. ____________________________________________  RADIOLOGY  ED MD  interpretation: No clear evidence of pneumonia, significant effusion, overt edema, or pneumothorax.  Patient has a small left pleural effusion and some atherosclerosis.  Mildly displaced left proximal femur fracture.  Official radiology report(s): DG Chest 1 View  Result Date: 04/06/2020 CLINICAL DATA:  Fall. EXAM: CHEST  1 VIEW COMPARISON:  July 15, 2018. FINDINGS: The heart size and mediastinal contours are within normal limits. No pneumothorax is noted. Right lung is clear. Mild left basilar subsegmental atelectasis or infiltrate is noted laterally with possible small effusion. The visualized skeletal structures are unremarkable. IMPRESSION: Mild left basilar subsegmental atelectasis or infiltrate is noted laterally with possible small left pleural effusion. Aortic Atherosclerosis (ICD10-I70.0). Electronically Signed   By: Marijo Conception M.D.   On: 04/06/2020 17:36   DG Hip Unilat W or Wo Pelvis 2-3 Views Left  Result Date: 04/06/2020 CLINICAL DATA:  Left hip pain after fall. EXAM: DG HIP (WITH OR WITHOUT PELVIS) 2-3V LEFT COMPARISON:  None. FINDINGS: Mildly displaced subcapital proximal left femoral fracture is noted. Right hip is unremarkable. IMPRESSION: Mildly displaced subcapital proximal left femoral fracture. Electronically Signed   By: Marijo Conception M.D.   On: 04/06/2020 17:35    ____________________________________________   PROCEDURES  Procedure(s) performed (including Critical Care):  .1-3 Lead EKG Interpretation Performed by: Lucrezia Starch, MD Authorized by: Lucrezia Starch, MD     Interpretation: normal     ECG rate assessment: normal     Rhythm: sinus rhythm     Ectopy: none     Conduction: normal       ____________________________________________   INITIAL IMPRESSION / ASSESSMENT AND PLAN / ED COURSE        Patient presents with [place] to history exam via EMS from nursing facility with concern for left hip fracture after patient fell yesterday and was  complaining of persistent pain and x-ray obtained at the facility was concerning for hip fracture.  Patient is afebrile and hemodynamically stable arrival.  She is on her home 3 L.  Exam as above remarkable for decreased strength range of motion of the left hip.  Patient is otherwise neurovascular intact in all extremities.  No other obvious of trauma to the bilateral upper or lower extremities.  I am unable to review plain film obtained at patient's nursing facility.  Plain film the left hip ordered.  Also obtain routine labs.  Patient has no other findings suggestive of injury on exam in the bilateral extremities, right lower extremity, head, neck, chest abdomen or back.  Low suspicion for occult visceral injury.  CBC remarkable for hemoglobin of 11.5 which is elevated compared to 10 months ago of 9.4.  Otherwise unremarkable.  CMP remarkable for very slight hyperkalemia with a K of 5.2 with evidence of an AKI with a creatinine of 1.56 when compared to 10 months ago of 1.19.  No other significant electrolyte or metabolic derangements.  I did discuss patient's above-noted left hip fracture with on-call orthopedist Dr. Posey Pronto who recommended obtaining a CT of the patient's left hip and admitted to the hospital service with plan for surgery tomorrow.  Patient was also given IV fluids with concern for  AKI related to mild dehydration.  No clear foci of infection on exam suspicion for other acute infectious process given absence of fever or elevated white blood cell count.  However plan to also obtain a UA to assess for any evidence of urine tract infection that could have contributed to patient's fall.  Patient is at her neurological baseline and is not anticoagulated and denies any headache or striking her head when she fell.  Plan to admit to hospital service for further evaluation management.  ____________________________________________   FINAL CLINICAL IMPRESSION(S) / ED DIAGNOSES  Final  diagnoses:  Closed fracture of left hip, initial encounter ([place])  AKI (acute kidney injury) ([place])  Dementia without behavioral disturbance, unspecified dementia type (HCC)    Medications  lactated ringers bolus 1,000 mL (has no administration in time range)  feeding supplement (PRO-STAT SUGAR FREE 64) liquid 30 mL (has no administration in time range)  donepezil (ARICEPT) tablet 10 mg (has no administration in time range)  DULoxetine (CYMBALTA) DR capsule 120 mg (has no administration in time range)  fluticasone furoate-vilanterol (BREO ELLIPTA) 200-25 MCG/INH 1 puff (has no administration in time range)  gabapentin (NEURONTIN) capsule 300 mg (has no administration in time range)  lamoTRIgine (LAMICTAL) tablet 25 mg (has no administration in time range)  loratadine (CLARITIN) tablet 10 mg (has no administration in time range)  losartan (COZAAR) tablet 50 mg (has no administration in time range)  metoprolol tartrate (LOPRESSOR) tablet 25 mg (has no administration in time range)  polyethylene glycol (MIRALAX / GLYCOLAX) packet 17 g (has no administration in time range)  pantoprazole (PROTONIX) EC tablet 40 mg (has no administration in time range)  mirtazapine (REMERON) tablet 7.5 mg (has no administration in time range)  HYDROcodone-acetaminophen (NORCO/VICODIN) 5-325 MG per tablet 1-2 tablet (has no administration in time range)  morphine 2 MG/ML injection 0.5 mg (has no administration in time range)  insulin aspart (novoLOG) injection 0-15 Units (has no administration in time range)  HYDROmorphone (DILAUDID) injection 1 mg (has no administration in time range)     ED Discharge Orders    None       Note:  This document was prepared using Dragon voice recognition software and may include unintentional dictation errors.   Lucrezia Starch, MD 04/06/20 [phone]

## 2020-04-06 NOTE — Assessment & Plan Note (Signed)
Cont pt on her Advair and Claritin. On exam pt has rhonchi all throughout lung field , chest xray shows atelectasis with mild Infiltrate.

## 2020-04-06 NOTE — Assessment & Plan Note (Signed)
Glycemic protocol. Home regimen with metformin held .

## 2020-04-06 NOTE — TOC Initial Note (Signed)
Transition of Care [facility]) - Initial/Assessment Note    Patient Details  Name: Chelsea [name] MRN: [id number] Date of Birth: February 08, 1931  [facility]) CM/SW Contact:    Iona Beard, [place] Phone Number: 04/06/2020, 7:01 PM  Clinical Narrative:                 Pt admitted for closed fracture of left hip. Due to pt not being oriented, CSW spoke with Lou, [place] (Daughter)  [phone]. Ms. Melman confirmed that pt is long term resident at [facility]. Ms. Cendejas states that the plan is for pt to return to [facility] at d/c. CSW informed Ms. Pieper that [facility] team may follow up tomorrow 11/3 for more information regarding pt history. TOC to follow.   Expected Discharge Plan: Long Term Nursing Home Barriers to Discharge: Continued Medical Work up   Patient Goals and CMS Choice Patient states their goals for this hospitalization and ongoing recovery are:: Return to [facility]   Choice offered to / list presented to : NA  Expected Discharge Plan and Services Expected Discharge Plan: Long Term Nursing Home In-house Referral: Clinical Social Work Discharge Planning Services: NA Post Acute Care Choice: Nursing Home Living arrangements for the past 2 months:  ([facility])                                      Prior Living Arrangements/Services Living arrangements for the past 2 months:  ([facility]) Lives with:: Self          Need for Family Participation in Patient Care: Yes (Comment) (Pt is not oriented. Can speak with Blanca, [place] (Daughter) [phone])        Activities of Daily Living      Permission Sought/Granted                  Emotional Assessment   Attitude/Demeanor/Rapport: Unable to Assess Affect (typically observed): Unable to Assess Orientation: : Fluctuating Orientation (Suspected and/or reported [place]) Alcohol / Substance Use: Not Applicable Psych Involvement: No (comment)  Admission diagnosis:   Closed left hip fracture ([place]) [S72.002A] Patient Active Problem List   Diagnosis Date Noted  . Closed fracture of left hip ([place]) 04/06/2020  . Closed left hip fracture ([place]) 04/06/2020  . Acute kidney injury superimposed on CKD ([place]) 04/06/2020  . Blood in stool   . Acute peptic ulcer of stomach   . Melena 07/15/2018  . GIB (gastrointestinal bleeding) 07/15/2018  . External hemorrhoids 07/09/2018  . Neuropathy due to type 2 diabetes mellitus ([place]) 06/29/2018  . Felon of finger of right hand 06/29/2018  . Vascular dementia with behavior disturbance ([place]) 05/15/2018  . Agitation 05/15/2018  . Essential hypertension, benign 03/02/2018  . Peripheral neuropathy 03/02/2018  . Chronic arthritis 03/02/2018  . Depression with anxiety 03/02/2018  . Chronic constipation 03/02/2018  . Chronic obstructive pulmonary disease (COPD) ([place]) 05/21/2016  . Diabetes mellitus type 2, noninsulin dependent ([place]) 05/21/2016  . Physical deconditioning 05/21/2016   PCP:  Merryl Hacker No Pharmacy:   [facility] [address], [place] - [place] [address] Phone: [phone] Fax: [phone]     Social Determinants of Health (SDOH) Interventions    Readmission Risk Interventions No flowsheet data found.

## 2020-04-06 NOTE — Assessment & Plan Note (Signed)
Cont pt on her donepezil and cymbalta.

## 2020-04-06 NOTE — Assessment & Plan Note (Signed)
Cont Cymbalta and gabapentin.

## 2020-04-06 NOTE — ED Triage Notes (Signed)
per ems, pt has right hip fracture confirmed today  , per facility pt fell last week , also pt sob and hypotensive started today, pt has htn upon arrival and oxygen saturation 95%RA

## 2020-04-07 ENCOUNTER — Inpatient Hospital Stay: Payer: Medicare Other | Admitting: Anesthesiology

## 2020-04-07 ENCOUNTER — Encounter
Admission: EM | Disposition: A | Discharge status: Skilled Nursing Facility | Payer: Self-pay | Source: Skilled Nursing Facility | Attending: Internal Medicine

## 2020-04-07 ENCOUNTER — Inpatient Hospital Stay: Payer: Medicare Other

## 2020-04-07 ENCOUNTER — Encounter: Payer: Self-pay | Admitting: Internal Medicine

## 2020-04-07 DIAGNOSIS — J449 Chronic obstructive pulmonary disease, unspecified: Secondary | ICD-10-CM

## 2020-04-07 DIAGNOSIS — S72002A Fracture of unspecified part of neck of left femur, initial encounter for closed fracture: Secondary | ICD-10-CM | POA: Diagnosis not present

## 2020-04-07 DIAGNOSIS — Z20822 Contact with and (suspected) exposure to covid-19: Secondary | ICD-10-CM | POA: Diagnosis present

## 2020-04-07 DIAGNOSIS — R296 Repeated falls: Secondary | ICD-10-CM | POA: Diagnosis present

## 2020-04-07 DIAGNOSIS — N189 Chronic kidney disease, unspecified: Secondary | ICD-10-CM | POA: Diagnosis present

## 2020-04-07 DIAGNOSIS — Z7951 Long term (current) use of inhaled steroids: Secondary | ICD-10-CM | POA: Diagnosis not present

## 2020-04-07 DIAGNOSIS — N179 Acute kidney failure, unspecified: Secondary | ICD-10-CM | POA: Diagnosis not present

## 2020-04-07 DIAGNOSIS — Z79899 Other long term (current) drug therapy: Secondary | ICD-10-CM | POA: Diagnosis not present

## 2020-04-07 DIAGNOSIS — F0151 Vascular dementia with behavioral disturbance: Secondary | ICD-10-CM | POA: Diagnosis present

## 2020-04-07 DIAGNOSIS — E1122 Type 2 diabetes mellitus with diabetic chronic kidney disease: Secondary | ICD-10-CM | POA: Diagnosis present

## 2020-04-07 DIAGNOSIS — K219 Gastro-esophageal reflux disease without esophagitis: Secondary | ICD-10-CM | POA: Diagnosis present

## 2020-04-07 DIAGNOSIS — F0390 Unspecified dementia without behavioral disturbance: Secondary | ICD-10-CM | POA: Diagnosis not present

## 2020-04-07 DIAGNOSIS — E1142 Type 2 diabetes mellitus with diabetic polyneuropathy: Secondary | ICD-10-CM | POA: Diagnosis present

## 2020-04-07 DIAGNOSIS — I129 Hypertensive chronic kidney disease with stage 1 through stage 4 chronic kidney disease, or unspecified chronic kidney disease: Secondary | ICD-10-CM | POA: Diagnosis present

## 2020-04-07 DIAGNOSIS — Y92128 Other place in nursing home as the place of occurrence of the external cause: Secondary | ICD-10-CM | POA: Diagnosis not present

## 2020-04-07 DIAGNOSIS — S72012A Unspecified intracapsular fracture of left femur, initial encounter for closed fracture: Secondary | ICD-10-CM | POA: Diagnosis present

## 2020-04-07 DIAGNOSIS — E875 Hyperkalemia: Secondary | ICD-10-CM | POA: Diagnosis present

## 2020-04-07 DIAGNOSIS — K5909 Other constipation: Secondary | ICD-10-CM | POA: Diagnosis present

## 2020-04-07 DIAGNOSIS — F32A Depression, unspecified: Secondary | ICD-10-CM | POA: Diagnosis present

## 2020-04-07 DIAGNOSIS — M1990 Unspecified osteoarthritis, unspecified site: Secondary | ICD-10-CM | POA: Diagnosis present

## 2020-04-07 DIAGNOSIS — E860 Dehydration: Secondary | ICD-10-CM | POA: Diagnosis present

## 2020-04-07 DIAGNOSIS — F418 Other specified anxiety disorders: Secondary | ICD-10-CM | POA: Diagnosis present

## 2020-04-07 DIAGNOSIS — M25552 Pain in left hip: Secondary | ICD-10-CM | POA: Diagnosis present

## 2020-04-07 DIAGNOSIS — Z9981 Dependence on supplemental oxygen: Secondary | ICD-10-CM | POA: Diagnosis not present

## 2020-04-07 DIAGNOSIS — Z66 Do not resuscitate: Secondary | ICD-10-CM | POA: Diagnosis present

## 2020-04-07 DIAGNOSIS — W050XXA Fall from non-moving wheelchair, initial encounter: Secondary | ICD-10-CM | POA: Diagnosis present

## 2020-04-07 DIAGNOSIS — J9610 Chronic respiratory failure, unspecified whether with hypoxia or hypercapnia: Secondary | ICD-10-CM | POA: Diagnosis present

## 2020-04-07 DIAGNOSIS — Z7984 Long term (current) use of oral hypoglycemic drugs: Secondary | ICD-10-CM | POA: Diagnosis not present

## 2020-04-07 DIAGNOSIS — L409 Psoriasis, unspecified: Secondary | ICD-10-CM | POA: Diagnosis present

## 2020-04-07 HISTORY — PX: HIP PINNING,CANNULATED: SHX1758

## 2020-04-07 LAB — TYPE AND SCREEN
ABO/RH(D): O POS
Antibody Screen: NEGATIVE

## 2020-04-07 LAB — GLUCOSE, CAPILLARY
Glucose-Capillary: 130 mg/dL — ABNORMAL HIGH (ref 70–99)
Glucose-Capillary: 105 mg/dL — ABNORMAL HIGH (ref 70–99)
Glucose-Capillary: 96 mg/dL (ref 70–99)
Glucose-Capillary: 88 mg/dL (ref 70–99)
Glucose-Capillary: 122 mg/dL — ABNORMAL HIGH (ref 70–99)

## 2020-04-07 SURGERY — FIXATION, FEMUR, NECK, PERCUTANEOUS, USING SCREW
Anesthesia: Spinal | Site: Hip | Laterality: Left

## 2020-04-07 SURGERY — HEMIARTHROPLASTY, HIP, DIRECT ANTERIOR APPROACH, FOR FRACTURE
Anesthesia: Choice | Site: Hip | Laterality: Left

## 2020-04-07 MED ORDER — ACETAMINOPHEN 10 MG/ML IV SOLN
INTRAVENOUS | Status: AC
  Filled 2020-04-07: qty 100

## 2020-04-07 MED ORDER — BISACODYL 10 MG RE SUPP
10.0000 mg | Freq: Every day | RECTAL | Status: DC | PRN
  Filled 2020-04-07: qty 1

## 2020-04-07 MED ORDER — BUPIVACAINE HCL (PF) 0.5 % IJ SOLN
INTRAMUSCULAR | Status: DC | PRN
  Administered 2020-04-07: 2.6 mL

## 2020-04-07 MED ORDER — DOCUSATE SODIUM 100 MG PO CAPS
100.0000 mg | ORAL_CAPSULE | Freq: Two times a day (BID) | ORAL | Status: DC
  Administered 2020-04-07 – 2020-04-09 (×4): 100 mg via ORAL
  Filled 2020-04-07 (×4): qty 1

## 2020-04-07 MED ORDER — CHLORHEXIDINE GLUCONATE CLOTH 2 % EX PADS
6.0000 | MEDICATED_PAD | Freq: Every day | CUTANEOUS | Status: DC
  Administered 2020-04-08: 6 via TOPICAL

## 2020-04-07 MED ORDER — ADULT MULTIVITAMIN W/MINERALS CH: 1.0000 | ORAL_TABLET | Freq: Every day | ORAL | Status: DC

## 2020-04-07 MED ORDER — CEFAZOLIN SODIUM-DEXTROSE 2-4 GM/100ML-% IV SOLN
2.0000 g | Freq: Four times a day (QID) | INTRAVENOUS | Status: AC
  Administered 2020-04-07 – 2020-04-08 (×3): 2 g via INTRAVENOUS
  Filled 2020-04-07 (×3): qty 100

## 2020-04-07 MED ORDER — DEXMEDETOMIDINE (PRECEDEX) IN NS 20 MCG/5ML (4 MCG/ML) IV SYRINGE
PREFILLED_SYRINGE | INTRAVENOUS | Status: DC | PRN
  Administered 2020-04-07 (×3): 4 ug via INTRAVENOUS

## 2020-04-07 MED ORDER — METOCLOPRAMIDE HCL 5 MG/ML IJ SOLN: 5.0000 mg | Freq: Three times a day (TID) | INTRAMUSCULAR | Status: DC | PRN

## 2020-04-07 MED ORDER — ACETAMINOPHEN 325 MG PO TABS: 650.0000 mg | ORAL_TABLET | Freq: Four times a day (QID) | ORAL | Status: DC | PRN

## 2020-04-07 MED ORDER — ACETAMINOPHEN 500 MG PO TABS
1000.0000 mg | ORAL_TABLET | Freq: Three times a day (TID) | ORAL | Status: AC
  Administered 2020-04-07 – 2020-04-08 (×3): 1000 mg via ORAL
  Filled 2020-04-07 (×3): qty 2

## 2020-04-07 MED ORDER — HYDROMORPHONE HCL 1 MG/ML IJ SOLN
0.2500 mg | INTRAMUSCULAR | Status: DC | PRN
  Administered 2020-04-07: 0.5 mg via INTRAVENOUS
  Filled 2020-04-07: qty 1

## 2020-04-07 MED ORDER — PROPOFOL 500 MG/50ML IV EMUL
INTRAVENOUS | Status: AC
  Filled 2020-04-07: qty 50

## 2020-04-07 MED ORDER — ONDANSETRON HCL 4 MG/2ML IJ SOLN
INTRAMUSCULAR | Status: DC | PRN
  Administered 2020-04-07: 4 mg via INTRAVENOUS

## 2020-04-07 MED ORDER — SODIUM CHLORIDE 0.9 % IV SOLN: INTRAVENOUS | Status: DC | PRN

## 2020-04-07 MED ORDER — ONDANSETRON HCL 4 MG/2ML IJ SOLN: 4.0000 mg | Freq: Four times a day (QID) | INTRAMUSCULAR | Status: DC | PRN

## 2020-04-07 MED ORDER — SENNOSIDES-DOCUSATE SODIUM 8.6-50 MG PO TABS: 1.0000 | ORAL_TABLET | Freq: Every evening | ORAL | Status: DC | PRN

## 2020-04-07 MED ORDER — FLEET ENEMA 7-19 GM/118ML RE ENEM: 1.0000 | ENEMA | Freq: Once | RECTAL | Status: DC | PRN

## 2020-04-07 MED ORDER — ENOXAPARIN SODIUM 30 MG/0.3ML ~~LOC~~ SOLN
30.0000 mg | SUBCUTANEOUS | Status: DC
  Administered 2020-04-08: 30 mg via SUBCUTANEOUS
  Filled 2020-04-07: qty 0.3

## 2020-04-07 MED ORDER — TRAMADOL HCL 50 MG PO TABS
50.0000 mg | ORAL_TABLET | Freq: Four times a day (QID) | ORAL | Status: DC | PRN
  Administered 2020-04-07 – 2020-04-09 (×3): 50 mg via ORAL
  Filled 2020-04-07 (×3): qty 1

## 2020-04-07 MED ORDER — SODIUM CHLORIDE 0.9 % IV SOLN: INTRAVENOUS | Status: DC

## 2020-04-07 MED ORDER — METHOCARBAMOL 500 MG PO TABS
500.0000 mg | ORAL_TABLET | Freq: Four times a day (QID) | ORAL | Status: DC | PRN
  Administered 2020-04-07 – 2020-04-09 (×2): 500 mg via ORAL
  Filled 2020-04-07 (×2): qty 1

## 2020-04-07 MED ORDER — BUPIVACAINE LIPOSOME 1.3 % IJ SUSP
INTRAMUSCULAR | Status: DC | PRN
  Administered 2020-04-07: 20 mL

## 2020-04-07 MED ORDER — ENOXAPARIN SODIUM 40 MG/0.4ML ~~LOC~~ SOLN: 40.0000 mg | SUBCUTANEOUS | Status: DC

## 2020-04-07 MED ORDER — SODIUM CHLORIDE 0.9 % IV SOLN: Freq: Once | INTRAVENOUS | Status: AC

## 2020-04-07 MED ORDER — LIDOCAINE HCL (PF) 2 % IJ SOLN
INTRAMUSCULAR | Status: AC
  Filled 2020-04-07: qty 5

## 2020-04-07 MED ORDER — GABAPENTIN 300 MG PO CAPS: 300.0000 mg | ORAL_CAPSULE | Freq: Two times a day (BID) | ORAL | Status: DC

## 2020-04-07 MED ORDER — OXYCODONE HCL 5 MG PO TABS: 2.5000 mg | ORAL_TABLET | ORAL | Status: DC | PRN

## 2020-04-07 MED ORDER — ONDANSETRON HCL 4 MG/2ML IJ SOLN
INTRAMUSCULAR | Status: AC
  Filled 2020-04-07: qty 2

## 2020-04-07 MED ORDER — BUPIVACAINE HCL (PF) 0.5 % IJ SOLN
INTRAMUSCULAR | Status: DC | PRN
  Administered 2020-04-07: 10 mL

## 2020-04-07 MED ORDER — GLYCOPYRROLATE 0.2 MG/ML IJ SOLN
INTRAMUSCULAR | Status: AC
  Filled 2020-04-07: qty 1

## 2020-04-07 MED ORDER — METOCLOPRAMIDE HCL 10 MG PO TABS: 5.0000 mg | ORAL_TABLET | Freq: Three times a day (TID) | ORAL | Status: DC | PRN

## 2020-04-07 MED ORDER — ONDANSETRON HCL 4 MG PO TABS: 4.0000 mg | ORAL_TABLET | Freq: Four times a day (QID) | ORAL | Status: DC | PRN

## 2020-04-07 MED ORDER — ACETAMINOPHEN 10 MG/ML IV SOLN
INTRAVENOUS | Status: DC | PRN
  Administered 2020-04-07: 1000 mg via INTRAVENOUS

## 2020-04-07 MED ORDER — METHOCARBAMOL 1000 MG/10ML IJ SOLN
500.0000 mg | Freq: Four times a day (QID) | INTRAVENOUS | Status: DC | PRN
  Filled 2020-04-07: qty 5

## 2020-04-07 MED ORDER — OXYCODONE HCL 5 MG PO TABS
5.0000 mg | ORAL_TABLET | ORAL | Status: DC | PRN
  Administered 2020-04-07 – 2020-04-08 (×2): 10 mg via ORAL
  Administered 2020-04-09: 5 mg via ORAL
  Filled 2020-04-07 (×3): qty 2

## 2020-04-07 MED ORDER — PROPOFOL 500 MG/50ML IV EMUL
INTRAVENOUS | Status: DC | PRN
  Administered 2020-04-07: 50 ug/kg/min via INTRAVENOUS

## 2020-04-07 MED ORDER — SODIUM CHLORIDE 0.9 % IV SOLN
INTRAVENOUS | Status: DC | PRN
  Administered 2020-04-07: 20 ug/min via INTRAVENOUS

## 2020-04-07 MED ORDER — CHLORHEXIDINE GLUCONATE CLOTH 2 % EX PADS
6.0000 | MEDICATED_PAD | Freq: Every day | CUTANEOUS | Status: DC
  Administered 2020-04-07: 6 via TOPICAL

## 2020-04-07 MED ORDER — MUPIROCIN 2 % EX OINT
1.0000 "application " | TOPICAL_OINTMENT | Freq: Two times a day (BID) | CUTANEOUS | Status: DC
  Administered 2020-04-07 – 2020-04-09 (×6): 1 via NASAL
  Filled 2020-04-07: qty 22

## 2020-04-07 MED ORDER — PHENYLEPHRINE HCL (PRESSORS) 10 MG/ML IV SOLN
INTRAVENOUS | Status: DC | PRN
  Administered 2020-04-07 (×3): 50 ug via INTRAVENOUS
  Administered 2020-04-07: 100 ug via INTRAVENOUS

## 2020-04-07 MED ORDER — ENSURE ENLIVE PO LIQD
237.0000 mL | Freq: Two times a day (BID) | ORAL | Status: DC
  Filled 2020-04-07 (×2): qty 237

## 2020-04-07 MED ORDER — PROPOFOL 10 MG/ML IV BOLUS
INTRAVENOUS | Status: DC | PRN
  Administered 2020-04-07 (×2): 20 mg via INTRAVENOUS
  Administered 2020-04-07: 10 mg via INTRAVENOUS
  Administered 2020-04-07: 20 mg via INTRAVENOUS

## 2020-04-07 SURGICAL SUPPLY — 64 items
BLADE SAW SGTL 13X75X1.27 (BLADE) ×2 IMPLANT
BLADE SURG SZ10 CARB STEEL (BLADE) ×2 IMPLANT
BNDG COHESIVE 4X5 TAN STRL (GAUZE/BANDAGES/DRESSINGS) ×2 IMPLANT
CANISTER SUCT 1200ML W/VALVE (MISCELLANEOUS) ×2 IMPLANT
CANISTER SUCT 3000ML PPV (MISCELLANEOUS) ×4 IMPLANT
CHLORAPREP W/TINT 26 (MISCELLANEOUS) ×2 IMPLANT
COVER BACK TABLE REUSABLE LG (DRAPES) ×2 IMPLANT
COVER MAYO STAND STRL (DRAPES) ×2 IMPLANT
COVER WAND RF STERILE (DRAPES) ×2 IMPLANT
DERMABOND ADVANCED (GAUZE/BANDAGES/DRESSINGS) ×1
DERMABOND ADVANCED .7 DNX12 (GAUZE/BANDAGES/DRESSINGS) ×1 IMPLANT
DRAPE 3/4 80X56 (DRAPES) ×6 IMPLANT
DRAPE INCISE IOBAN 66X60 STRL (DRAPES) ×2 IMPLANT
DRAPE SPLIT 6X30 W/TAPE (DRAPES) ×2 IMPLANT
DRAPE SURG 17X11 SM STRL (DRAPES) ×2 IMPLANT
DRAPE U-SHAPE 47X51 STRL (DRAPES) ×2 IMPLANT
DRSG OPSITE POSTOP 4X10 (GAUZE/BANDAGES/DRESSINGS) ×2 IMPLANT
DRSG OPSITE POSTOP 4X8 (GAUZE/BANDAGES/DRESSINGS) ×2 IMPLANT
ELECT BLADE 6.5 EXT (BLADE) ×2 IMPLANT
ELECT CAUTERY BLADE 6.4 (BLADE) ×2 IMPLANT
ELECT REM PT RETURN 9FT ADLT (ELECTROSURGICAL) ×2
ELECTRODE REM PT RTRN 9FT ADLT (ELECTROSURGICAL) ×1 IMPLANT
GAUZE SPONGE 4X4 12PLY STRL (GAUZE/BANDAGES/DRESSINGS) ×2 IMPLANT
GAUZE XEROFORM 1X8 LF (GAUZE/BANDAGES/DRESSINGS) ×2 IMPLANT
GLOVE BIOGEL PI IND STRL 8 (GLOVE) ×2 IMPLANT
GLOVE BIOGEL PI INDICATOR 8 (GLOVE) ×2
GLOVE SURG ORTHO 8.0 STRL STRW (GLOVE) ×4 IMPLANT
GOWN STRL REUS W/ TWL LRG LVL3 (GOWN DISPOSABLE) ×1 IMPLANT
GOWN STRL REUS W/ TWL XL LVL3 (GOWN DISPOSABLE) ×1 IMPLANT
GOWN STRL REUS W/TWL LRG LVL3 (GOWN DISPOSABLE) ×1
GOWN STRL REUS W/TWL XL LVL3 (GOWN DISPOSABLE) ×1
HEMOVAC 400ML (MISCELLANEOUS)
KIT DRAIN HEMOVAC JP 7FR 400ML (MISCELLANEOUS) IMPLANT
KIT TURNOVER KIT A (KITS) ×2 IMPLANT
NDL SAFETY ECLIPSE 18X1.5 (NEEDLE) ×1 IMPLANT
NEEDLE FILTER BLUNT 18X 1/2SAF (NEEDLE) ×1
NEEDLE FILTER BLUNT 18X1 1/2 (NEEDLE) ×1 IMPLANT
NEEDLE HYPO 18GX1.5 SHARP (NEEDLE) ×1
NEEDLE HYPO 22GX1.5 SAFETY (NEEDLE) ×2 IMPLANT
NEEDLE MAYO CATGUT SZ4 (NEEDLE) ×2 IMPLANT
NEPTUNE MANIFOLD (MISCELLANEOUS) ×2 IMPLANT
NS IRRIG 1000ML POUR BTL (IV SOLUTION) ×2 IMPLANT
PACK HIP PROSTHESIS (MISCELLANEOUS) ×2 IMPLANT
PENCIL SMOKE ULTRAEVAC 22 CON (MISCELLANEOUS) ×2 IMPLANT
PILLOW ABDUCTION FOAM SM (MISCELLANEOUS) ×2 IMPLANT
PULSAVAC PLUS IRRIG FAN TIP (DISPOSABLE) ×2
RETRIEVER SUT HEWSON (MISCELLANEOUS) IMPLANT
SOL .9 NS 3000ML IRR  AL (IV SOLUTION) ×1
SOL .9 NS 3000ML IRR UROMATIC (IV SOLUTION) ×1 IMPLANT
STAPLER SKIN PROX 35W (STAPLE) ×2 IMPLANT
SUT ETHIBOND #5 BRAIDED 30INL (SUTURE) ×2 IMPLANT
SUT MNCRL 4-0 (SUTURE) ×1
SUT MNCRL 4-0 27XMFL (SUTURE) ×1
SUT VIC AB 0 CT1 36 (SUTURE) ×2 IMPLANT
SUT VIC AB 2-0 CT2 27 (SUTURE) ×4 IMPLANT
SUTURE MNCRL 4-0 27XMF (SUTURE) ×1 IMPLANT
SYR 20ML LL LF (SYRINGE) ×2 IMPLANT
SYR 50ML LL SCALE MARK (SYRINGE) ×2 IMPLANT
TAPE MICROFOAM 4IN (TAPE) ×2 IMPLANT
TAPE TRANSPORE STRL 2 31045 (GAUZE/BANDAGES/DRESSINGS) ×2 IMPLANT
TIP BRUSH PULSAVAC PLUS 24.33 (MISCELLANEOUS) ×2 IMPLANT
TIP FAN IRRIG PULSAVAC PLUS (DISPOSABLE) ×1 IMPLANT
TUBE KAMVAC SUCTION (TUBING) ×2 IMPLANT
TUBE SUCT KAM VAC (TUBING) ×2 IMPLANT

## 2020-04-07 SURGICAL SUPPLY — 48 items
"PENCIL ELECTRO HAND CTR " (MISCELLANEOUS) ×1 IMPLANT
BIT DRILL 4.9 CANNULATED (BIT) ×2
BIT DRILL CANN QC 4.9 LRG (BIT) IMPLANT
BLADE SURG 15 STRL LF DISP TIS (BLADE) ×1 IMPLANT
BLADE SURG 15 STRL SS (BLADE) ×3
CANISTER SUCT 1200ML W/VALVE (MISCELLANEOUS) ×1 IMPLANT
CHLORAPREP W/TINT 26 (MISCELLANEOUS) ×3 IMPLANT
COVER WAND RF STERILE (DRAPES) ×3 IMPLANT
DRAPE 3/4 80X56 (DRAPES) ×3 IMPLANT
DRAPE SURG 17X11 SM STRL (DRAPES) ×6 IMPLANT
DRAPE U-SHAPE 47X51 STRL (DRAPES) ×6 IMPLANT
DRILL BIT CANNULATED 4.9 (BIT) ×6
DRSG OPSITE POSTOP 3X4 (GAUZE/BANDAGES/DRESSINGS) ×3 IMPLANT
ELECT REM PT RETURN 9FT ADLT (ELECTROSURGICAL) ×3
ELECTRODE REM PT RTRN 9FT ADLT (ELECTROSURGICAL) ×1 IMPLANT
GAUZE PETROLATUM 1 X8 (GAUZE/BANDAGES/DRESSINGS) ×2 IMPLANT
GAUZE XEROFORM 1X8 LF (GAUZE/BANDAGES/DRESSINGS) ×3 IMPLANT
GLOVE BIOGEL PI IND STRL 8 (GLOVE) ×1 IMPLANT
GLOVE BIOGEL PI INDICATOR 8 (GLOVE) ×2
GLOVE SURG SYN 7.5  E (GLOVE) ×6
GLOVE SURG SYN 7.5 E (GLOVE) ×2 IMPLANT
GLOVE SURG SYN 7.5 PF PI (GLOVE) ×2 IMPLANT
GOWN STRL REUS W/ TWL LRG LVL3 (GOWN DISPOSABLE) ×1 IMPLANT
GOWN STRL REUS W/ TWL XL LVL3 (GOWN DISPOSABLE) ×1 IMPLANT
GOWN STRL REUS W/TWL LRG LVL3 (GOWN DISPOSABLE) ×3
GOWN STRL REUS W/TWL XL LVL3 (GOWN DISPOSABLE) ×3
GUIDEWIRE THRD ASNIS 3.2X300 (WIRE) ×8 IMPLANT
KIT TURNOVER CYSTO (KITS) ×3 IMPLANT
MAT ABSORB  FLUID 56X50 GRAY (MISCELLANEOUS) ×6
MAT ABSORB FLUID 56X50 GRAY (MISCELLANEOUS) ×2 IMPLANT
NDL FILTER BLUNT 18X1 1/2 (NEEDLE) ×1 IMPLANT
NEEDLE FILTER BLUNT 18X 1/2SAF (NEEDLE) ×2
NEEDLE FILTER BLUNT 18X1 1/2 (NEEDLE) ×1 IMPLANT
NEEDLE HYPO 22GX1.5 SAFETY (NEEDLE) ×3 IMPLANT
NS IRRIG 500ML POUR BTL (IV SOLUTION) ×3 IMPLANT
PACK HIP COMPR (MISCELLANEOUS) ×3 IMPLANT
PENCIL ELECTRO HAND CTR (MISCELLANEOUS) ×3 IMPLANT
SCREW ASNIS 85MM (Screw) ×4 IMPLANT
SCREW ASNIS 90MM (Screw) ×2 IMPLANT
SCREW CANN 6.5X80 STRL (Screw) ×2 IMPLANT
STAPLER SKIN PROX 35W (STAPLE) ×1 IMPLANT
SUT VIC AB 0 CT1 36 (SUTURE) ×3 IMPLANT
SUT VIC AB 2-0 CT1 27 (SUTURE) ×3
SUT VIC AB 2-0 CT1 TAPERPNT 27 (SUTURE) ×1 IMPLANT
SYR 30ML LL (SYRINGE) ×3 IMPLANT
SYR 5ML LL (SYRINGE) ×3 IMPLANT
TAPE CLOTH 3X10 WHT NS LF (GAUZE/BANDAGES/DRESSINGS) ×3 IMPLANT
WASHER SCREW MATTA SS 13.0X1.5 (Washer) ×6 IMPLANT

## 2020-04-07 NOTE — Op Note (Signed)
DATE OF SURGERY: 04/07/2020  PREOPERATIVE DIAGNOSIS: Left valgus impacted femoral neck fracture  POSTOPERATIVE DIAGNOSIS: Left valgus impacted femoral neck fracture  PROCEDURE: Percutaneous pinning of Left femoral neck fracture  SURGEON: Cato Mulligan, MD  ASSISTANT: Madison Acra, PA-S  EBL: 100 cc  COMPONENTS:  [employer] 6.78m cannulated screws x 3 with washers (924m [id number] INDICATIONS: Chelsea [name] 89 y42. female who sustained a valgus impacted femoral neck fracture after a fall out of her wheelchair.  The patient has significant dementia and is nonambulatory at baseline and lives at an assisted living facility. Risks and benefits of percutaneous pinning were explained to the patient's daughter who is the power of attorney. Risks include but are not limited to bleeding, infection, injury to tissues, nerves, vessels, DVT/PE, malunion/nonunion, hardware failure, and risks of anesthesia. The family understand these risks, have completed an informed consent, and wish to proceed.   PROCEDURE:  The patient was brought into the operating room. After administering spinal anesthesia, the patient was placed in the supine position on the Hana table. The uninjured leg was extended while the injured lower extremity was placed in a neutral position with care taken to not displace the fracture during positioning. The lateral aspects of the operative hip and thigh were prepped with ChloraPrep solution before being draped sterilely. IV antibiotics were administered. A timeout was performed to verify the appropriate surgical site, patient, and procedure.   The greater trochanter was identified and an approximately 5 cm incision was made over the lateral aspect of the proximal femur. The incision was carried down through the subcutaneous tissues to expose the IT band. This was split at the proximal portion of the incision and the vastus lateralis was split in line with its fibers. The lateral  aspect of the femur was cleared of soft tissue. Under fluoroscopic guidance, a guidewire was placed along the inferior aspect of the femoral neck into the head while ensuring the start point on the lateral cortex was not below the level of the lesser trochanter. A parallel guide was used to place two additional guidepins (superior posterior and superior anterior). Position of all pins was verified fluoroscopically in both the AP and lateral views. A measuring device was used the measure appropriate screw length. The guidepins were drilled with a 3.2mm 41mll. Appropriately sized screws were advanced starting with the inferior screw, then superior posterior, then superior anterior. Screws were sequentially tightened. Hardware position and bony alignment was confirmed fluoroscopically with AP and lateral views as well as live fluoroscopy.   The wounds were irrigated thoroughly with sterile saline solution. The IT band was closed with 0-Vicryl. Deep fat sutures were also placed with 0-Vicryl. The subcutaneous tissues were closed using 2-0 Vicryl interrupted sutures. The skin was closed using staples. Sterile occlusive dressing was applied. The patient was then transferred to the recovery room in satisfactory condition after tolerating the procedure well.  POSTOPERATIVE PLAN: The patient will be WBAT on the operative extremity. Lovenox 40mg/92mx 4 weeks to start on POD#1. Ancef x 24 hours. PT/OT on POD#1.

## 2020-04-07 NOTE — Transfer of Care (Signed)
Immediate Anesthesia Transfer of Care Note  Patient: [employer]  Procedure(s) Performed: CANNULATED HIP PINNING-Left Hip Hemi (Left Hip)  Patient Location: PACU  Anesthesia Type:Spinal  Level of Consciousness: sedated  Airway & Oxygen Therapy: Patient Spontanous Breathing and Patient connected to face mask oxygen  Post-op Assessment: Report given to RN and Post -op Vital signs reviewed and stable  Post vital signs: Reviewed and stable  Last Vitals:  Vitals Value Taken Time  BP 90/42 04/07/20 1530  Temp    Pulse 70 04/07/20 1533  Resp 15 04/07/20 1533  SpO2 100 % 04/07/20 1533  Vitals shown include unvalidated device data.  Last Pain:  Vitals:   04/07/20 1315  TempSrc: Temporal  PainSc: 0-No pain      Patients Stated Pain Goal: 0 (49/17/91 5056)  Complications: No complications documented.

## 2020-04-07 NOTE — Anesthesia Procedure Notes (Signed)
Spinal  Patient location during procedure: OR Start time: 04/07/2020 2:05 PM End time: 04/07/2020 2:10 PM Staffing Performed: resident/CRNA  Anesthesiologist: Tera Mater, MD Resident/CRNA: Hedda Slade, CRNA Preanesthetic Checklist Completed: patient identified, IV checked, site marked, risks and benefits discussed, surgical consent, monitors and equipment checked, pre-op evaluation and timeout performed Spinal Block Patient position: sitting Prep: ChloraPrep Patient monitoring: heart rate, continuous pulse ox, blood pressure and cardiac monitor Approach: midline Location: L3-4 Injection technique: single-shot Needle Needle type: Whitacre and Introducer  Needle gauge: 24 G Needle length: 9 cm Additional Notes Negative paresthesia. Negative blood return. Positive free-flowing CSF. Expiration date of kit checked and confirmed. Patient tolerated procedure well, without complications.

## 2020-04-07 NOTE — Progress Notes (Signed)
Brief Pharmacy Note  Pharmacy was consulted for VTE prophylaxis and to stop 6 hours prior to the procedure. Per notes from Dr. Fritzi Mandes today, "DVT prophylaxis per ortho". At this time, pharmacy will sign-off.   Thank you for allowing pharmacy to be a part of this patient's care.  Kristeen Miss, PharmD Clinical Pharmacist

## 2020-04-07 NOTE — Progress Notes (Signed)
Initial Nutrition Assessment  DOCUMENTATION CODES:   Obesity unspecified  INTERVENTION:  Provide Ensure Enlive po BID, each supplement provides 350 kcal and 20 grams of protein. Patient prefers chocolate or strawberry.  Provide MVI po daily.  Encouraged adequate intake of protein at meals. Discussed which foods contain protein on our menu and reviewed options patient enjoys.  NUTRITION DIAGNOSIS:   Increased nutrient needs related to post-op healing as evidenced by estimated needs.  GOAL:   Patient will meet greater than or equal to 90% of their needs  MONITOR:   Diet advancement, PO intake, Supplement acceptance, Labs, Weight trends, I & O's, Skin  REASON FOR ASSESSMENT:   Consult Assessment of nutrition requirement/status  ASSESSMENT:   86 year old female with PMHx of COPD, DM, neuropathy, HTN, GERD, ulcerative colitis, HTN, osteoarthritis, depression admitted from [facility] after fall out of wheelchair with acute left femoral neck fracture.   Met with patient and her daughter at bedside this morning. Patient reports she has had decreased intake at [facility] as she does not like their food there. She is on a carbohydrate-controlled diet with no salt packets on trays. She reports if it is a meal she enjoys she can eat fairly well, but often she only eats bites of the meals. She enjoys cold cut sandwiches, grilled cheese sandwiches, macaroni and cheese, chips, and cake. She drinks Ensure at lunch each day and enjoys these (prefers chocolate or strawberry). Discussed increased nutrient needs for post-operative healing. Patient is amenable to drinking oral nutrition supplements here to help meet calorie/protein needs. Encouraged adequate intake of protein at meals.   Patient reports her UBW was 198 lbs many years ago and she has lost weight over time. Per review of chart patient was documented to be 61.6 kg on 05/30/2019. She was 75.7 kg (166.89 lbs) on  04/07/2020.  Medications reviewed and include: Novolog 0-15 units TID with meals, Remeron 7.5 mg QHS, Protonix, Miralax, cefazolin.  Labs reviewed: CBG 105-152. HgbA1c 6.3 on 11/2.  Patient does not meet criteria for malnutrition at this time.  NUTRITION - FOCUSED PHYSICAL EXAM:    Most Recent Value  Orbital Region No depletion  Upper Arm Region No depletion  Thoracic and Lumbar Region No depletion  Buccal Region No depletion  Temple Region No depletion  Clavicle Bone Region No depletion  Clavicle and Acromion Bone Region No depletion  Scapular Bone Region No depletion  Dorsal Hand Mild depletion  Patellar Region No depletion  Anterior Thigh Region No depletion  Posterior Calf Region No depletion  Edema (RD Assessment) Mild  Hair Reviewed  Eyes Reviewed  Mouth Reviewed  Skin Reviewed  Nails Reviewed     Diet Order:   Diet Order            Diet NPO time specified  Diet effective midnight                EDUCATION NEEDS:   No education needs have been identified at this time  Skin:  Skin Assessment: Reviewed RN Assessment  Last BM:  Unknown/PTA  Height:   Ht Readings from Last 1 Encounters:  04/07/20 5' 2"  (1.575 m)   Weight:   Wt Readings from Last 1 Encounters:  04/07/20 75.7 kg   BMI:  Body mass index is 30.52 kg/m.  Estimated Nutritional Needs:   Kcal:  1600-1800  Protein:  85-95 grams  Fluid:  1.6-1.8 L/day  Jacklynn Barnacle, MS, RD, LDN Pager number available on Amion

## 2020-04-07 NOTE — Progress Notes (Signed)
Pt taken for sx. Daughter is at bedside.

## 2020-04-07 NOTE — Consult Note (Signed)
ORTHOPAEDIC CONSULTATION  REQUESTING PHYSICIAN: Fritzi Mandes, MD  Chief Complaint:   L hip pain  History of Present Illness: Chelsea [name] is a 85 y.o. female who has had multiple falls over the past 6 months with most recent fall occurring this past week.  At baseline, she has significant dementia.  She is nonambulatory and wheelchair dependent.  The patient occasionally gets her foot caught and falls out of her wheelchair.  She lives at [facility]. X-rays in the emergency department show a left valgus impacted femoral neck fracture.  Past Medical History:  Diagnosis Date  . Anemia   . Chronic obstructive asthma ([place])   . COPD (chronic obstructive pulmonary disease) ([place])   . Depression   . Diabetes mellitus without complication ([place])   . Femoral bruit   . GERD without esophagitis   . Hypertension   . Hypertension   . Lumbago   . Neuropathy   . Osteoarthritis   . Psoriasis   . Tremor   . Ulcerative colitis ([place])   . Vertigo    Past Surgical History:  Procedure Laterality Date  . ABDOMINAL HYSTERECTOMY    . APPENDECTOMY    . ESOPHAGOGASTRODUODENOSCOPY (EGD) WITH PROPOFOL N/A 07/16/2018   Procedure: ESOPHAGOGASTRODUODENOSCOPY (EGD) WITH PROPOFOL;  Surgeon: [name], MD;  Location: [facility] ENDOSCOPY;  Service: Endoscopy;  Laterality: N/A;  . EYE SURGERY     Social History   Socioeconomic History  . Marital status: Widowed    Spouse name: Not on file  . Number of children: Not on file  . Years of education: Not on file  . Highest education level: Not on file  Occupational History  . Not on file  Tobacco Use  . Smoking status: Former Smoker    Types: Cigarettes  . Smokeless tobacco: Never Used  Substance and Sexual Activity  . Alcohol use: No  . Drug use: No  . Sexual activity: Never  Other Topics Concern  . Not on file  Social History Narrative  . Not on file   Social Determinants of Health    Financial Resource Strain:   . Difficulty of Paying Living Expenses: Not on file  Food Insecurity:   . Worried About Charity fundraiser in the Last Year: Not on file  . Ran Out of Food in the Last Year: Not on file  Transportation Needs:   . Lack of Transportation (Medical): Not on file  . Lack of Transportation (Non-Medical): Not on file  Physical Activity:   . Days of Exercise per Week: Not on file  . Minutes of Exercise per Session: Not on file  Stress:   . Feeling of Stress : Not on file  Social Connections:   . Frequency of Communication with Friends and Family: Not on file  . Frequency of Social Gatherings with Friends and Family: Not on file  . Attends Religious Services: Not on file  . Active Member of Clubs or Organizations: Not on file  . Attends Archivist Meetings: Not on file  . Marital Status: Not on file   Family History  Problem Relation Age of Onset  . Diabetes Mother   . Liver cancer Father    Allergies  Allergen Reactions  . Lyrica [Pregabalin] Swelling   Prior to Admission medications   Medication Sig Start Date End Date Taking? Authorizing Provider  acetaminophen (TYLENOL) 325 MG tablet Take 650 mg by mouth every 6 (six) hours as needed.   Yes [provider]  acetaminophen (  TYLENOL) 500 MG tablet Take 500 mg by mouth in the morning and at bedtime.  02/25/18  Yes [provider]  albuterol (VENTOLIN HFA) 108 (90 Base) MCG/ACT inhaler Inhale 2 puffs into the lungs 2 (two) times daily as needed for wheezing or shortness of breath.   Yes [provider]  alum & mag hydroxide-simeth (MAALOX/MYLANTA) 200-200-20 MG/5ML suspension Take 30 mLs by mouth every 6 (six) hours as needed for indigestion or heartburn.   Yes [provider]  Amino Acids-Protein Hydrolys (FEEDING SUPPLEMENT, PRO-STAT SUGAR FREE 64,) LIQD Take 30 mLs by mouth daily.    Yes [provider]  diphenhydrAMINE (BENADRYL) 25 mg capsule Take  25 mg by mouth every 6 (six) hours as needed.   Yes [provider]  donepezil (ARICEPT) 10 MG tablet Take 10 mg by mouth at bedtime.   Yes [provider]  DULoxetine (CYMBALTA) 60 MG capsule Take 120 mg by mouth daily.  06/24/18  Yes [provider]  ferrous sulfate 325 (65 FE) MG EC tablet Take 1 tablet (325 mg total) by mouth 2 (two) times daily with a meal for 30 days. 07/22/18 04/06/20 Yes Sudini, Alveta Heimlich, MD  Fluticasone-Salmeterol (ADVAIR) 250-50 MCG/DOSE AEPB Inhale 1 puff into the lungs 2 (two) times daily.    Yes [provider]  gabapentin (NEURONTIN) 300 MG capsule Take 300 mg by mouth 4 (four) times daily.   Yes [provider]  guaifenesin (ROBITUSSIN) 100 MG/5ML syrup Take 200 mg by mouth every 6 (six) hours as needed for cough. 04/04/18  Yes [provider]  lamoTRIgine (LAMICTAL) 25 MG tablet Take 25 mg by mouth 2 (two) times daily.  05/29/18  Yes [provider]  loratadine (CLARITIN) 10 MG tablet Take 10 mg by mouth daily.    Yes [provider]  losartan (COZAAR) 50 MG tablet Take 1 tablet (50 mg total) by mouth daily. Patient taking differently: Take 25 mg by mouth daily.  07/19/18  Yes Gladstone Lighter, MD  magnesium hydroxide (MILK OF MAGNESIA) 400 MG/5ML suspension Take 30 mLs by mouth daily as needed for mild constipation. 07/19/18  Yes Gladstone Lighter, MD  magnesium oxide (MAG-OX) 400 MG tablet Take 800 mg by mouth 3 (three) times daily.    Yes [provider]  Melatonin 3 MG TABS Take 1 tablet by mouth at bedtime. 05/13/18  Yes [provider]  metFORMIN (GLUCOPHAGE-XR) 500 MG 24 hr tablet Take 500 mg by mouth daily.    Yes [provider]  metoprolol tartrate (LOPRESSOR) 25 MG tablet Take 12.5 mg by mouth 2 (two) times daily.    Yes [provider]  Multiple Vitamins-Minerals (MULTIVITAMIN WITH MINERALS) tablet Take 1 tablet by mouth daily.   Yes [provider]  OXYGEN Inhale 2 L/min into the lungs at bedtime. 07/04/18  Yes [provider]  pantoprazole (PROTONIX) 40 MG tablet Take 40 mg by mouth daily.   Yes [provider]  polyethylene glycol (MIRALAX / GLYCOLAX) 17 g packet Take 17 g by mouth daily.   Yes [provider]  traMADol (ULTRAM) 50 MG tablet Take 50 mg by mouth in the morning and at bedtime. Can also take as needed every 6 hours   Yes [provider]  trolamine salicylate (ASPERCREME) 10 % cream Apply 1 application topically 4 (four) times daily as needed for muscle pain.   Yes [provider]   Recent Labs    04/06/20 1643 04/06/20 1847  WBC 9.5  --   HGB 11.5*  --   HCT 35.2*  --   PLT 289  --   K 5.2*  --   CL 98  --   CO2 27  --   BUN 19  --   CREATININE 1.56*  --   GLUCOSE 118*  --   CALCIUM 9.0  --   INR  --  1.1   DG Chest 1 View  Result Date: 04/06/2020 CLINICAL DATA:  Fall. EXAM: CHEST  1 VIEW COMPARISON:  July 15, 2018. FINDINGS: The heart size and mediastinal contours are within normal limits. No pneumothorax is noted. Right lung is clear. Mild left basilar subsegmental atelectasis or infiltrate is noted laterally with possible small effusion. The visualized skeletal structures are unremarkable. IMPRESSION: Mild left basilar subsegmental atelectasis or infiltrate is noted laterally with possible small left pleural effusion. Aortic Atherosclerosis (ICD10-I70.0). Electronically Signed   By: Marijo Conception M.D.   On: 04/06/2020 17:36   CT Hip Left Wo Contrast  Result Date: 04/06/2020 CLINICAL DATA:  Fall, left hip fracture EXAM: CT OF THE LEFT HIP WITHOUT CONTRAST TECHNIQUE: Multidetector CT imaging of the left hip was performed according to the standard protocol. Multiplanar CT image reconstructions were also generated. COMPARISON:  None. FINDINGS: Bones/Joint/Cartilage There is an acute subcapital minimally impacted femoral neck fracture best noted on coronal image #  49/4 with fracture fragments in near anatomic alignment. There is superimposed mild to moderate degenerative arthritis of the left hip with joint space narrowing and osteophyte formation. No destructive osseous lesion. Ligaments Suboptimally assessed by CT. Muscles and Tendons Unremarkable Soft tissues Extensive sigmoid diverticulosis is noted within the visualized pelvis. Foley catheter balloon is seen within a decompressed bladder lumen. Advanced vascular calcifications are seen within the visualized lower extremity arterial outflow. Status post hysterectomy. IMPRESSION: Acute, minimally impacted subcapital left femoral neck fracture with fracture fragments in near anatomic alignment. Electronically Signed   By: Fidela Salisbury MD   On: 04/06/2020 19:05   DG Hip Unilat W or Wo Pelvis 2-3 Views Left  Result Date: 04/06/2020 CLINICAL DATA:  Left hip pain after fall. EXAM: DG HIP (WITH OR WITHOUT PELVIS) 2-3V LEFT COMPARISON:  None. FINDINGS: Mildly displaced subcapital proximal left femoral fracture is noted. Right hip is unremarkable. IMPRESSION: Mildly displaced subcapital proximal left femoral fracture. Electronically Signed   By: Marijo Conception M.D.   On: 04/06/2020 17:35     Positive ROS: All other systems have been reviewed and were otherwise negative with the exception of those mentioned in the HPI and as above.  Physical Exam: BP 136/64 (BP Location: Right Arm)   Pulse 87   Temp 98.6 F (37 C) (Oral)   Resp 17   SpO2 95%  General:  Alert, no acute distress Psychiatric:  Patient is competent for consent with normal mood and affect   Cardiovascular:  No pedal edema, regular rate and rhythm Respiratory:  No wheezing, non-labored breathing GI:  Abdomen is soft and non-tender Skin:  No lesions in the area of chief complaint, no erythema Neurologic:  Sensation intact distally, CN grossly intact Lymphatic:  No axillary or cervical lymphadenopathy  Orthopedic Exam:  LLE: + DF/PF/EHL SILT  grossly over foot Foot wwp +Log roll/axial load   X-rays:  As above: L valgus impacted femoral neck fracture     Assessment/Plan: Tanaiya Kolarik is a 86 y.o. female with a L valgus impacted femoral neck fracture  1. I discussed the various  treatment options including both surgical and non-surgical management of her fracture with the patient's daughter (medical PoA). We discussed the high risk of perioperative complications due to patient's age, dementia, and other co-morbidities. After discussion of risks, benefits, and alternatives to surgery, we were in agreement to proceed with surgery. The goals of surgery would be to provide adequate pain relief and allow for mobilization. Plan for surgery is L hip percutaneous pinning today, 04/07/2020. 2. NPO until OR 3. Hold anticoagulation in advance of OR    Chelsea [name]   04/07/2020 9:43 AM

## 2020-04-07 NOTE — H&P (Signed)
H&P reviewed. No significant changes noted.

## 2020-04-07 NOTE — Progress Notes (Signed)
[place] [place] NAME: Chelsea [name]    MR#:  [id number]  DATE OF BIRTH:  Dec 31, 1930  SUBJECTIVE:   Patient came in from her long-term facility after she apparently slid off her wheelchair while it was taking her in her wheelchair. She is frustrated with what happened to her. Awaiting hip fracture surgery. No family in the room. REVIEW OF SYSTEMS:   Review of Systems  Constitutional: Negative for chills, fever and weight loss.  HENT: Negative for ear discharge, ear pain and nosebleeds.   Eyes: Negative for blurred vision, pain and discharge.  Respiratory: Negative for sputum production, shortness of breath, wheezing and stridor.   Cardiovascular: Negative for chest pain, palpitations, orthopnea and PND.  Gastrointestinal: Negative for abdominal pain, diarrhea, nausea and vomiting.  Genitourinary: Negative for frequency and urgency.  Musculoskeletal: Positive for joint pain. Negative for back pain.  Neurological: Positive for weakness. Negative for sensory change, speech change and focal weakness.  Psychiatric/Behavioral: Negative for depression and hallucinations. The patient is not nervous/anxious.    Tolerating Diet:npo for surgery Tolerating PT: pending-- at baseline patient is mostly wheelchair-bound.  DRUG ALLERGIES:   Allergies  Allergen Reactions  . Lyrica [Pregabalin] Swelling    VITALS:  Blood pressure (!) 154/61, pulse 69, temperature 98.3 F (36.8 C), temperature source Temporal, resp. rate 16, height 5' 2"  (1.575 m), weight 75.7 kg, SpO2 92 %.  PHYSICAL EXAMINATION:   Physical Exam  GENERAL:  86 y.o.-year-old patient lying in the bed with no acute distress.  HEENT: Head atraumatic, normocephalic. Oropharynx and nasopharynx clear. Dry oral mucosa NECK:  Supple, no jugular venous distention. No thyroid enlargement, no tenderness.  LUNGS: Normal breath sounds bilaterally, no wheezing, rales, rhonchi. No use of  accessory muscles of respiration.  CARDIOVASCULAR: S1, S2 normal. No murmurs, rubs, or gallops.  ABDOMEN: Soft, nontender, nondistended. Bowel sounds present. No organomegaly or mass.  EXTREMITIES: No cyanosis, clubbing or edema b/l.   Restricted range of motion left leg NEUROLOGIC: Cranial nerves II through XII are intact. No focal Motor or sensory deficits b/l.   PSYCHIATRIC:  patient is alert and awake. Has dementia at baseline. SKIN: No obvious rash, lesion, or ulcer.   LABORATORY PANEL:  CBC Recent Labs  Lab 04/06/20 1643  WBC 9.5  HGB 11.5*  HCT 35.2*  PLT 289    Chemistries  Recent Labs  Lab 04/06/20 1643  NA 136  K 5.2*  CL 98  CO2 27  GLUCOSE 118*  BUN 19  CREATININE 1.56*  CALCIUM 9.0  AST 28  ALT 7  ALKPHOS 84  BILITOT 1.3*   Cardiac Enzymes No results for input(s): TROPONINI in the last 168 hours. RADIOLOGY:  DG Chest 1 View  Result Date: 04/06/2020 CLINICAL DATA:  Fall. EXAM: CHEST  1 VIEW COMPARISON:  July 15, 2018. FINDINGS: The heart size and mediastinal contours are within normal limits. No pneumothorax is noted. Right lung is clear. Mild left basilar subsegmental atelectasis or infiltrate is noted laterally with possible small effusion. The visualized skeletal structures are unremarkable. IMPRESSION: Mild left basilar subsegmental atelectasis or infiltrate is noted laterally with possible small left pleural effusion. Aortic Atherosclerosis (ICD10-I70.0). Electronically Signed   By: Marijo Conception M.D.   On: 04/06/2020 17:36   CT Hip Left Wo Contrast  Result Date: 04/06/2020 CLINICAL DATA:  Fall, left hip fracture EXAM: CT OF THE LEFT HIP WITHOUT CONTRAST TECHNIQUE: Multidetector CT imaging of the left hip was  performed according to the standard protocol. Multiplanar CT image reconstructions were also generated. COMPARISON:  None. FINDINGS: Bones/Joint/Cartilage There is an acute subcapital minimally impacted femoral neck fracture best noted on  coronal image # 49/4 with fracture fragments in near anatomic alignment. There is superimposed mild to moderate degenerative arthritis of the left hip with joint space narrowing and osteophyte formation. No destructive osseous lesion. Ligaments Suboptimally assessed by CT. Muscles and Tendons Unremarkable Soft tissues Extensive sigmoid diverticulosis is noted within the visualized pelvis. Foley catheter balloon is seen within a decompressed bladder lumen. Advanced vascular calcifications are seen within the visualized lower extremity arterial outflow. Status post hysterectomy. IMPRESSION: Acute, minimally impacted subcapital left femoral neck fracture with fracture fragments in near anatomic alignment. Electronically Signed   By: Fidela Salisbury MD   On: 04/06/2020 19:05   DG Hip Unilat W or Wo Pelvis 2-3 Views Left  Result Date: 04/06/2020 CLINICAL DATA:  Left hip pain after fall. EXAM: DG HIP (WITH OR WITHOUT PELVIS) 2-3V LEFT COMPARISON:  None. FINDINGS: Mildly displaced subcapital proximal left femoral fracture is noted. Right hip is unremarkable. IMPRESSION: Mildly displaced subcapital proximal left femoral fracture. Electronically Signed   By: Marijo Conception M.D.   On: 04/06/2020 17:35   ASSESSMENT AND PLAN:  Chelsea [name] is a 86 y.o. female with medical history significant of DM II, HTN, COPD,UC, seen in ed for fall that happened a week ago and left hip fracture.Per pt her caretaker was pushing her in wheelchair and she slid to left side and fell.  Acute left femoral neck fracture -PRN IV and PO pain meds -orthopedic consultation with Dr. Posey Pronto appreciated. Patient is-down for surgery today. -Physical therapy occupational therapy from tomorrow -DVT prophylaxis per ortho -IV fluids  Acute on chronic renal failure suspect prerenal azotemia -IV fluids -monitor input output, avoid nephrotoxic agents. -Came in with creatinine of 1.56. Baseline creatinine 1.1  Type II diabetes  non-insulin-dependent with neuropathy, sugars well controlled. A1c 6.3 -continue sliding scale -resume home meds once PO intake improves after surgery  Dementia -chronic -continue Aricept and Cymbalta  COPD -stable -continue inhalers and Claritin  Hypertension continue metoprolol holding losartan given elevated creatinine    Procedures: Family communication :dter Gaspar Skeeters on the phone Consults :ortho CODE STATUS: DNR--confirmed with dter  DVT Prophylaxis :SCD for now  Status is: Inpatient    Dispo: The patient is from: SNF              Anticipated d/c is to: SNF              Anticipated d/c date is: 2 days              Patient currently is not medically stable to d/c.        TOTAL TIME TAKING CARE OF THIS PATIENT: 25 minutes.  >50% time spent on counselling and coordination of care  Note: This dictation was prepared with Dragon dictation along with smaller phrase technology. Any transcriptional errors that result from this process are unintentional.  Fritzi Mandes M.D    [facility]   CC: Primary care physician; Pcp, NoPatient ID: Chelsea [name], female   DOB: 01/13/1931, 86 y.o.   MRN: [id number]

## 2020-04-08 ENCOUNTER — Encounter: Payer: Self-pay | Admitting: Orthopedic Surgery

## 2020-04-08 DIAGNOSIS — N179 Acute kidney failure, unspecified: Secondary | ICD-10-CM | POA: Diagnosis not present

## 2020-04-08 DIAGNOSIS — J449 Chronic obstructive pulmonary disease, unspecified: Secondary | ICD-10-CM | POA: Diagnosis not present

## 2020-04-08 DIAGNOSIS — F0390 Unspecified dementia without behavioral disturbance: Secondary | ICD-10-CM | POA: Diagnosis not present

## 2020-04-08 DIAGNOSIS — S72002A Fracture of unspecified part of neck of left femur, initial encounter for closed fracture: Secondary | ICD-10-CM | POA: Diagnosis not present

## 2020-04-08 LAB — BASIC METABOLIC PANEL
Anion gap: 8 (ref 5–15)
BUN: 22 mg/dL (ref 8–23)
CO2: 29 mmol/L (ref 22–32)
Calcium: 8.7 mg/dL — ABNORMAL LOW (ref 8.9–10.3)
Chloride: 96 mmol/L — ABNORMAL LOW (ref 98–111)
Creatinine, Ser: 1.27 mg/dL — ABNORMAL HIGH (ref 0.44–1.00)
GFR, Estimated: 41 mL/min — ABNORMAL LOW (ref >60)
Glucose, Bld: 120 mg/dL — ABNORMAL HIGH (ref 70–99)
Potassium: 4.1 mmol/L (ref 3.5–5.1)
Sodium: 133 mmol/L — ABNORMAL LOW (ref 135–145)

## 2020-04-08 LAB — GLUCOSE, CAPILLARY
Glucose-Capillary: 107 mg/dL — ABNORMAL HIGH (ref 70–99)
Glucose-Capillary: 139 mg/dL — ABNORMAL HIGH (ref 70–99)
Glucose-Capillary: 131 mg/dL — ABNORMAL HIGH (ref 70–99)
Glucose-Capillary: 120 mg/dL — ABNORMAL HIGH (ref 70–99)

## 2020-04-08 LAB — CBC
HCT: 30.5 % — ABNORMAL LOW (ref 36.0–46.0)
Hemoglobin: 10 g/dL — ABNORMAL LOW (ref 12.0–15.0)
MCH: 31.2 pg (ref 26.0–34.0)
MCHC: 32.8 g/dL (ref 30.0–36.0)
MCV: 95 fL (ref 80.0–100.0)
Platelets: 294 10*3/uL (ref 150–400)
RBC: 3.21 MIL/uL — ABNORMAL LOW (ref 3.87–5.11)
RDW: 13.2 % (ref 11.5–15.5)
WBC: 8.7 10*3/uL (ref 4.0–10.5)
nRBC: 0 % (ref 0.0–0.2)

## 2020-04-08 MED ORDER — ENOXAPARIN SODIUM 30 MG/0.3ML ~~LOC~~ SOLN: 30.0000 mg | SUBCUTANEOUS | 0 refills | Status: DC

## 2020-04-08 MED ORDER — ONDANSETRON HCL 4 MG PO TABS: 4.0000 mg | ORAL_TABLET | Freq: Four times a day (QID) | ORAL | 0 refills | Status: DC | PRN

## 2020-04-08 MED ORDER — OXYCODONE HCL 5 MG PO TABS
2.5000 mg | ORAL_TABLET | ORAL | 0 refills | Status: DC | PRN
Start: 2020-04-08 — End: 2021-12-06

## 2020-04-08 MED ORDER — ENSURE ENLIVE PO LIQD
237.0000 mL | Freq: Two times a day (BID) | ORAL | Status: DC
  Administered 2020-04-08 (×2): 237 mL via ORAL

## 2020-04-08 MED ORDER — SUCCINYLCHOLINE CHLORIDE 200 MG/10ML IV SOSY
PREFILLED_SYRINGE | INTRAVENOUS | Status: AC
  Filled 2020-04-08: qty 10

## 2020-04-08 MED ORDER — ADULT MULTIVITAMIN W/MINERALS CH
1.0000 | ORAL_TABLET | Freq: Every day | ORAL | Status: DC
  Administered 2020-04-08 – 2020-04-09 (×2): 1 via ORAL
  Filled 2020-04-08 (×2): qty 1

## 2020-04-08 MED ORDER — PHENYLEPHRINE HCL (PRESSORS) 10 MG/ML IV SOLN
INTRAVENOUS | Status: AC
  Filled 2020-04-08: qty 1

## 2020-04-08 MED ORDER — TRAMADOL HCL 50 MG PO TABS: 50.0000 mg | ORAL_TABLET | Freq: Four times a day (QID) | ORAL | 0 refills | Status: DC | PRN

## 2020-04-08 NOTE — Progress Notes (Signed)
Physical Therapy Treatment Patient Details Name: Chelsea [name] MRN: [id number] DOB: [birth year] Today's Date: 04/08/2020    History of Present Illness Pt is an 86 y.o. female presenting to hospital 11/2 with persistent L hip pain s/p fall about 1 week ago (caretaker pushing pt in w/c and pt slid to L side and fell).  Pt s/p 11/3 percutaneous pinning L valgus impacted femoral neck fx.  PMH includes dementia, 3 L O2 baseline secondary COPD, asthma, anemia, DM, htn, neuropathy, OA, tremor, psoriasis, vertigo.    PT Comments    Pt sitting in recliner upon PT arrival and reporting being uncomfortable and needing to toilet; pt's daughter left during session.  Pt did not want to use a walker so performed stand pivot transfers instead (required mod assist x2 for all transfers).  Performed stand pivot towards R recliner to [facility]; after pt finished toileting, performed stand pivot towards L BSC to recliner (pt requiring sitting rest break) and then recliner to bed.  Increased L hip/thigh pain noted with activity but minimal pain at rest end of session.  Pt repositioned for comfort in bed end of session.  Will continue to focus on strengthening and progressive functional mobility per pt tolerance.   Follow Up Recommendations  Home health PT;Supervision/Assistance - 24 hour (at LTC facility)     Equipment Recommendations  Other (comment) (pt reports having w/c at facility already)    Recommendations for Other Services OT consult     Precautions / Restrictions Precautions Precautions: Fall Restrictions Weight Bearing Restrictions: Yes LLE Weight Bearing: Weight bearing as tolerated    Mobility  Bed Mobility Overal bed mobility: Needs Assistance Bed Mobility: Sit to Supine     Sit to supine: Min assist;Mod assist;+2 for physical assistance   General bed mobility comments: assist for trunk and L>R LE; vc's for technique  Transfers Overall transfer level: Needs assistance Equipment used:  None Transfers: Stand Pivot Transfers   Stand pivot transfers: Mod assist;+2 physical assistance       General transfer comment: stand pivot recliner to [facility] (towards R); stand pivot BSC to recliner and then recliner to bed (towards L); vc's for technique  Ambulation/Gait             General Gait Details: Deferred (pt non-ambulatory)   Stairs             Wheelchair Mobility    Modified Rankin (Stroke Patients Only)       Balance Overall balance assessment: Needs assistance Sitting-balance support: No upper extremity supported;Feet supported Sitting balance-Leahy Scale: Good Sitting balance - Comments: steady sitting reaching within BOS   Standing balance support: Bilateral upper extremity supported Standing balance-Leahy Scale: Poor Standing balance comment: pt requiring B UE support for static standing balance                            Cognition Arousal/Alertness: Awake/alert Behavior During Therapy: WFL for tasks assessed/performed Overall Cognitive Status: Within Functional Limits for tasks assessed                                 General Comments: Oriented to person, place, and general situation      Exercises Total Joint Exercises Ankle Circles/Pumps: AROM;Strengthening;Both;10 reps;Supine Quad Sets: AROM;Strengthening;Both;10 reps;Supine Short Arc Quad: AAROM;Strengthening;Left;10 reps;Supine Heel Slides: AAROM;Strengthening;Left;10 reps;Supine Hip ABduction/ADduction: AAROM;Strengthening;Left;10 reps;Supine    General Comments   Nursing cleared pt for  participation in physical therapy.  Pt agreeable to PT session.      Pertinent Vitals/Pain Pain Assessment: Faces Pain Score: 8  Faces Pain Scale: Hurts a little bit (2/10 at rest; 8/10 with activity) Pain Location: L hip Pain Descriptors / Indicators: Aching;Grimacing;Guarding;Discomfort;Tender;Sore Pain Intervention(s): Limited activity within patient's  tolerance;Monitored during session;Repositioned;Ice applied  Vitals (HR and O2 on 2 L O2 via nasal cannula) stable and WFL throughout treatment session.    Home Living                Prior Function      PT Goals (current goals can now be found in the care plan section) Acute Rehab PT Goals Patient Stated Goal: to improve pain PT Goal Formulation: With patient Time For Goal Achievement: 04/22/20 Potential to Achieve Goals: Fair Progress towards PT goals: Progressing toward goals    Frequency    BID      PT Plan Current plan remains appropriate    Co-evaluation              AM-PAC PT "6 Clicks" Mobility   Outcome Measure  Help needed turning from your back to your side while in a flat bed without using bedrails?: A Little Help needed moving from lying on your back to sitting on the side of a flat bed without using bedrails?: A Lot Help needed moving to and from a bed to a chair (including a wheelchair)?: Total Help needed standing up from a chair using your arms (e.g., wheelchair or bedside chair)?: Total Help needed to walk in hospital room?: Total Help needed climbing 3-5 steps with a railing? : Total 6 Click Score: 9    End of Session Equipment Utilized During Treatment: Gait belt Activity Tolerance: Patient limited by pain Patient left: in bed;with call bell/phone within reach;with bed alarm set;with family/visitor present;with SCD's reapplied;Other (comment) (B heels floating via pillow support; ice pack to L hip; bed in lowest position) Nurse Communication: Mobility status;Precautions;Weight bearing status PT Visit Diagnosis: Other abnormalities of gait and mobility (R26.89);Muscle weakness (generalized) (M62.81);History of falling (Z91.81);Pain Pain - Right/Left: Left Pain - part of body: Hip     Time: 4970-2637 PT Time Calculation (min) (ACUTE ONLY): 43 min  Charges: $Therapeutic Activity: 23-37 mins                     Leitha Bleak,  PT 04/08/20, 5:34 PM

## 2020-04-08 NOTE — Progress Notes (Signed)
  Subjective: 1 Day Post-Op Procedure(s) (LRB): CANNULATED HIP PINNING-Left Hip Hemi (Left) Patient reports pain as mild.   Patient is well, and has had no acute complaints or problems Plan is to go home versus Rehab after hospital stay. Negative for chest pain and shortness of breath Fever: no Gastrointestinal: Negative for nausea and vomiting  Objective: Vital signs in last 24 hours: Temp:  [97.3 F (36.3 C)-98.9 F (37.2 C)] 98.9 F (37.2 C) (11/04 0420) Pulse Rate:  [65-87] 71 (11/04 0420) Resp:  [14-23] 16 (11/04 0420) BP: (90-184)/(42-82) 112/60 (11/04 0420) SpO2:  [92 %-100 %] 93 % (11/04 0420) Weight:  [75.7 kg] 75.7 kg (11/03 1315)  Intake/Output from previous day:  Intake/Output Summary (Last 24 hours) at 04/08/2020 0710 Last data filed at 04/08/2020 0605 Gross per 24 hour  Intake 1425 ml  Output 1350 ml  Net 75 ml    Intake/Output this shift: No intake/output data recorded.  Labs: Recent Labs    04/06/20 1643 04/08/20 0608  HGB 11.5* 10.0*   Recent Labs    04/06/20 1643 04/08/20 0608  WBC 9.5 8.7  RBC 3.73* 3.21*  HCT 35.2* 30.5*  PLT 289 294   Recent Labs    04/06/20 1643  NA 136  K 5.2*  CL 98  CO2 27  BUN 19  CREATININE 1.56*  GLUCOSE 118*  CALCIUM 9.0   Recent Labs    04/06/20 1847  INR 1.1     EXAM General - Patient is Alert and Oriented Extremity - Neurovascular intact Sensation intact distally Dorsiflexion/Plantar flexion intact Compartment soft Dressing/Incision - clean, dry, no drainage Motor Function - intact, moving foot and toes well on exam.   Past Medical History:  Diagnosis Date  . Anemia   . Chronic obstructive asthma ([place])   . COPD (chronic obstructive pulmonary disease) ([place])   . Depression   . Diabetes mellitus without complication ([place])   . Femoral bruit   . GERD without esophagitis   . Hypertension   . Hypertension   . Lumbago   . Neuropathy   . Osteoarthritis   . Psoriasis   . Tremor   .  Ulcerative colitis ([place])   . Vertigo     Assessment/Plan: 1 Day Post-Op Procedure(s) (LRB): CANNULATED HIP PINNING-Left Hip Hemi (Left) Principal Problem:   Closed fracture of left hip (HCC) Active Problems:   Chronic obstructive pulmonary disease (COPD) (HCC)   Diabetes mellitus type 2, noninsulin dependent (HCC)   Essential hypertension, benign   Vascular dementia with behavior disturbance (HCC)   Neuropathy due to type 2 diabetes mellitus (HCC)   Closed left hip fracture (HCC)   Acute kidney injury superimposed on CKD ([place])   Dementia without behavioral disturbance (HCC)  Estimated body mass index is 30.52 kg/m as calculated from the following:   Height as of this encounter: 5' 2"  (1.575 m).   Weight as of this encounter: 75.7 kg. Advance diet Up with therapy D/C IV fluids  DVT Prophylaxis - Lovenox, Foot Pumps and TED hose Weight-Bearing as tolerated to left leg  Reche Dixon, PA-C Orthopaedic Surgery 04/08/2020, 7:10 AM

## 2020-04-08 NOTE — Progress Notes (Signed)
[place] [facility] [place] NAME: Chelsea [name]    MR#:  [id number]  DATE OF BIRTH:  [birth year]  SUBJECTIVE:   Patient came in from her long-term facility after she apparently slid off her wheelchair while it was taking her in her wheelchair. Postop day one. Overall doing well. Working with physical therapy. No family in the room. REVIEW OF SYSTEMS:   Review of Systems  Constitutional: Negative for chills, fever and weight loss.  HENT: Negative for ear discharge, ear pain and nosebleeds.   Eyes: Negative for blurred vision, pain and discharge.  Respiratory: Negative for sputum production, shortness of breath, wheezing and stridor.   Cardiovascular: Negative for chest pain, palpitations, orthopnea and PND.  Gastrointestinal: Negative for abdominal pain, diarrhea, nausea and vomiting.  Genitourinary: Negative for frequency and urgency.  Musculoskeletal: Positive for joint pain. Negative for back pain.  Neurological: Positive for weakness. Negative for sensory change, speech change and focal weakness.  Psychiatric/Behavioral: Negative for depression and hallucinations. The patient is not nervous/anxious.    Tolerating Diet: yes tolerating PT: rehab  DRUG ALLERGIES:   Allergies  Allergen Reactions  . Lyrica [Pregabalin] Swelling    VITALS:  Blood pressure (!) 125/56, pulse 70, temperature 98.5 F (36.9 C), temperature source Oral, resp. rate 16, height 5' 2"  (1.575 m), weight 75.7 kg, SpO2 94 %.  PHYSICAL EXAMINATION:   Physical Exam  GENERAL:  86 y.o.-year-old patient lying in the bed with no acute distress.  HEENT: Head atraumatic, normocephalic. Oropharynx and nasopharynx clear. Dry oral mucosa NECK:  Supple, no jugular venous distention. No thyroid enlargement, no tenderness.  LUNGS: Normal breath sounds bilaterally, no wheezing, rales, rhonchi. No use of accessory muscles of respiration.  CARDIOVASCULAR: S1, S2 normal. No murmurs,  rubs, or gallops.  ABDOMEN: Soft, nontender, nondistended. Bowel sounds present. No organomegaly or mass.  EXTREMITIES: No cyanosis, clubbing or edema b/l.   Surgical dressing intact NEUROLOGIC: Cranial nerves II through XII are intact. No focal Motor or sensory deficits b/l.   PSYCHIATRIC:  patient is alert and awake. Has dementia at baseline. SKIN: No obvious rash, lesion, or ulcer.   LABORATORY PANEL:  CBC Recent Labs  Lab 04/08/20 0608  WBC 8.7  HGB 10.0*  HCT 30.5*  PLT 294    Chemistries  Recent Labs  Lab 04/06/20 1643 04/06/20 1643 04/08/20 0608  NA 136   < > 133*  K 5.2*   < > 4.1  CL 98   < > 96*  CO2 27   < > 29  GLUCOSE 118*   < > 120*  BUN 19   < > 22  CREATININE 1.56*   < > 1.27*  CALCIUM 9.0   < > 8.7*  AST 28  --   --   ALT 7  --   --   ALKPHOS 84  --   --   BILITOT 1.3*  --   --    < > = values in this interval not displayed.   Cardiac Enzymes No results for input(s): TROPONINI in the last 168 hours. RADIOLOGY:  DG Chest 1 View  Result Date: 04/06/2020 CLINICAL DATA:  Fall. EXAM: CHEST  1 VIEW COMPARISON:  July 15, 2018. FINDINGS: The heart size and mediastinal contours are within normal limits. No pneumothorax is noted. Right lung is clear. Mild left basilar subsegmental atelectasis or infiltrate is noted laterally with possible small effusion. The visualized skeletal structures are unremarkable. IMPRESSION: Mild left  basilar subsegmental atelectasis or infiltrate is noted laterally with possible small left pleural effusion. Aortic Atherosclerosis (ICD10-I70.0). Electronically Signed   By: Marijo Conception M.D.   On: 04/06/2020 17:36   CT Hip Left Wo Contrast  Result Date: 04/06/2020 CLINICAL DATA:  Fall, left hip fracture EXAM: CT OF THE LEFT HIP WITHOUT CONTRAST TECHNIQUE: Multidetector CT imaging of the left hip was performed according to the standard protocol. Multiplanar CT image reconstructions were also generated. COMPARISON:  None. FINDINGS:  Bones/Joint/Cartilage There is an acute subcapital minimally impacted femoral neck fracture best noted on coronal image # 49/4 with fracture fragments in near anatomic alignment. There is superimposed mild to moderate degenerative arthritis of the left hip with joint space narrowing and osteophyte formation. No destructive osseous lesion. Ligaments Suboptimally assessed by CT. Muscles and Tendons Unremarkable Soft tissues Extensive sigmoid diverticulosis is noted within the visualized pelvis. Foley catheter balloon is seen within a decompressed bladder lumen. Advanced vascular calcifications are seen within the visualized lower extremity arterial outflow. Status post hysterectomy. IMPRESSION: Acute, minimally impacted subcapital left femoral neck fracture with fracture fragments in near anatomic alignment. Electronically Signed   By: Fidela Salisbury MD   On: 04/06/2020 19:05   DG HIP OPERATIVE UNILAT W OR W/O PELVIS RIGHT  Result Date: 04/07/2020 CLINICAL DATA:  Left hip pinning. EXAM: OPERATIVE left HIP (WITH PELVIS IF PERFORMED) 3 VIEWS TECHNIQUE: Fluoroscopic spot image(s) were submitted for interpretation post-operatively. FLUOROSCOPY TIME:  1 minutes 18 seconds. COMPARISON:  April 06, 2020. FINDINGS: Three intraoperative fluoroscopic images were obtained of the left hip. These images demonstrate surgical fixation of proximal left femoral fracture. IMPRESSION: Surgical internal fixation of proximal left femoral fracture. Electronically Signed   By: Marijo Conception M.D.   On: 04/07/2020 15:15   DG Hip Unilat W or Wo Pelvis 2-3 Views Left  Result Date: 04/06/2020 CLINICAL DATA:  Left hip pain after fall. EXAM: DG HIP (WITH OR WITHOUT PELVIS) 2-3V LEFT COMPARISON:  None. FINDINGS: Mildly displaced subcapital proximal left femoral fracture is noted. Right hip is unremarkable. IMPRESSION: Mildly displaced subcapital proximal left femoral fracture. Electronically Signed   By: Marijo Conception M.D.   On:  04/06/2020 17:35   ASSESSMENT AND PLAN:  Chelsea [name] is a 86 y.o. female with medical history significant of DM II, HTN, COPD,UC, seen in ed for fall that happened a week ago and left hip fracture.Per pt her caretaker was pushing her in wheelchair and she slid to left side and fell.  Acute left femoral neck fracture status post percutaneous pending of left femoral neck fracture POD number one -PRN IV and PO pain meds -orthopedic consultation with Dr. Posey Pronto appreciated. -Physical therapy occupational therapy input appreciated. -DVT prophylaxis per ortho  Acute on chronic renal failure suspect prerenal azotemia -received IV fluids. -monitor input output, avoid nephrotoxic agents. -Came in with creatinine of 1.56-- 1.2 Baseline creatinine 1.1  Type II diabetes non-insulin-dependent with neuropathy, sugars well controlled. A1c 6.3 -continue sliding scale -resume home meds once PO intake improves after surgery  Dementia -chronic -continue Aricept and Cymbalta  COPD -stable -continue inhalers and Claritin  Hypertension continue metoprolol holding losartan given elevated creatinine    Procedures: left percutaneous femoral neck pinning Family communication :dter Gaspar Skeeters on the phone Consults :ortho CODE STATUS: DNR--confirmed with dter  DVT Prophylaxis : Lovenox  Status is: Inpatient    Dispo: The patient is from: SNF  Anticipated d/c is to: SNF              Anticipated d/c date is: 2 days              Patient currently is not medically stable to d/c. -- postop day one  patient will discharge likely tomorrow to her long-term facility       TOTAL TIME TAKING CARE OF THIS PATIENT: 25 minutes.  >50% time spent on counselling and coordination of care  Note: This dictation was prepared with Dragon dictation along with smaller phrase technology. Any transcriptional errors that result from this process are unintentional.  Fritzi Mandes M.D    [facility]   CC: Primary care physician; Pcp, NoPatient ID: Mollie Germany, female   DOB: 10/03/1930, 86 y.o.   MRN: [id number]

## 2020-04-08 NOTE — Anesthesia Preprocedure Evaluation (Signed)
Anesthesia Evaluation  Patient identified by MRN, date of birth, ID band Patient awake    Reviewed: Allergy & Precautions, H&P , NPO status , Patient's Chart, lab work & pertinent test results  History of Anesthesia Complications Negative for: history of anesthetic complications  Airway Mallampati: III  TM Distance: >3 FB     Dental  (+) Chipped   Pulmonary asthma , neg sleep apnea, COPD, former smoker,     + decreased breath sounds      Cardiovascular hypertension, (-) angina(-) Past MI and (-) Cardiac Stents (-) dysrhythmias  Rhythm:regular Rate:Normal     Neuro/Psych PSYCHIATRIC DISORDERS Anxiety Depression Dementia negative neurological ROS     GI/Hepatic Neg liver ROS, PUD, GERD  ,  Endo/Other  diabetes  Renal/GU      Musculoskeletal   Abdominal   Peds  Hematology  (+) Blood dyscrasia, anemia ,   Anesthesia Other Findings Past Medical History: No date: Anemia No date: Chronic obstructive asthma (HCC) No date: COPD (chronic obstructive pulmonary disease) (HCC) No date: Depression No date: Diabetes mellitus without complication (HCC) No date: Femoral bruit No date: GERD without esophagitis No date: Hypertension No date: Hypertension No date: Lumbago No date: Neuropathy No date: Osteoarthritis No date: Psoriasis No date: Tremor No date: Ulcerative colitis ([place]) No date: Vertigo  Past Surgical History: No date: ABDOMINAL HYSTERECTOMY No date: APPENDECTOMY 07/16/2018: ESOPHAGOGASTRODUODENOSCOPY (EGD) WITH PROPOFOL; N/A     Comment:  Procedure: ESOPHAGOGASTRODUODENOSCOPY (EGD) WITH               PROPOFOL;  Surgeon: [name], MD;  Location: [facility]               ENDOSCOPY;  Service: Endoscopy;  Laterality: N/A; No date: EYE SURGERY  BMI    Body Mass Index: 30.52 kg/m      Reproductive/Obstetrics negative OB ROS                             Anesthesia Physical Anesthesia  Plan  ASA: III  Anesthesia Plan: Spinal   Post-op Pain Management:    Induction:   PONV Risk Score and Plan: Propofol infusion and Ondansetron  Airway Management Planned:   Additional Equipment:   Intra-op Plan:   Post-operative Plan:   Informed Consent: I have reviewed the patients History and Physical, chart, labs and discussed the procedure including the risks, benefits and alternatives for the proposed anesthesia with the patient or authorized representative who has indicated his/her understanding and acceptance.     Dental Advisory Given  Plan Discussed with: Anesthesiologist, CRNA and Surgeon  Anesthesia Plan Comments: (Consent obtained from daughter.  KR)        Anesthesia Quick Evaluation

## 2020-04-08 NOTE — Evaluation (Signed)
Physical Therapy Evaluation Patient Details Name: Chelsea [name] MRN: [id number] DOB: 1930/09/30 Today's Date: 04/08/2020   History of Present Illness  Pt is an 86 y.o. female presenting to hospital 11/2 with persistent L hip pain s/p fall about 1 week ago (caretaker pushing pt in w/c and pt slid to L side and fell).  Pt s/p 11/3 percutaneous pinning L valgus impacted femoral neck fx.  PMH includes dementia, 3 L O2 baseline secondary COPD, asthma, anemia, DM, htn, neuropathy, OA, tremor, psoriasis, vertigo.  Clinical Impression  Prior to hospital admission, pt required assist at times with bed mobility and always had assist with w/c level transfers (sometimes pt propelled self in w/c but otherwise got pushed in w/c); non-ambulatory; LTC resident at [facility].  Currently pt is mod assist semi-supine to sitting edge of bed and mod assist x2 for stand pivot transfer towards R bed to recliner.  Extra time required during session (during ex's and mobility) d/t L hip/thigh pain (0/10 at rest but 8/10 with activity).  Pt would benefit from skilled PT to address noted impairments and functional limitations (see below for any additional details).  Upon hospital discharge, pt would benefit from home health PT at LTC facility and 24/7 assist.    Follow Up Recommendations Home health PT;Supervision/Assistance - 24 hour (at LTC facility)    Equipment Recommendations  Other (comment) (pt reports having w/c at facility already)    Recommendations for Other Services OT consult     Precautions / Restrictions Precautions Precautions: Fall Restrictions Weight Bearing Restrictions: Yes LLE Weight Bearing: Weight bearing as tolerated      Mobility  Bed Mobility Overal bed mobility: Needs Assistance Bed Mobility: Supine to Sit     Supine to sit: Mod assist;HOB elevated     General bed mobility comments: assist for L>R LE and trunk; vc's for technique; increased effort/time to perform d/t L  hip/thigh pain    Transfers Overall transfer level: Needs assistance   Transfers: Stand Pivot Transfers   Stand pivot transfers: Mod assist;+2 physical assistance       General transfer comment: stand pivot towards R side bed to recliner with mod assist x2 and vc's for technique  Ambulation/Gait             General Gait Details: Deferred (pt non-ambulatory)  Stairs            Wheelchair Mobility    Modified Rankin (Stroke Patients Only)       Balance Overall balance assessment: Needs assistance Sitting-balance support: No upper extremity supported;Feet supported Sitting balance-Leahy Scale: Good Sitting balance - Comments: steady sitting reaching within BOS   Standing balance support: Bilateral upper extremity supported Standing balance-Leahy Scale: Poor Standing balance comment: pt requiring B UE support for static standing balance                             Pertinent Vitals/Pain Pain Assessment: 0-10 Faces Pain Scale: No hurt (0/10 at rest; 8/10 with activity) Pain Descriptors / Indicators: Aching;Grimacing;Guarding;Discomfort;Tender;Sore Pain Intervention(s): Limited activity within patient's tolerance;Monitored during session;Premedicated before session;Repositioned  Vitals (HR and O2 on 2 L via nasal cannula) stable and WFL throughout treatment session.    Home Living Family/patient expects to be discharged to:: Skilled nursing facility                 Additional Comments: Long term care resident at [facility]    Prior Function Level of  Independence: Needs assistance   Gait / Transfers Assistance Needed: Pt reports needing assist some with bed mobility and always with transfers to w/c.  Pt reports being non-ambulatory (does propel w/c on own at times).           Hand Dominance        Extremity/Trunk Assessment   Upper Extremity Assessment Upper Extremity Assessment: Generalized weakness    Lower Extremity  Assessment Lower Extremity Assessment: LLE deficits/detail (R LE WFL) LLE Deficits / Details: fair L quad set strength; at least 3/5 AROM ankle DF/PF LLE: Unable to fully assess due to pain    Cervical / Trunk Assessment Cervical / Trunk Assessment: Normal  Communication   Communication: No difficulties  Cognition Arousal/Alertness: Awake/alert Behavior During Therapy: WFL for tasks assessed/performed Overall Cognitive Status: No family/caregiver present to determine baseline cognitive functioning                                 General Comments: Oriented to person, place, general time, and general situation      General Comments   Nursing cleared pt for participation in physical therapy.  Pt agreeable to PT session.    Exercises Total Joint Exercises Ankle Circles/Pumps: AROM;Strengthening;Both;10 reps;Supine Quad Sets: AROM;Strengthening;Both;10 reps;Supine Short Arc Quad: AAROM;Strengthening;Left;10 reps;Supine Heel Slides: AAROM;Strengthening;Left;10 reps;Supine Hip ABduction/ADduction: AAROM;Strengthening;Left;10 reps;Supine   Assessment/Plan    PT Assessment Patient needs continued PT services  PT Problem List Decreased strength;Decreased activity tolerance;Decreased balance;Decreased mobility;Decreased knowledge of use of DME;Decreased knowledge of precautions;Pain;Decreased skin integrity       PT Treatment Interventions DME instruction;Functional mobility training;Therapeutic activities;Therapeutic exercise;Balance training;Patient/family education    PT Goals (Current goals can be found in the Care Plan section)  Acute Rehab PT Goals Patient Stated Goal: to improve pain PT Goal Formulation: With patient Time For Goal Achievement: 04/22/20 Potential to Achieve Goals: Fair    Frequency BID   Barriers to discharge        Co-evaluation               AM-PAC PT "6 Clicks" Mobility  Outcome Measure Help needed turning from your back to  your side while in a flat bed without using bedrails?: A Little Help needed moving from lying on your back to sitting on the side of a flat bed without using bedrails?: A Lot Help needed moving to and from a bed to a chair (including a wheelchair)?: Total Help needed standing up from a chair using your arms (e.g., wheelchair or bedside chair)?: Total Help needed to walk in hospital room?: Total Help needed climbing 3-5 steps with a railing? : Total 6 Click Score: 9    End of Session Equipment Utilized During Treatment: Gait belt Activity Tolerance: Patient limited by pain Patient left: in chair;with call bell/phone within reach;with chair alarm set;with nursing/sitter in room;with SCD's reapplied;Other (comment) (B heels floating via pillow support) Nurse Communication: Mobility status;Precautions;Weight bearing status PT Visit Diagnosis: Other abnormalities of gait and mobility (R26.89);Muscle weakness (generalized) (M62.81);History of falling (Z91.81);Pain Pain - Right/Left: Left Pain - part of body: Hip    Time: 1829-9371 PT Time Calculation (min) (ACUTE ONLY): 36 min   Charges:   PT Evaluation $PT Eval Low Complexity: 1 Low PT Treatments $Therapeutic Exercise: 8-22 mins $Therapeutic Activity: 8-22 mins       Leitha Bleak, PT 04/08/20, 1:53 PM

## 2020-04-08 NOTE — Anesthesia Postprocedure Evaluation (Signed)
Anesthesia Post Note  Patient: Theatre stage manager  Procedure(s) Performed: CANNULATED HIP PINNING-Left Hip Hemi (Left Hip)  Patient location during evaluation: Nursing Unit Anesthesia Type: Spinal Level of consciousness: oriented and awake and alert Pain management: pain level controlled Vital Signs Assessment: post-procedure vital signs reviewed and stable Respiratory status: spontaneous breathing and respiratory function stable Cardiovascular status: blood pressure returned to baseline and stable Postop Assessment: no headache, no backache, no apparent nausea or vomiting and patient able to bend at knees Anesthetic complications: no   No complications documented.   Last Vitals:  Vitals:   04/07/20 2327 04/08/20 0420  BP: 110/65 112/60  Pulse: 83 71  Resp: 16 16  Temp: 37 C 37.2 C  SpO2: 94% 93%    Last Pain:  Vitals:   04/08/20 0420  TempSrc: Oral  PainSc:                  Alison Stalling

## 2020-04-08 NOTE — Evaluation (Signed)
Occupational Therapy Evaluation Patient Details Name: Chelsea [name] MRN: [id number] DOB: 1930-10-11 Today's Date: 04/08/2020    History of Present Illness Pt is an 86 y.o. female presenting to hospital 11/2 with persistent L hip pain s/p fall about 1 week ago (caretaker pushing pt in w/c and pt slid to L side and fell).  Pt s/p 11/3 percutaneous pinning L valgus impacted femoral neck fx.  PMH includes dementia, 3 L O2 baseline secondary COPD, asthma, anemia, DM, htn, neuropathy, OA, tremor, psoriasis, vertigo.   Clinical Impression   During today's OT evaluation, Chelsea [name] is alert, pleasant, able to state that she is at [facility], oriented to situation generally, but unable to identify date or year or to recall the name of the facility where she lives. Her daughter is present in room. Per daughter, Chelsea [name] has been unable to bear weight on BLE and has been using a wheelchair for mobility for many years. She has been living at [facility], where she reports participating actively in social events, especially crafts and games, stating that she is known as the "[employer] Chelsea [name] has had at least 6 falls in the past 6 months. The majority of these falls have happened as she has attempted to transfer from toilet to [facility] w/o an assist; some have happened from sliding out of the WC. Upon evaluation, Chelsea [name] requires [place] for bed mobility; following set-up, she is IND in UB grooming and bathing while seated. She reports 8/10 pain in L hip. Nurse notified. Recommend ongoing OT while pt is hospitalized. Discharge will be back to [facility]; recommend that Chelsea [name] participate in OT upon return to the facility, to improve ability to transfer safely, to facilitate pain mgmt strategies, to enhance functional mobility in [facility], and to minimize falls risk. Pt expressed reluctance to engage with therapy, stating that she believed it would cause her pain. Therapist provided education re: importance of safe transfers and reducing  falls.    Follow Up Recommendations  SNF    Equipment Recommendations  None recommended by OT    Recommendations for Other Services       Precautions / Restrictions Precautions Precautions: Fall Restrictions Weight Bearing Restrictions: Yes LLE Weight Bearing: Weight bearing as tolerated      Mobility Bed Mobility Overal bed mobility: Needs Assistance Bed Mobility: Supine to Sit     Supine to sit: Mod assist     General bed mobility comments: increased time/effort 2/2 L hip pain; Mod A for L LE movement    Transfers Overall transfer level: Needs assistance   Transfers: Stand Pivot Transfers   Stand pivot transfers: Mod assist;+2 physical assistance       General transfer comment: not attempted, secondary to 8/10 pain    Balance Overall balance assessment: Needs assistance Sitting-balance support: No upper extremity supported;Feet supported Sitting balance-Leahy Scale: Good Sitting balance - Comments: steady sitting reaching within BOS   Standing balance support: Bilateral upper extremity supported Standing balance-Leahy Scale: Poor Standing balance comment: pt requiring B UE support for static standing balance                           ADL either performed or assessed with clinical judgement   ADL Overall ADL's : Needs assistance/impaired     Grooming: Set up;Modified independent   Upper Body Bathing: Set up;Modified independent  General ADL Comments: Pt non-weightbearing BLE at baseline, living at SNF, with all IADL completed by staff; +1 assist for bathing, toileting, [place]     Vision Patient Visual Report: No change from baseline       Perception     Praxis      Pertinent Vitals/Pain Pain Assessment: 0-10 Pain Score: 8  Faces Pain Scale: No hurt (0/10 at rest; 8/10 with activity) Pain Location: L hip Pain Descriptors / Indicators:  Aching;Grimacing;Guarding;Discomfort;Tender;Sore Pain Intervention(s): Limited activity within patient's tolerance;Monitored during session;Ice applied;Patient requesting pain meds-RN notified     Hand Dominance     Extremity/Trunk Assessment Upper Extremity Assessment Upper Extremity Assessment: Generalized weakness   Lower Extremity Assessment Lower Extremity Assessment: Generalized weakness LLE Deficits / Details: fair L quad set strength; at least 3/5 AROM ankle DF/PF LLE: Unable to fully assess due to pain   Cervical / Trunk Assessment Cervical / Trunk Assessment: Normal   Communication Communication Communication: No difficulties   Cognition Arousal/Alertness: Awake/alert Behavior During Therapy: WFL for tasks assessed/performed Overall Cognitive Status: Within Functional Limits for tasks assessed                                 General Comments: Orientated to person and place, unable to state date/year   General Comments       Exercises Total Joint Exercises Ankle Circles/Pumps: AROM;Strengthening;Both;10 reps;Supine Quad Sets: AROM;Strengthening;Both;10 reps;Supine Short Arc Quad: AAROM;Strengthening;Left;10 reps;Supine Heel Slides: AAROM;Strengthening;Left;10 reps;Supine Hip ABduction/ADduction: AAROM;Strengthening;Left;10 reps;Supine Other Exercises Other Exercises: Educ re: role of OT, falls prevention, OOB activity, safety precautions   Shoulder Instructions      Home Living Family/patient expects to be discharged to:: Skilled nursing facility Living Arrangements: Other (Comment)                               Additional Comments: Long term care resident at [facility]      Prior Functioning/Environment Level of Independence: Needs assistance  Gait / Transfers Assistance Needed: Pt reports needing +1 assist some with bed mobility and always with transfers to w/c.  Pt reports being non-ambulatory (does propel w/c on own at  times).     Comments: WC as primarily mobility, using BUE/BLE to self-propel manual chair; SPT (holding armrests) with +1 assist from transfers to/from seating surfaces        OT Problem List: Decreased strength;Impaired balance (sitting and/or standing);Decreased activity tolerance;Decreased safety awareness;Pain      OT Treatment/Interventions: Therapeutic exercise;Patient/family education;Self-care/ADL training;DME and/or AE instruction;Therapeutic activities;Balance training;Cognitive remediation/compensation    OT Goals(Current goals can be found in the care plan section) Acute Rehab OT Goals Patient Stated Goal: to improve pain OT Goal Formulation: With patient Time For Goal Achievement: 04/22/20 Potential to Achieve Goals: Good ADL Goals Pt Will Perform Lower Body Dressing: with mod assist;sitting/lateral leans (observing safety precautions) Pt Will Transfer to Toilet: with mod assist;grab bars (using LRAD and observing safety precautions) Pt Will Perform Toileting - Clothing Manipulation and hygiene: with mod assist;sitting/lateral leans (observing safety precautions)  OT Frequency: Min 1X/week   Barriers to D/C:            Co-evaluation              AM-PAC OT "6 Clicks" Daily Activity     Outcome Measure Help from another person eating meals?: None Help from another person taking care of personal  grooming?: None Help from another person toileting, which includes using toliet, bedpan, or urinal?: A Lot Help from another person bathing (including washing, rinsing, drying)?: A Little Help from another person to put on and taking off regular upper body clothing?: None Help from another person to put on and taking off regular lower body clothing?: A Lot 6 Click Score: 19   End of Session Nurse Communication: Patient requests pain meds  Activity Tolerance: Patient tolerated treatment well Patient left: in chair;with call bell/phone within reach;with family/visitor  present;with chair alarm set  OT Visit Diagnosis: Muscle weakness (generalized) (M62.81);History of falling (Z91.81);Repeated falls (R29.6);Other abnormalities of gait and mobility (R26.89);Unsteadiness on feet (R26.81)                Time: 1424-1440 OT Time Calculation (min): 16 min Charges:  OT General Charges $OT Visit: 1 Visit OT Evaluation $OT Eval Low Complexity: 1 Low OT Treatments $Self Care/Home Management : 8-22 mins  Josiah Lobo, PhD, MS, OTR/L ascom [phone] 04/08/20, 3:05 PM

## 2020-04-09 DIAGNOSIS — J449 Chronic obstructive pulmonary disease, unspecified: Secondary | ICD-10-CM | POA: Diagnosis not present

## 2020-04-09 DIAGNOSIS — N179 Acute kidney failure, unspecified: Secondary | ICD-10-CM | POA: Diagnosis not present

## 2020-04-09 DIAGNOSIS — S72002A Fracture of unspecified part of neck of left femur, initial encounter for closed fracture: Secondary | ICD-10-CM | POA: Diagnosis not present

## 2020-04-09 DIAGNOSIS — F0390 Unspecified dementia without behavioral disturbance: Secondary | ICD-10-CM | POA: Diagnosis not present

## 2020-04-09 LAB — BASIC METABOLIC PANEL
Anion gap: 12 (ref 5–15)
BUN: 14 mg/dL (ref 8–23)
CO2: 27 mmol/L (ref 22–32)
Calcium: 9.5 mg/dL (ref 8.9–10.3)
Chloride: 92 mmol/L — ABNORMAL LOW (ref 98–111)
Creatinine, Ser: 0.97 mg/dL (ref 0.44–1.00)
GFR, Estimated: 57 mL/min — ABNORMAL LOW (ref >60)
Glucose, Bld: 140 mg/dL — ABNORMAL HIGH (ref 70–99)
Potassium: 4.4 mmol/L (ref 3.5–5.1)
Sodium: 131 mmol/L — ABNORMAL LOW (ref 135–145)

## 2020-04-09 LAB — CBC
HCT: 29.7 % — ABNORMAL LOW (ref 36.0–46.0)
Hemoglobin: 10 g/dL — ABNORMAL LOW (ref 12.0–15.0)
MCH: 30.8 pg (ref 26.0–34.0)
MCHC: 33.7 g/dL (ref 30.0–36.0)
MCV: 91.4 fL (ref 80.0–100.0)
Platelets: 269 10*3/uL (ref 150–400)
RBC: 3.25 MIL/uL — ABNORMAL LOW (ref 3.87–5.11)
RDW: 12.9 % (ref 11.5–15.5)
WBC: 9.9 10*3/uL (ref 4.0–10.5)
nRBC: 0 % (ref 0.0–0.2)

## 2020-04-09 LAB — RESPIRATORY PANEL BY RT PCR (FLU A&B, COVID)
Influenza A by PCR: NEGATIVE
Influenza B by PCR: NEGATIVE
SARS Coronavirus 2 by RT PCR: NEGATIVE

## 2020-04-09 LAB — GLUCOSE, CAPILLARY
Glucose-Capillary: 125 mg/dL — ABNORMAL HIGH (ref 70–99)
Glucose-Capillary: 179 mg/dL — ABNORMAL HIGH (ref 70–99)

## 2020-04-09 MED ORDER — ENOXAPARIN SODIUM 300 MG/3ML IJ SOLN
0.5000 mg/kg | INTRAMUSCULAR | Status: DC
  Filled 2020-04-09: qty 0.4

## 2020-04-09 MED ORDER — LOSARTAN POTASSIUM 50 MG PO TABS: 25.0000 mg | ORAL_TABLET | Freq: Every day | ORAL | 0 refills | Status: DC

## 2020-04-09 MED ORDER — ENSURE ENLIVE PO LIQD
237.0000 mL | Freq: Two times a day (BID) | ORAL | 12 refills | Status: DC
Start: 2020-04-09 — End: 2022-10-13

## 2020-04-09 MED ORDER — ENOXAPARIN SODIUM 40 MG/0.4ML ~~LOC~~ SOLN
40.0000 mg | SUBCUTANEOUS | Status: DC
  Administered 2020-04-09: 40 mg via SUBCUTANEOUS
  Filled 2020-04-09: qty 0.4

## 2020-04-09 MED ORDER — BISACODYL 10 MG RE SUPP
10.0000 mg | Freq: Once | RECTAL | Status: AC
  Administered 2020-04-09: 10 mg via RECTAL

## 2020-04-09 NOTE — Progress Notes (Addendum)
PHARMACIST - PHYSICIAN COMMUNICATION  CONCERNING:  Enoxaparin (Lovenox) for DVT Prophylaxis    RECOMMENDATION: Patient was prescribed enoxaprin 60m q24 hours for VTE prophylaxis.   Filed Weights   04/07/20 1315  Weight: 75.7 kg (166 lb 14.2 oz)    Body mass index is 30.52 kg/m.  Estimated Creatinine Clearance: 38.9 mL/min (by C-G formula based on SCr of 0.97 mg/dL).   [place] for enoxaparin 0.580mkg TBW SQ every 24 hours based on BMI being >30. Patients renal function is improving (Scr 1.27 > 0.97).    DESCRIPTION: Pharmacy has [facility]arin dose [facility].  Patient is now receiving enoxaparin 0.5 mg/kg (rounded to Lovenox 40 mg) every 24 hours.   AsRowland LathePharmD Clinical Pharmacist  04/09/2020 8:59 AM

## 2020-04-09 NOTE — Progress Notes (Signed)
  Subjective: 2 Days Post-Op Procedure(s) (LRB): CANNULATED HIP PINNING-Left Hip Hemi (Left) Patient reports pain as mild.   Patient is well, and has had no acute complaints or problems Plan is to go home versus Rehab after hospital stay. Negative for chest pain and shortness of breath Fever: no Gastrointestinal: Negative for nausea and vomiting  Objective: Vital signs in last 24 hours: Temp:  [98.3 F (36.8 C)-98.7 F (37.1 C)] 98.7 F (37.1 C) (11/05 0453) Pulse Rate:  [65-83] 78 (11/05 0453) Resp:  [15-17] 17 (11/05 0453) BP: (125-175)/(56-75) 154/69 (11/05 0453) SpO2:  [93 %-100 %] 99 % (11/05 0453)  Intake/Output from previous day:  Intake/Output Summary (Last 24 hours) at 04/09/2020 0648 Last data filed at 04/09/2020 0453 Gross per 24 hour  Intake 638.32 ml  Output 1300 ml  Net -661.68 ml    Intake/Output this shift: Total I/O In: -  Out: 1300 [Urine:1300]  Labs: Recent Labs    04/06/20 1643 04/08/20 0608 04/09/20 0532  HGB 11.5* 10.0* 10.0*   Recent Labs    04/08/20 0608 04/09/20 0532  WBC 8.7 9.9  RBC 3.21* 3.25*  HCT 30.5* 29.7*  PLT 294 269   Recent Labs    04/08/20 0608 04/09/20 0532  NA 133* 131*  K 4.1 4.4  CL 96* 92*  CO2 29 27  BUN 22 14  CREATININE 1.27* 0.97  GLUCOSE 120* 140*  CALCIUM 8.7* 9.5   Recent Labs    04/06/20 1847  INR 1.1     EXAM General - Patient is Alert and Oriented Extremity - Neurovascular intact Sensation intact distally Dorsiflexion/Plantar flexion intact Compartment soft Dressing/Incision - clean, dry, no drainage Motor Function - intact, moving foot and toes well on exam.   Past Medical History:  Diagnosis Date  . Anemia   . Chronic obstructive asthma ([place])   . COPD (chronic obstructive pulmonary disease) ([place])   . Depression   . Diabetes mellitus without complication ([place])   . Femoral bruit   . GERD without esophagitis   . Hypertension   . Hypertension   . Lumbago   . Neuropathy   .  Osteoarthritis   . Psoriasis   . Tremor   . Ulcerative colitis ([place])   . Vertigo     Assessment/Plan: 2 Days Post-Op Procedure(s) (LRB): CANNULATED HIP PINNING-Left Hip Hemi (Left) Principal Problem:   Closed fracture of left hip (HCC) Active Problems:   Chronic obstructive pulmonary disease (COPD) (HCC)   Diabetes mellitus type 2, noninsulin dependent (HCC)   Essential hypertension, benign   Vascular dementia with behavior disturbance (HCC)   Neuropathy due to type 2 diabetes mellitus (HCC)   Closed left hip fracture (HCC)   Acute kidney injury superimposed on CKD ([place])   Dementia without behavioral disturbance (HCC)  Estimated body mass index is 30.52 kg/m as calculated from the following:   Height as of this encounter: 5' 2"  (1.575 m).   Weight as of this encounter: 75.7 kg. Advance diet Up with therapy D/C IV fluids  Follow-up at [facility] clinic orthopedics in 2 weeks for staple removal.  DVT Prophylaxis - Lovenox, Foot Pumps and TED hose Weight-Bearing as tolerated to left leg  Reche Dixon, PA-C Orthopaedic Surgery 04/09/2020, 6:48 AM

## 2020-04-09 NOTE — Progress Notes (Signed)
Physical Therapy Treatment Patient Details Name: Chelsea [name] MRN: [id number] DOB: 1931/05/16 Today's Date: 04/09/2020    History of Present Illness Pt is an 86 y.o. female presenting to hospital 11/2 with persistent L hip pain s/p fall about 1 week ago (caretaker pushing pt in w/c and pt slid to L side and fell).  Pt s/p 11/3 percutaneous pinning L valgus impacted femoral neck fx.  PMH includes dementia, 3 L O2 baseline secondary COPD, asthma, anemia, DM, htn, neuropathy, OA, tremor, psoriasis, vertigo.    PT Comments    Author contacted by RN staff with pt requesting to get OOB to eat breakfast. Upon entering room, pt was long sitting in bed. Is Alert, but does present with some baseline cognition deficits. Follows commands consistently throughout. Required mod assist to exit R side of bed. Max assist to stand pivot to recliner. Overall tolerated well. She was sitting in recliner with call bell in reach and chair alarm in place.   Follow Up Recommendations  Home health PT;Supervision/Assistance - 24 hour;Other (comment) (return to LTC [facility]     Equipment Recommendations  Other (comment) (defer to next level of care)    Recommendations for Other Services       Precautions / Restrictions Precautions Precautions: Fall    Mobility  Bed Mobility Overal bed mobility: Needs Assistance Bed Mobility: Supine to Sit     Supine to sit: Mod assist     General bed mobility comments: Increased time to perform with vcs throughout for technique, sequencing, and safety  Transfers Overall transfer level: Needs assistance Equipment used: None Transfers: Stand Pivot Transfers   Stand pivot transfers: Max assist;From elevated surface;+2 safety/equipment       General transfer comment: max assist to stand and pivot to recliner from EOB. pt was able to progress BLEs with assisted lateral wt shift  Ambulation/Gait      General Gait Details: unsafe/ unwilling 2/2 to  pain       Balance Overall balance assessment: Needs assistance Sitting-balance support: No upper extremity supported;Feet supported Sitting balance-Leahy Scale: Good Sitting balance - Comments: pt sat EOB x > 5 minus without physical assistance required.   Standing balance support: Bilateral upper extremity supported;During functional activity Standing balance-Leahy Scale: Poor       Cognition Arousal/Alertness: Awake/alert Behavior During Therapy: WFL for tasks assessed/performed Overall Cognitive Status: Within Functional Limits for tasks assessed      General Comments: Oriented to person, place, and general situation      Exercises Total Joint Exercises Ankle Circles/Pumps: AROM;Strengthening;Both;10 reps;Supine Quad Sets: AROM;Strengthening;Both;10 reps;Supine Short Arc Quad: AAROM;Strengthening;Left;10 reps;Supine Heel Slides: AAROM;Strengthening;Left;10 reps;Supine Hip ABduction/ADduction: AAROM;Strengthening;Left;10 reps;Supine        Pertinent Vitals/Pain Faces Pain Scale: Hurts little more Pain Location: L hip Pain Descriptors / Indicators: Aching;Grimacing;Guarding;Discomfort;Tender;Sore           PT Goals (current goals can now be found in the care plan section) Acute Rehab PT Goals Patient Stated Goal: to improve pain    Frequency    BID      PT Plan Current plan remains appropriate       AM-PAC PT "6 Clicks" Mobility   Outcome Measure  Help needed turning from your back to your side while in a flat bed without using bedrails?: A Little Help needed moving from lying on your back to sitting on the side of a flat bed without using bedrails?: A Lot Help needed moving to and from a bed to  a chair (including a wheelchair)?: A Lot Help needed standing up from a chair using your arms (e.g., wheelchair or bedside chair)?: A Lot Help needed to walk in hospital room?: Total Help needed climbing 3-5 steps with a railing? : Total 6 Click Score:  11    End of Session Equipment Utilized During Treatment: Gait belt;Oxygen (2L O2 throughout session) Activity Tolerance: Patient tolerated treatment well;Patient limited by pain Patient left: in chair;with chair alarm set;with family/visitor present;with call bell/phone within reach Nurse Communication: Mobility status;Precautions;Weight bearing status PT Visit Diagnosis: Other abnormalities of gait and mobility (R26.89);Muscle weakness (generalized) (M62.81);History of falling (Z91.81);Pain Pain - Right/Left: Left Pain - part of body: Hip     Time:  -     Charges:                        Julaine Fusi PTA 04/09/20, 1:06 PM

## 2020-04-09 NOTE — Plan of Care (Signed)
  Problem: Education: Goal: Knowledge of General Education information will improve Description: Including pain rating scale, medication(s)/side effects and non-pharmacologic comfort measures Outcome: Progressing   Problem: Health Behavior/Discharge Planning: Goal: Ability to manage health-related needs will improve Outcome: Progressing   Problem: Clinical Measurements: Goal: Ability to maintain clinical measurements within normal limits will improve Outcome: Progressing Goal: Will remain free from infection Outcome: Progressing Goal: Diagnostic test results will improve Outcome: Progressing Goal: Respiratory complications will improve Outcome: Progressing Goal: Cardiovascular complication will be avoided Outcome: Progressing   Problem: Activity: Goal: Risk for activity intolerance will decrease Outcome: Progressing   Problem: Nutrition: Goal: Adequate nutrition will be maintained Outcome: Progressing   Problem: Coping: Goal: Level of anxiety will decrease Outcome: Progressing   Problem: Elimination: Goal: Will not experience complications related to bowel motility Outcome: Progressing Goal: Will not experience complications related to urinary retention Outcome: Progressing   Problem: Pain Managment: Goal: General experience of comfort will improve Outcome: Progressing   Problem: Safety: Goal: Ability to remain free from injury will improve Outcome: Progressing   Problem: Education: Goal: Verbalization of understanding the information provided (i.e., activity precautions, restrictions, etc) will improve Outcome: Progressing Goal: Individualized Educational Video(s) Outcome: Progressing   Problem: Activity: Goal: Ability to ambulate and perform ADLs will improve Outcome: Progressing   Problem: Clinical Measurements: Goal: Postoperative complications will be avoided or minimized Outcome: Progressing   Problem: Self-Concept: Goal: Ability to maintain and  perform role responsibilities to the fullest extent possible will improve Outcome: Progressing   Problem: Pain Management: Goal: Pain level will decrease Outcome: Progressing

## 2020-04-09 NOTE — Care Management Important Message (Signed)
Important Message  Patient Details  Name: Chelsea [name] MRN: [id number] Date of Birth: [birth year]   Medicare Important Message Given:  N/A - LOS <3 / Initial given by admissions     Juliann Pulse A Xaidyn Kepner 04/09/2020, 8:30 AM

## 2020-04-09 NOTE — Discharge Summary (Signed)
[facility] NAME: Chelsea [name]    MR#:  [id number]  DATE OF BIRTH:  [birth year]  DATE OF ADMISSION:  04/06/2020 ADMITTING PHYSICIAN: Para Skeans, MD  DATE OF DISCHARGE: 04/09/2020  PRIMARY CARE PHYSICIAN: Pcp, No    ADMISSION DIAGNOSIS:  AKI (acute kidney injury) ([place]) [N17.9] Closed left hip fracture (HCC) [S72.002A] Closed fracture of left hip, initial encounter ([place]) [S72.002A] Dementia without behavioral disturbance, unspecified dementia type ([place]) [F03.90]  DISCHARGE DIAGNOSIS:  Closed fracture of left hip s/p surgery  SECONDARY DIAGNOSIS:   Past Medical History:  Diagnosis Date  . Anemia   . Chronic obstructive asthma ([place])   . COPD (chronic obstructive pulmonary disease) ([place])   . Depression   . Diabetes mellitus without complication ([place])   . Femoral bruit   . GERD without esophagitis   . Hypertension   . Hypertension   . Lumbago   . Neuropathy   . Osteoarthritis   . Psoriasis   . Tremor   . Ulcerative colitis ([place])   . Vertigo     HOSPITAL COURSE:  Chelsea [name] a 86 y.o.femalewith medical history significant ofDM II, HTN, COPD,UC, seen in ed for fall that happened a week ago and left hip fracture.Per pt her caretaker was pushing her in wheelchair and she slid to left side and fell.  Acute left femoral neck fracture status post percutaneous pending of left femoral neck fracture POD number one -PRN IV and PO pain meds -orthopedic consultation with Dr. Posey Pronto appreciated. -Physical therapy occupational therapy input appreciated. -DVT prophylaxis per ortho  Acute on chronic renal failure suspect prerenal azotemia -received IV fluids--now d/ced -monitor input output, avoid nephrotoxic agents. -Came in with creatinine of 1.56-- 1.2--0.9 Baseline creatinine 1.1  Type II diabetes non-insulin-dependent with neuropathy, sugars well controlled. A1c 6.3 -continue sliding scale -resume metformin  now that sugars are rising   Dementia -chronic -continue Aricept and Cymbalta  COPD -stable -continue inhalers and Claritin  Hypertension continue metoprolol, losartan (creat better)  overall improving and vitals stable. D/c to LTC today   Procedures: left percutaneous femoral neck pinning Family communication :dter Gaspar Skeeters on the phone Consults :ortho CODE STATUS: DNR--confirmed with dter  DVT Prophylaxis : Lovenox  Status is: Inpatient    Dispo: The patient is from: SNF  Anticipated d/c is to: [facility]  Anticipated d/c date is: today  Patient currently is medically stable to d/c. -- postop day 2 patient will discharge  today to her long-term facility. Ortho Dr Ohm Dentler aware (informed)   CONSULTS OBTAINED:  Treatment Team:  Leim Fabry, MD  DRUG ALLERGIES:   Allergies  Allergen Reactions  . Lyrica [Pregabalin] Swelling    DISCHARGE MEDICATIONS:   Allergies as of 04/09/2020      Reactions   Lyrica [pregabalin] Swelling      Medication List    TAKE these medications   acetaminophen 325 MG tablet Commonly known as: TYLENOL Take 650 mg by mouth every 6 (six) hours as needed.   acetaminophen 500 MG tablet Commonly known as: TYLENOL Take 500 mg by mouth in the morning and at bedtime.   albuterol 108 (90 Base) MCG/ACT inhaler Commonly known as: VENTOLIN HFA Inhale 2 puffs into the lungs 2 (two) times daily as needed for wheezing or shortness of breath.   alum & mag hydroxide-simeth 200-200-20 MG/5ML suspension Commonly known as: MAALOX/MYLANTA Take 30 mLs by mouth every 6 (six) hours as needed for indigestion or heartburn.  diphenhydrAMINE 25 mg capsule Commonly known as: BENADRYL Take 25 mg by mouth every 6 (six) hours as needed.   donepezil 10 MG tablet Commonly known as: ARICEPT Take 10 mg by mouth at bedtime.   DULoxetine 60 MG capsule Commonly known as: CYMBALTA Take 120 mg by mouth daily.    enoxaparin 30 MG/0.3ML injection Commonly known as: LOVENOX Inject 0.3 mLs (30 mg total) into the skin daily for 14 days.   feeding supplement (PRO-STAT SUGAR FREE 64) Liqd Take 30 mLs by mouth daily.   feeding supplement Liqd Take 237 mLs by mouth 2 (two) times daily between meals.   ferrous sulfate 325 (65 FE) MG EC tablet Take 1 tablet (325 mg total) by mouth 2 (two) times daily with a meal for 30 days.   Fluticasone-Salmeterol 250-50 MCG/DOSE Aepb Commonly known as: ADVAIR Inhale 1 puff into the lungs 2 (two) times daily.   gabapentin 300 MG capsule Commonly known as: NEURONTIN Take 300 mg by mouth 4 (four) times daily.   guaifenesin 100 MG/5ML syrup Commonly known as: ROBITUSSIN Take 200 mg by mouth every 6 (six) hours as needed for cough.   lamoTRIgine 25 MG tablet Commonly known as: LAMICTAL Take 25 mg by mouth 2 (two) times daily.   loratadine 10 MG tablet Commonly known as: CLARITIN Take 10 mg by mouth daily.   losartan 50 MG tablet Commonly known as: COZAAR Take 0.5 tablets (25 mg total) by mouth daily.   magnesium hydroxide 400 MG/5ML suspension Commonly known as: MILK OF MAGNESIA Take 30 mLs by mouth daily as needed for mild constipation.   magnesium oxide 400 MG tablet Commonly known as: MAG-OX Take 800 mg by mouth 3 (three) times daily.   melatonin 3 MG Tabs tablet Take 1 tablet by mouth at bedtime.   metFORMIN 500 MG 24 hr tablet Commonly known as: GLUCOPHAGE-XR Take 500 mg by mouth daily.   metoprolol tartrate 25 MG tablet Commonly known as: LOPRESSOR Take 12.5 mg by mouth 2 (two) times daily.   multivitamin with minerals tablet Take 1 tablet by mouth daily.   ondansetron 4 MG tablet Commonly known as: ZOFRAN Take 1 tablet (4 mg total) by mouth every 6 (six) hours as needed for nausea.   oxyCODONE 5 MG immediate release tablet Commonly known as: Oxy IR/ROXICODONE Take 0.5-1 tablets (2.5-5 mg total) by mouth every 4 (four) hours as  needed for moderate pain or severe pain (pain score 4-6).   OXYGEN Inhale 2 L/min into the lungs at bedtime.   pantoprazole 40 MG tablet Commonly known as: PROTONIX Take 40 mg by mouth daily.   polyethylene glycol 17 g packet Commonly known as: MIRALAX / GLYCOLAX Take 17 g by mouth daily.   traMADol 50 MG tablet Commonly known as: ULTRAM Take 1 tablet (50 mg total) by mouth every 6 (six) hours as needed for moderate pain. What changed:  when to take this reasons to take this additional instructions   trolamine salicylate 10 % cream Commonly known as: ASPERCREME Apply 1 application topically 4 (four) times daily as needed for muscle pain.            Discharge Care Instructions  (From admission, onward)         Start     Ordered   04/09/20 0000  Discharge wound care:       Comments: Per Ortho reinforce dressing until discontinued   04/09/20 1223          If you  experience worsening of your admission symptoms, develop shortness of breath, life threatening emergency, suicidal or homicidal thoughts you must seek medical attention immediately by calling 911 or calling your MD immediately  if symptoms less severe.  You Must read complete instructions/literature along with all the possible adverse reactions/side effects for all the Medicines you take and that have been prescribed to you. Take any new Medicines after you have completely understood and accept all the possible adverse reactions/side effects.   Please note  You were cared for by a hospitalist during your hospital stay. If you have any questions about your discharge medications or the care you received while you were in the hospital after you are discharged, you can call the unit and asked to speak with the hospitalist on call if the hospitalist that took care of you is not available. Once you are discharged, your primary care physician will handle any further medical issues. Please note that NO REFILLS for any  discharge medications will be authorized once you are discharged, as it is imperative that you return to your primary care physician (or establish a relationship with a primary care physician if you do not have one) for your aftercare needs so that they can reassess your need for medications and monitor your lab values. Today   SUBJECTIVE   No new issues per RN  VITAL SIGNS:  Blood pressure (!) 147/63, pulse 79, temperature 98.4 F (36.9 C), temperature source Oral, resp. rate 17, height 5' 2"  (1.575 m), weight 75.7 kg, SpO2 99 %.  I/O:    Intake/Output Summary (Last 24 hours) at 04/09/2020 1224 Last data filed at 04/09/2020 0453 Gross per 24 hour  Intake 638.32 ml  Output 1300 ml  Net -661.68 ml    PHYSICAL EXAMINATION:  GENERAL:  86 y.o.-year-old patient lying in the bed with no acute distress.  HEENT: Head atraumatic, normocephalic. Oropharynx and nasopharynx clear. Dry oral mucosa  LUNGS: Normal breath sounds bilaterally, no wheezing, rales, rhonchi. No use of accessory muscles of respiration.  CARDIOVASCULAR: S1, S2 normal. No murmurs, rubs, or gallops.  ABDOMEN: Soft, nontender, nondistended. Bowel sounds present. No organomegaly or mass.  EXTREMITIES: No cyanosis, clubbing or edema b/l.   Surgical dressing intact NEUROLOGIC: Cranial nerves II through XII are intact. No focal Motor or sensory deficits b/l.   PSYCHIATRIC:  patient is alert and awake. Has dementia at baseline. SKIN: No obvious rash, lesion, or ulcer.   DATA REVIEW:   CBC  Recent Labs  Lab 04/09/20 0532  WBC 9.9  HGB 10.0*  HCT 29.7*  PLT 269    Chemistries  Recent Labs  Lab 04/06/20 1643 04/08/20 0608 04/09/20 0532  NA 136   < > 131*  K 5.2*   < > 4.4  CL 98   < > 92*  CO2 27   < > 27  GLUCOSE 118*   < > 140*  BUN 19   < > 14  CREATININE 1.56*   < > 0.97  CALCIUM 9.0   < > 9.5  AST 28  --   --   ALT 7  --   --   ALKPHOS 84  --   --   BILITOT 1.3*  --   --    < > = values in this  interval not displayed.    Microbiology Results   Recent Results (from the past 240 hour(s))  Respiratory Panel by RT PCR (Flu A&B, Covid) - Nasopharyngeal Swab  Status: None   Collection Time: 04/06/20  4:42 PM   Specimen: Nasopharyngeal Swab  Result Value Ref Range Status   SARS Coronavirus 2 by RT PCR NEGATIVE NEGATIVE Final    Comment: (NOTE) SARS-CoV-2 target nucleic acids are NOT DETECTED.  The SARS-CoV-2 RNA is generally detectable in upper respiratoy specimens during the acute phase of infection. The lowest concentration of SARS-CoV-2 viral copies this assay can detect is 131 copies/mL. A negative result does not preclude SARS-Cov-2 infection and should not be used as the sole basis for treatment or other patient management decisions. A negative result may occur with  improper specimen collection/handling, submission of specimen other than nasopharyngeal swab, presence of viral mutation(s) within the areas targeted by this assay, and inadequate number of viral copies (<131 copies/mL). A negative result must be combined with clinical observations, patient history, and epidemiological information. The expected result is Negative.  Fact Sheet for Patients:  PinkCheek.be  Fact Sheet for Healthcare Providers:  GravelBags.it  This test is no t yet approved or cleared by the [place] FDA and  has been authorized for detection and/or diagnosis of SARS-CoV-2 by FDA under an Emergency Use Authorization (EUA). This EUA will remain  in effect (meaning this test can be used) for the duration of the COVID-19 declaration under Section 564(b)(1) of the Act, 21 U.S.C. section 360bbb-3(b)(1), unless the authorization is terminated or revoked sooner.     Influenza A by PCR NEGATIVE NEGATIVE Final   Influenza B by PCR NEGATIVE NEGATIVE Final    Comment: (NOTE) The Xpert Xpress SARS-CoV-2/FLU/RSV assay is intended as an  aid in  the diagnosis of influenza from Nasopharyngeal swab specimens and  should not be used as a sole basis for treatment. Nasal washings and  aspirates are unacceptable for Xpert Xpress SARS-CoV-2/FLU/RSV  testing.  Fact Sheet for Patients: PinkCheek.be  Fact Sheet for Healthcare Providers: GravelBags.it  This test is not yet approved or cleared by the [place] FDA and  has been authorized for detection and/or diagnosis of SARS-CoV-2 by  FDA under an Emergency Use Authorization (EUA). This EUA will remain  in effect (meaning this test can be used) for the duration of the  Covid-19 declaration under Section 564(b)(1) of the Act, 21  U.S.C. section 360bbb-3(b)(1), unless the authorization is  terminated or revoked. Performed at [facility], [address] [place], [place] [address]   Surgical PCR screen     Status: Abnormal   Collection Time: 04/06/20  9:06 PM   Specimen: Nasal Mucosa; Nasal Swab  Result Value Ref Range Status   MRSA, PCR POSITIVE (A) NEGATIVE Final    Comment: RESULT CALLED TO, READ BACK BY AND VERIFIED WITH: LINNISE BRONSON @2330  ON 04/06/20 SKL    Staphylococcus aureus POSITIVE (A) NEGATIVE Final    Comment: (NOTE) The Xpert SA Assay (FDA approved for NASAL specimens in patients 55 years of age and older), is one component of a comprehensive surveillance program. It is not intended to diagnose infection nor to guide or monitor treatment. Performed at [facility], [address] [place], [place] [address]     RADIOLOGY:  [place] HIP OPERATIVE UNILAT W OR W/O PELVIS RIGHT  Result Date: 04/07/2020 CLINICAL DATA:  Left hip pinning. EXAM: OPERATIVE left HIP (WITH PELVIS IF PERFORMED) 3 VIEWS TECHNIQUE: Fluoroscopic spot image(s) were submitted for interpretation post-operatively. FLUOROSCOPY TIME:  1 minutes 18 seconds. COMPARISON:  April 06, 2020. FINDINGS: Three  intraoperative fluoroscopic images were obtained of the  left hip. These images demonstrate surgical fixation of proximal left femoral fracture. IMPRESSION: Surgical internal fixation of proximal left femoral fracture. Electronically Signed   By: Marijo Conception M.D.   On: 04/07/2020 15:15     CODE STATUS:     Code Status Orders  (From admission, onward)         Start     Ordered   04/07/20 0912  Do not attempt resuscitation (DNR)  Continuous       Question Answer Comment  In the event of cardiac or respiratory ARREST Do not call a "code blue"   In the event of cardiac or respiratory ARREST Do not perform Intubation, CPR, defibrillation or ACLS   In the event of cardiac or respiratory ARREST Use medication by any route, position, wound care, and other measures to relive pain and suffering. May use oxygen, suction and manual treatment of airway obstruction as needed for comfort.   Comments confiremd with dter Rolly Salter over the phone )HCPOA)      04/07/20 0912        Code Status History    Date Active Date Inactive Code Status Order ID Comments User Context   04/06/2020 1804 04/07/2020 0912 Full Code [id number]  Para Skeans, MD ED   07/15/2018 1945 07/22/2018 1717 DNR [id number]  Demetrios Loll, MD Inpatient   05/22/2016 0048 05/23/2016 1919 DNR [id number]  Lance Coon, MD Inpatient   02/06/2015 2155 02/07/2015 2029 DNR [id number]  Bettey Costa, MD Inpatient   Advance Care Planning Activity       TOTAL TIME TAKING CARE OF THIS PATIENT: *35* minutes.    Fritzi Mandes M.D  [facility]    CC: Primary care physician; Pcp, No

## 2020-04-09 NOTE — Discharge Instructions (Signed)
Patient to follow-up with her PCP in a week at her long-term facility.

## 2021-07-01 ENCOUNTER — Other Ambulatory Visit: Payer: Self-pay

## 2021-07-01 ENCOUNTER — Other Ambulatory Visit: Payer: Medicare Other | Admitting: Primary Care

## 2021-07-01 DIAGNOSIS — F0390 Unspecified dementia without behavioral disturbance: Secondary | ICD-10-CM

## 2021-07-01 DIAGNOSIS — Z515 Encounter for palliative care: Secondary | ICD-10-CM

## 2021-07-01 NOTE — Progress Notes (Signed)
[facility] Pall[facility] Care Consult Note Telephone: [phone]  Fax: [phone]   Date of encounter: 07/01/21 3:48 PM PATIENT NAME: Chelsea [name] [address] [place] [address]   [phone] (home)  DOB: [birth year] MRN: [id number] PRIMARY CARE PROVIDER:    Rica Koyanagi, MD [address] [place],  [place] [address] [phone]   REFERRING PROVIDER:   Rica Koyanagi, MD [address] [place],  [place] [address] [phone]  RESPONSIBLE PARTY:    Contact Information     Name Relation Home Work Mobile   [address] [place] Daughter [phone]         I met face to face with patient  at [facility]. Palliative Care was asked to follow this patient by consultation request of Rica Koyanagi, MD to address advance care planning and complex medical decision making. This is the initial visit.                                    ASSESSMENT AND PLAN / RECOMMENDATIONS:   Advance Care Planning/Goals of Care: Goals include to maximize quality of life and symptom management. Patient/health care surrogate gave his/her permission to discuss.Our advance care planning conversation included a discussion about:     Exploration of personal, cultural or spiritual beliefs that might influence medical decisions  Exploration of goals of care in the event of a sudden injury or illness  Identification of a healthcare agent - daughter Chelsea [name] Review and updating or creation of an  advance directive document . CODE STATUS: DNR  Symptom Management/Plan:   Family endorses patient has been declining with decline in kidney function, uti and increased incontinence. Has had some "shaking fits" according to daughter , who feels it may be seizure like. She has blank stares and shaking spells.  Not been eating, needs protein supplements for improved intake. Needs help with opening it or putting it in a cup.She likes chocolate.   Has PICC line for antibiotics and  fluids, uti is resolving clinically.  Recommend running culture for proof of cure, before PICC is d/ced.  Follow up Palliative Care Visit: Palliative care will continue to follow for complex medical decision making, advance care planning, and clarification of goals. Return 4-6 weeks or prn.  I spent 35 minutes providing this consultation. More than 50% of the time in this consultation was spent in counseling and care coordination.  PPS: 40%  HOSPICE ELIGIBILITY/DIAGNOSIS: TBD  Chief Complaint: debility  HISTORY OF PRESENT ILLNESS:  Chelsea [name] is a 88 y.o. year old female  with h/o debility, dementia, possible seizures, uti.  History obtained from review of EMR, discussion with primary team, and interview with family, facility staff/caregiver and/or Ms. Kelliher.  I reviewed available labs, medications, imaging, studies and related documents from the EMR.  Records reviewed and summarized above.   ROS  General: NAD ENMT: denies dysphagia Cardiovascular: denies chest pain, denies DOE Pulmonary: endorses cough, endorses increased SOB Abdomen: endorses good to fair appetite, denies constipation, endorses incontinence of bowel GU: denies dysuria, endorses incontinence of urine MSK:  denies increased weakness,  no falls reported Skin: denies rashes or wounds Neurological: denies pain, denies insomnia Psych: Endorses positive mood Heme/lymph/immuno: denies bruises, abnormal bleeding  Physical Exam: Current and past weights: stable at 160-162 lbs on chart Constitutional: NAD General: frail appearing,   EYES: anicteric sclera, lids intact, no discharge  ENMT: intact hearing, oral mucous membranes moist,  dentition intact CV: S1S2, RRR, no LE edema Pulmonary: LCTA, no increased work of breathing, no cough, room air (has oxygen for hs) Abdomen: intake 50%,  no ascites GU: deferred MSK: + sarcopenia, moves all extremities, non ambulatory Skin: warm and dry, no rashes or wounds on visible  skin Neuro:  +generalized weakness,  +cognitive impairment Psych: non-anxious affect, A and O x 2 Hem/lymph/immuno: no widespread bruising CURRENT PROBLEM LIST:  Patient Active Problem List   Diagnosis Date Noted   Dementia without behavioral disturbance ([facility])    Closed fracture of left hip ([facility]) 04/06/2020   Closed left hip fracture ([facility]) 04/06/2020   AKI (acute kidney injury) ([facility]) 04/06/2020   Blood in stool    Acute peptic ulcer of stomach    Melena 07/15/2018   GIB (gastrointestinal bleeding) 07/15/2018   External hemorrhoids 07/09/2018   Neuropathy due to type 2 diabetes mellitus ([facility]) 06/29/2018   Felon of finger of right hand 06/29/2018   Vascular dementia with behavior disturbance 05/15/2018   Agitation 05/15/2018   Essential hypertension, benign 03/02/2018   Peripheral neuropathy 03/02/2018   Chronic arthritis 03/02/2018   Depression with anxiety 03/02/2018   Chronic constipation 03/02/2018   Chronic obstructive pulmonary disease (COPD) ([facility]) 05/21/2016   Diabetes mellitus type 2, noninsulin dependent ([facility]) 05/21/2016   Physical deconditioning 05/21/2016   PAST MEDICAL HISTORY:  Active Ambulatory Problems    Diagnosis Date Noted   Chronic obstructive pulmonary disease (COPD) ([facility]) 05/21/2016   Diabetes mellitus type 2, noninsulin dependent ([facility]) 05/21/2016   Physical deconditioning 05/21/2016   Essential hypertension, benign 03/02/2018   Peripheral neuropathy 03/02/2018   Chronic arthritis 03/02/2018   Depression with anxiety 03/02/2018   Chronic constipation 03/02/2018   Vascular dementia with behavior disturbance 05/15/2018   Agitation 05/15/2018   Neuropathy due to type 2 diabetes mellitus ([facility]) 06/29/2018   Felon of finger of right hand 06/29/2018   External hemorrhoids 07/09/2018   Melena 07/15/2018   GIB (gastrointestinal bleeding) 07/15/2018   Blood in stool    Acute peptic ulcer of stomach    Closed fracture of left hip ([facility]) 04/06/2020   Closed  left hip fracture ([facility]) 04/06/2020   AKI (acute kidney injury) ([facility]) 04/06/2020   Dementia without behavioral disturbance ([facility])    Resolved Ambulatory Problems    Diagnosis Date Noted   Sepsis ([facility]) 02/06/2015   Vascular dementia without behavioral disturbance ([facility]) 03/02/2018   Past Medical History:  Diagnosis Date   Anemia    Chronic obstructive asthma (H[facility])    COPD (chronic obstructive pulmonary disease) ([facility])    Depression    Diabetes mellitus without complication (HCC)    Femoral bruit    GERD without esophagitis    Hypertension    Hypertension    Lumbago    Neuropathy    Osteoarthritis    Psoriasis    Tremor    Ulcerative colitis ([facility])    Vertigo    SOCIAL HX:  Social History   Tobacco Use   Smoking status: Former    Types: Cigarettes   Smokeless tobacco: Never  Substance Use Topics   Alcohol use: No   FAMILY HX:  Family History  Problem Relation Age of Onset   Diabetes Mother    Liver cancer Father       ALLERGIES:  Allergies  Allergen Reactions   Lyrica [Pregabalin] Swelling     PERTINENT MEDICATIONS:  Outpatient Encounter Medications as of 07/01/2021  Medication Sig   acetaminophen (TYLENOL) 325 MG  tablet Take 650 mg by mouth every 6 (six) hours as needed.   acetaminophen (TYLENOL) 500 MG tablet Take 500 mg by mouth in the morning and at bedtime.    albuterol (VENTOLIN HFA) 108 (90 Base) MCG/ACT inhaler Inhale 2 puffs into the lungs 2 (two) times daily as needed for wheezing or shortness of breath.   alum & mag hydroxide-simeth (MAALOX/MYLANTA) 200-200-20 MG/5ML suspension Take 30 mLs by mouth every 6 (six) hours as needed for indigestion or heartburn.   Amino Acids-Protein Hydrolys (FEEDING SUPPLEMENT, PRO-STAT SUGAR FREE 64,) LIQD Take 30 mLs by mouth daily.    diphenhydrAMINE (BENADRYL) 25 mg capsule Take 25 mg by mouth every 6 (six) hours as needed.   donepezil (ARICEPT) 10 MG tablet Take 10 mg by mouth at bedtime.   DULoxetine (CYMBALTA) 60 MG  capsule Take 120 mg by mouth daily.    enoxaparin (LOVENOX) 30 MG/0.3ML injection Inject 0.3 mLs (30 mg total) into the skin daily for 14 days.   feeding supplement (ENSURE ENLIVE / ENSURE PLUS) LIQD Take 237 mLs by mouth 2 (two) times daily between meals.   ferrous sulfate 325 (65 FE) MG EC tablet Take 1 tablet (325 mg total) by mouth 2 (two) times daily with a meal for 30 days.   Fluticasone-Salmeterol (ADVAIR) 250-50 MCG/DOSE AEPB Inhale 1 puff into the lungs 2 (two) times daily.    gabapentin (NEURONTIN) 300 MG capsule Take 300 mg by mouth 4 (four) times daily.   guaifenesin (ROBITUSSIN) 100 MG/5ML syrup Take 200 mg by mouth every 6 (six) hours as needed for cough.   lamoTRIgine (LAMICTAL) 25 MG tablet Take 25 mg by mouth 2 (two) times daily.    loratadine (CLARITIN) 10 MG tablet Take 10 mg by mouth daily.    losartan (COZAAR) 50 MG tablet Take 0.5 tablets (25 mg total) by mouth daily.   magnesium hydroxide (MILK OF MAGNESIA) 400 MG/5ML suspension Take 30 mLs by mouth daily as needed for mild constipation.   magnesium oxide (MAG-OX) 400 MG tablet Take 800 mg by mouth 3 (three) times daily.    Melatonin 3 MG TABS Take 1 tablet by mouth at bedtime.   metFORMIN (GLUCOPHAGE-XR) 500 MG 24 hr tablet Take 500 mg by mouth daily.    metoprolol tartrate (LOPRESSOR) 25 MG tablet Take 12.5 mg by mouth 2 (two) times daily.    Multiple Vitamins-Minerals (MULTIVITAMIN WITH MINERALS) tablet Take 1 tablet by mouth daily.   ondansetron (ZOFRAN) 4 MG tablet Take 1 tablet (4 mg total) by mouth every 6 (six) hours as needed for nausea.   oxyCODONE (OXY IR/ROXICODONE) 5 MG immediate release tablet Take 0.5-1 tablets (2.5-5 mg total) by mouth every 4 (four) hours as needed for moderate pain or severe pain (pain score 4-6).   OXYGEN Inhale 2 L/min into the lungs at bedtime.   pantoprazole (PROTONIX) 40 MG tablet Take 40 mg by mouth daily.   polyethylene glycol (MIRALAX / GLYCOLAX) 17 g packet Take 17 g by mouth  daily.   traMADol (ULTRAM) 50 MG tablet Take 1 tablet (50 mg total) by mouth every 6 (six) hours as needed for moderate pain.   trolamine salicylate (ASPERCREME) 10 % cream Apply 1 application topically 4 (four) times daily as needed for muscle pain.   No facility-administered encounter medications on file as of 07/01/2021.   Thank you for the opportunity to participate in the care of Ms. Schorsch.  The palliative care team will continue to follow. Please call our  office at [phone] if we can be of additional assistance.   Jason Coop, NP , DNP, AGPCNP-BC  COVID-19 PATIENT SCREENING TOOL Asked and negative response unless otherwise noted:  Have you had symptoms of covid, tested positive or been in contact with someone with symptoms/positive test in the past 5-10 days?

## 2021-07-20 ENCOUNTER — Other Ambulatory Visit: Payer: Self-pay | Admitting: Nephrology

## 2021-07-20 ENCOUNTER — Other Ambulatory Visit (HOSPITAL_COMMUNITY): Payer: Self-pay | Admitting: Nephrology

## 2021-07-20 DIAGNOSIS — E1122 Type 2 diabetes mellitus with diabetic chronic kidney disease: Secondary | ICD-10-CM

## 2021-07-28 ENCOUNTER — Ambulatory Visit
Admission: RE | Admit: 2021-07-28 | Discharge: 2021-07-28 | Disposition: A | Discharge status: Home / Self Care | Payer: Medicare Other | Source: Ambulatory Visit | Attending: Nephrology | Admitting: Nephrology

## 2021-07-28 DIAGNOSIS — N1832 Chronic kidney disease, stage 3b: Secondary | ICD-10-CM | POA: Diagnosis present

## 2021-07-28 DIAGNOSIS — E1122 Type 2 diabetes mellitus with diabetic chronic kidney disease: Secondary | ICD-10-CM | POA: Insufficient documentation

## 2021-08-11 ENCOUNTER — Other Ambulatory Visit: Payer: Self-pay

## 2021-08-11 ENCOUNTER — Non-Acute Institutional Stay: Payer: Medicare Other | Admitting: Primary Care

## 2021-08-11 DIAGNOSIS — F0390 Unspecified dementia without behavioral disturbance: Secondary | ICD-10-CM

## 2021-08-11 DIAGNOSIS — Z515 Encounter for palliative care: Secondary | ICD-10-CM

## 2021-08-11 NOTE — Progress Notes (Signed)
? ? ?[facility] ?[facility] Palliative Care Consult Note ?Telephone: [phone]  ?Fax: [phone]  ? ? ?Date of encounter: 08/11/21 ?PATIENT NAME: Chelsea [name] ?[address] ?[place] [address]   ?[phone] (home)  ?DOB: Oct 14, 1930 ?MRN: [id number] ?PRIMARY CARE PROVIDER:    ?Rica Koyanagi, MD,  ?[address] ?[place] [address] ?[phone] ? ?REFERRING PROVIDER:   ?Rica Koyanagi, MD ?[address] ?[address] ?[place],  [place] [address] ?[phone] ? ?RESPONSIBLE PARTY:    ?Contact Information   ? ? Name Relation Home Work Mobile  ? Costales,[name] Daughter [phone]    ? Havens,Scott Other   [phone]  ? ?  ? ? ? ?[facility] and later talked to daughter by telephone. Palliative Care was asked to follow this patient by consultation request of  Rica Koyanagi, MD to address advance care planning. ? ?                                 ASSESSMENT AND PLAN / RECOMMENDATIONS:  ? ?Advance Care Planning/Goals of Care: Goals include to maximize quality of life and symptom management. Patient/health care surrogate gave his/her permission to discuss.Our advance care planning conversation included a discussion about:    ?The value and importance of advance care planning  ?Experiences with loved ones who have been seriously ill or have died - Endorses patient misses her husband ?Exploration of personal, cultural or spiritual beliefs that might influence medical decisions  ?Exploration of goals of care in the event of a sudden injury or illness  ?Identification  of a healthcare agent - [name] who is an only child ?Review  of an  advance directive document. ?Decision de-escalate disease focused treatments due to poor prognosis. ?Discussed hospice program. Patient is not yet eligible. Weight is stable around 157 lbs, albumin 3.3. She had some hyponatremia and other cardiac issues to work up as well as renal. Daughter has decided to not  pursue diagnostics as she feels patient would not tolerate interventions  ?Sodium has come back up to WNL and patient is eating 50% of meals.  ?[name] does not want to pursue any diagnostics, procedures or surgeries but focus on symptom management and maximizing QoL. ?CODE STATUS: DNR ? I reviewed a MOST form today. The family outlined their wishes for the following treatment decisions: ? ?Cardiopulmonary Resuscitation: Do Not Attempt Resuscitation (DNR/No CPR)  ?Medical Interventions: Comfort Measures: Keep clean, warm, and dry. Use medication by any route, positioning, wound care, and other measures to relieve pain and suffering. Use oxygen, suction and manual treatment of airway obstruction as needed for comfort. Do not transfer to the hospital unless comfort needs cannot be met in current location.  ?Antibiotics: No antibiotics (use other measures to relieve symptoms)  ?IV Fluids: No IV fluids (provide other measures to ensure comfort)  ?Feeding Tube: No feeding tube  ? ?Follow up Palliative Care Visit: Palliative care will continue to follow for complex medical decision making, advance care planning, and clarification of goals. Return 6 weeks or prn. ? ?I spent 30 minutes providing this consultation. More than 50% of the time in this consultation was spent in counseling and care coordination. ? ?Thank you for the opportunity to participate in the care of Ms. Byas.  The palliative care team will continue to follow. Please call our office at [phone] if we can be of additional assistance.  ? ?  Jason Coop DNP, MPH, AGPCNP-BC, ACHPN ? ? ?COVID-19 PATIENT SCREENING TOOL ?Asked and negative response unless otherwise noted:  ? ?Have you had symptoms of covid, tested positive or been in contact with someone with symptoms/positive test in the past 5-10 days?  ? ?

## 2021-12-06 ENCOUNTER — Non-Acute Institutional Stay: Payer: Medicare Other | Admitting: Primary Care

## 2021-12-06 DIAGNOSIS — F03918 Unspecified dementia, unspecified severity, with other behavioral disturbance: Secondary | ICD-10-CM

## 2021-12-06 DIAGNOSIS — Z515 Encounter for palliative care: Secondary | ICD-10-CM

## 2021-12-06 DIAGNOSIS — R5381 Other malaise: Secondary | ICD-10-CM

## 2021-12-06 NOTE — Progress Notes (Signed)
[facility] Palliative Care Consult Note Telephone: [phone]  Fax: [phone]    Date of encounter: 12/06/21 1:22 PM PATIENT NAME: Chelsea [name] [address] [place] [address]   [phone] (home)  DOB: Apr 13, 1931 MRN: [id number] PRIMARY CARE PROVIDER:    [place]agi, MD,  [address] [place] [address] [phone]  REFERRING PROVIDER:   [place] [address] [address] [place],  [place] [address] [phone]  RESPONSIBLE PARTY:    Contact Information     Name Relation Home Work [place] Daughter [phone]     Havens,Scott Other   [phone]        [facility]. Palliative Care was asked to follow this patient by consultation request of  Rica Koyanagi, MD to address advance care planning and complex medical decision making. This is a follow up visit.                                   ASSESSMENT AND PLAN / RECOMMENDATIONS:   Advance Care Planning/Goals of Care: Goals include to maximize quality of life and symptom management.  CODE STATUS: DNR  Symptom Management/Plan:  I met with patient in her nursing home room. She was eating her lunch. She stated she has been doing very well, and is content most of the time. She said she occasionally can become agitated, mainly by her family per her report. Staff do not report any behavior disturbances. Patient states that she is comfortable and contented and has not had any recent exacerbation of illness. She denies recent falls and general pain.  Her weight is however falling, 8% in 4 months. Recommend to give supplements, assist with eating.  She may benefit from mirtazapine 15 mg nightly as well.  Follow up Palliative Care Visit: Palliative care will continue to follow for complex medical decision making, advance care planning, and clarification of goals. Return 12 weeks or prn.  I spent 15  minutes providing this consultation. More than 50% of the time in this consultation was spent in counseling and care coordination.   PPS: 40%  HOSPICE ELIGIBILITY/DIAGNOSIS: TBD  Chief Complaint: immobility, agitation at times.  HISTORY OF PRESENT ILLNESS:  Chelsea [name] is a 88 y.o. year old female  with dementia, debility, immobility, occ behavior disturbances . Patient seen today to review palliative care needs to include medical decision making and advance care planning as appropriate.   History obtained from review of EMR, discussion with primary team, and interview with family, facility staff/caregiver and/or Ms. Sieh.  I reviewed available labs, medications, imaging, studies and related documents from the EMR.  Records reviewed and summarized above.   ROS [facility]  General: NAD EYES: denies vision changes ENMT: denies dysphagia Pulmonary: denies cough, denies increased SOB Abdomen: endorses good appetite, denies constipation, endorses incontinence of bowel GU: denies dysuria, endorses incontinence of urine MSK:  denies  increased weakness,  no falls reported Skin: denies rashes or wounds Neurological: denies pain, denies insomnia Psych: Endorses positive mood  Physical Exam: Current and past weights: Weight declining, 8% loss in 4 months. Now 133 lbs. Constitutional: NAD General: frail appearing, wnwd EYES: anicteric sclera, lids intact, no discharge  ENMT: intact hearing, oral mucous membranes moist, dentition intact Pulmonary: no increased work of breathing, no cough, supplemental oxygen Abdomen: intake 75%, normo-active BS + 4 quadrants, soft  and non tender, no ascites MSK: mild sarcopenia, moves all extremities, non ambulatory Skin: warm and dry, no rashes or wounds on visible skin Neuro:  no new  generalized weakness,  + cognitive impairment, non-anxious affect  Outpatient Encounter Medications as of 12/06/2021  Medication Sig   acetaminophen (TYLENOL) 325 MG  tablet Take 650 mg by mouth every 4 (four) hours as needed.   albuterol (VENTOLIN HFA) 108 (90 Base) MCG/ACT inhaler Inhale 2 puffs into the lungs 2 (two) times daily as needed for wheezing or shortness of breath.   amitriptyline (ELAVIL) 25 MG tablet Take 25 mg by mouth at bedtime.   Cholecalciferol (VITAMIN D3) 25 MCG (1000 UT) CAPS Take 1 tablet by mouth daily.   donepezil (ARICEPT) 10 MG tablet Take 10 mg by mouth at bedtime.   DULoxetine (CYMBALTA) 60 MG capsule Take 120 mg by mouth daily.    feeding supplement (ENSURE ENLIVE / ENSURE PLUS) LIQD Take 237 mLs by mouth 2 (two) times daily between meals. (Patient taking differently: Take 237 mLs by mouth daily.)   gabapentin (NEURONTIN) 400 MG capsule Take 400 mg by mouth at bedtime.   loratadine (CLARITIN) 10 MG tablet Take 10 mg by mouth daily.    magnesium oxide (MAG-OX) 400 MG tablet Take 800 mg by mouth 2 (two) times daily.   melatonin 3 MG TABS tablet Take 1 tablet by mouth at bedtime.   metoprolol succinate (TOPROL-XL) 25 MG 24 hr tablet Take 12.5 mg by mouth daily.   Multiple Vitamins-Minerals (MULTIVITAMIN WITH MINERALS) tablet Take 1 tablet by mouth daily.   OXYGEN Inhale 2 L/min into the lungs at bedtime.   pantoprazole (PROTONIX) 40 MG tablet Take 40 mg by mouth daily.   polyethylene glycol (MIRALAX / GLYCOLAX) 17 g packet Take 17 g by mouth daily.   [DISCONTINUED] acetaminophen (TYLENOL) 500 MG tablet Take 500 mg by mouth in the morning and at bedtime.   [DISCONTINUED] alum & mag hydroxide-simeth (MAALOX/MYLANTA) 200-200-20 MG/5ML suspension Take 30 mLs by mouth every 6 (six) hours as needed for indigestion or heartburn.   [DISCONTINUED] Amino Acids-Protein Hydrolys (FEEDING SUPPLEMENT, PRO-STAT SUGAR FREE 64,) LIQD Take 30 mLs by mouth daily.    [DISCONTINUED] diphenhydrAMINE (BENADRYL) 25 mg capsule Take 25 mg by mouth every 6 (six) hours as needed.   [DISCONTINUED] enoxaparin (LOVENOX) 30 MG/0.3ML injection Inject 0.3 mLs (30 mg  total) into the skin daily for 14 days.   [DISCONTINUED] ferrous sulfate 325 (65 FE) MG EC tablet Take 1 tablet (325 mg total) by mouth 2 (two) times daily with a meal for 30 days.   [DISCONTINUED] Fluticasone-Salmeterol (ADVAIR) 250-50 MCG/DOSE AEPB Inhale 1 puff into the lungs 2 (two) times daily.    [DISCONTINUED] gabapentin (NEURONTIN) 300 MG capsule Take 300 mg by mouth 4 (four) times daily.   [DISCONTINUED] guaifenesin (ROBITUSSIN) 100 MG/5ML syrup Take 200 mg by mouth every 6 (six) hours as needed for cough.   [DISCONTINUED] lamoTRIgine (LAMICTAL) 25 MG tablet Take 25 mg by mouth 2 (two) times daily.    [DISCONTINUED] losartan (COZAAR) 50 MG tablet Take 0.5 tablets (25 mg total) by mouth daily.   [DISCONTINUED] magnesium hydroxide (MILK OF MAGNESIA) 400 MG/5ML suspension Take 30 mLs by mouth daily as needed for mild constipation.   [DISCONTINUED] Melatonin 3 MG TABS Take 1 tablet by mouth at bedtime.   [DISCONTINUED] metFORMIN (GLUCOPHAGE-XR) 500 MG 24 hr tablet Take 500 mg by mouth daily.    [DISCONTINUED] metoprolol tartrate (LOPRESSOR) 25 MG tablet  Take 12.5 mg by mouth 2 (two) times daily.    [DISCONTINUED] ondansetron (ZOFRAN) 4 MG tablet Take 1 tablet (4 mg total) by mouth every 6 (six) hours as needed for nausea.   [DISCONTINUED] oxyCODONE (OXY IR/ROXICODONE) 5 MG immediate release tablet Take 0.5-1 tablets (2.5-5 mg total) by mouth every 4 (four) hours as needed for moderate pain or severe pain (pain score 4-6).   [DISCONTINUED] traMADol (ULTRAM) 50 MG tablet Take 1 tablet (50 mg total) by mouth every 6 (six) hours as needed for moderate pain.   [DISCONTINUED] trolamine salicylate (ASPERCREME) 10 % cream Apply 1 application topically 4 (four) times daily as needed for muscle pain.   No facility-administered encounter medications on file as of 12/06/2021.     Thank you for the opportunity to participate in the care of Ms. Ziska.  The palliative care team will continue to follow.  Please call our office at [phone] if we can be of additional assistance.   Jason Coop, NP DNP, AGPCNP-BC  COVID-19 PATIENT SCREENING TOOL Asked and negative response unless otherwise noted:   Have you had symptoms of covid, tested positive or been in contact with someone with symptoms/positive test in the past 5-10 days?

## 2022-03-17 ENCOUNTER — Non-Acute Institutional Stay: Payer: Medicare Other | Admitting: Primary Care

## 2022-03-17 DIAGNOSIS — F0390 Unspecified dementia without behavioral disturbance: Secondary | ICD-10-CM

## 2022-03-17 DIAGNOSIS — Z515 Encounter for palliative care: Secondary | ICD-10-CM

## 2022-03-17 NOTE — Progress Notes (Signed)
[facility] Consult Note Telephone: [phone]  Fax: [phone]    Date of encounter: 03/17/22 12:31 PM PATIENT NAME: Chelsea [name] [address] [place] [address]   [phone] (home)  DOB: November 20, 1930 MRN: [id number] PRIMARY CARE PROVIDER:    [place],  [address] [place] [address] [phone]  REFERRING PROVID[place] [address] [address] [place],  [place] [address] [phone]  RESPONSIBLE PARTY:    Contact Information     Name Relation Home Work [place] Daughter [phone]     [name],Chelsea Other   [phone]        I met face to face with [facility] in [facility]. Palliative Care was asked to follow this patient by consultation request of  Chelsea Koyanagi, MD to address advance care planning and complex medical decision making. This is a follow up visit.                                   ASSESSMENT AND PLAN / RECOMMENDATIONS:   Advance Care Planning/Goals of Care: Goals include to maximize quality of life and symptom management.  Identification of a healthcare agent - daughter  CODE STATUS: DNR  Symptom Management/Plan:  Patient in room, applying lipstick. States she used to have long brown hair in her youth. Recounts being oldest of 6 sisters, and their time growing up in [place]. She becomes a bit agitated discussing moving Chelsea [name], which she did not want to do. She endorses feeling well, good appetite and occasionally gets upset with people. She denies pain or other concerns today.  Follow up Palliative Care Visit: Palliative care will continue to follow for complex medical decision making, advance care planning, and clarification of goals. Return 12 weeks or prn.  I spent 15 minutes providing this consultation. More than 50% of the time in this consultation was spent in counseling and care coordination.   PPS: 40%  HOSPICE  ELIGIBILITY/DIAGNOSIS: TBD  Chief Complaint: dementia, debility .  HISTORY OF PRESENT ILLNESS:  Chelsea [name] is a 88 y.o. year old female  with dementia, debility .   History obtained from review of EMR, discussion with primary team, and interview with family, facility staff/caregiver and/or Chelsea [name].  I reviewed available labs, medications, imaging, studies and related documents from the EMR.  Records reviewed and summarized above.   ROS   General: NAD ENMT: denies dysphagia Cardiovascular: denies chest pain, denies DOE Pulmonary: denies cough, denies increased SOB Abdomen: endorses good appetite, denies constipation, endorses incontinence of bowel GU: denies dysuria, endorses incontinence of urine MSK:  denies increased weakness,  no falls reported Skin: denies rashes or wounds Neurological: denies pain, denies insomnia Psych: Endorses positive mood, agitated at times  Heme/lymph/immuno: denies bruises, abnormal bleeding  Physical Exam: Current and past weights:  152 lbs, gained 7 lbs Constitutional: NAD General: frail appearing, WNWD EYES: anicteric sclera, lids intact, no discharge  ENMT: intact hearing, oral mucous membranes moist, edentulous CV:  no LE edema Pulmonary: no increased work of breathing, no cough, room air Abdomen: intake 80%,no ascites GU: deferred MSK: + sarcopenia, moves all extremities,  non ambulatory Skin: warm and dry, no rashes or wounds on visible skin Neuro:  + generalized weakness,  + cognitive impairment, + tongue protrusion, agitated at times Psych: anxious affect, A and O x 2 Hem/lymph/immuno: no widespread bruising  Thank you for the opportunity to participate in the care of Chelsea [name].  The palliative care team will continue to follow. Please call our office at [phone] if we can be of additional assistance.   Chelsea Coop, NP   COVID-19 PATIENT SCREENING TOOL Asked and negative response unless otherwise noted:   Have  you had symptoms of covid, tested positive or been in contact with someone with symptoms/positive test in the past 5-10 days?

## 2022-06-08 ENCOUNTER — Non-Acute Institutional Stay: Payer: Medicare Other | Admitting: Nurse Practitioner

## 2022-06-08 DIAGNOSIS — F0390 Unspecified dementia without behavioral disturbance: Secondary | ICD-10-CM

## 2022-06-08 DIAGNOSIS — R635 Abnormal weight gain: Secondary | ICD-10-CM

## 2022-06-08 DIAGNOSIS — Z515 Encounter for palliative care: Secondary | ICD-10-CM

## 2022-06-08 NOTE — Progress Notes (Signed)
[facility] Consult Note Telephone: [phone]  Fax: [phone]    Date of encounter: 06/08/22 4:30 PM PATIENT NAME: Chelsea [name] [address] [place] [address]   [phone] (home)  DOB: 10/30/1930 MRN: [id number] PRIMARY CARE PROVIDER:    [facility]  RESPONSIBLE PARTY:    Contact Information     Name Relation Home Work [place] Daughter [phone]     Havens,Scott Other   [phone]      I met face to face with patient and family in facility. Palliative Care was asked to follow this patient by consultation request of [facility] to address advance care planning and complex medical decision making. This is a follow up visit.                                  ASSESSMENT AND PLAN / RECOMMENDATIONS:  Symptom Management/Plan: 1. Advance Care Planning;  DNR  Will continue to monitor, follow for progression of dementia, currently stable, appetite with weight gain, symptoms and supportive role.   2. Goals of Care: Goals include to maximize quality of life and symptom management. Our advance care planning conversation included a discussion about:    The value and importance of advance care planning  Exploration of personal, cultural or spiritual beliefs that might influence medical decisions  Exploration of goals of care in the event of a sudden injury or illness  Identification and preparation of a healthcare agent  Review and updating or creation of an advance directive document. 3. Palliative care encounter; Palliative care encounter; Palliative medicine team will continue to support patient, patient's family, and medical team. Visit consisted of counseling and education dealing with the complex and emotionally intense issues of symptom management and palliative care in the setting of serious and potentially life-threatening illness  4. Dementia; progressive; supportive role; monitor for  decline, currently stable. Weight gain; Discussed appetite, weights, snacks, meals. Reviewed weights, continue to monitor, may need to have dietician work with calorie reduction if continues to gain weight 03/17/2022 Weight 152 lbs 06/05/2022 weight 194.4 lbs ? 42.4 weight gain? Will need to f/u with facility to see about accuracy Follow up Palliative Care Visit: Palliative care will continue to follow for complex medical decision making, advance care planning, and clarification of goals. Return 4 to 8 weeks or prn.  I spent 47 minutes providing this consultation. More than 50% of the time in this consultation was spent in counseling and care coordination. PPS: 50% Chief Complaint: Follow up palliative consult for complex medical decision making, address goals, manage ongoing symptoms  HISTORY OF PRESENT ILLNESS:  Chelsea [name] is a 89 y.o. year old female  with multiple medical problems including Vascular Dementia, Htn, COPD, h/o gib, h/o chronic constipation, DM, neuropathy, h/o left hip fx, anxiety, depression. Chelsea [name] resides LTC at [facility], requires assistance with transfers, mobility, though per staff able to do things for herself. Does require prompting for bathing, dressing. Chelsea [name] has been eating with good appetite, current weight reported 194.4 lbs which would be a significant weight gain. No recent falls, wounds, hospitalizations, infections per staff. At present Chelsea [name] is sitting in the w/c in her room, appears comfortable, putting lipstick on. Chelsea [name] was engaging, making eye contact, talking about her room, her daily routine, though limited and forgetful. Remote is more accurate than recent memory. We talked about  ros, functional ability, quality of life, things that bring her joy. We talked about daily though routine though limited with cognitive impairment. Chelsea [name] was cooperative with assessment, Supportive role. I attempted to contact dtg, medical goals, poc, medications  reviewed. Will continue to monitor, follow for progression of dementia, currently stable, appetite with weight gain, symptoms and supportive role.   History obtained from review of EMR, discussion with primary team, and interview with family, facility staff/caregiver and/or Chelsea. [name].  I reviewed available labs, medications, imaging, studies and related documents from the EMR.  Records reviewed and summarized above.  Physical Exam: Constitutional: NAD General: frail appearing, pleasant female ENMT: oral mucous membranes moist CV: S1S2, RRR Pulmonary: LCTA Abdomen: soft and non tender Skin: warm and dry Neuro:  no generalized weakness Psych: non-anxious affect, A and Oriented to self, place Thank you for the opportunity to participate in the care of Chelsea [name]. Please call our office at [phone] if we can be of additional assistance.   Aimar Shrewsbury Ihor Gully, NP

## 2022-07-18 ENCOUNTER — Non-Acute Institutional Stay: Payer: Medicare Other | Admitting: Nurse Practitioner

## 2022-07-18 ENCOUNTER — Encounter: Payer: Self-pay | Admitting: Nurse Practitioner

## 2022-07-18 DIAGNOSIS — F4321 Adjustment disorder with depressed mood: Secondary | ICD-10-CM

## 2022-07-18 DIAGNOSIS — F03918 Unspecified dementia, unspecified severity, with other behavioral disturbance: Secondary | ICD-10-CM

## 2022-07-18 DIAGNOSIS — R634 Abnormal weight loss: Secondary | ICD-10-CM

## 2022-07-18 DIAGNOSIS — Z515 Encounter for palliative care: Secondary | ICD-10-CM

## 2022-07-18 NOTE — Progress Notes (Signed)
[facility] Consult Note Telephone: [phone]  Fax: [phone]    Date of encounter: 07/18/22 1:11 PM PATIENT NAME: Chelsea [name] [address] [place] [address]   [phone] (home)  DOB: [birth year] MRN: [id number] PRIMARY CARE PROVIDER:    [facility]  RESPONSIBLE PARTY:    Contact Information     Name Relation Home Work [place] Daughter [phone]     [name],Chelsea Other   [phone]       I met face to face with patient and family in facility. Palliative Care was asked to follow this patient by consultation request of [facility] to address advance care planning and complex medical decision making. This is a follow up visit.                                  ASSESSMENT AND PLAN / RECOMMENDATIONS:  Symptom Management/Plan: 1. Advance Care Planning;  DNR 2. Goals of Care: Goals include to maximize quality of life and symptom management. Our advance care planning conversation included a discussion about:    The value and importance of advance care planning  Exploration of personal, cultural or spiritual beliefs that might influence medical decisions  Exploration of goals of care in the event of a sudden injury or illness  Identification and preparation of a healthcare agent  Review and updating or creation of an advance directive document. 3. Palliative care encounter; Palliative care encounter; Palliative medicine team will continue to support patient, patient's family, and medical team. Visit consisted of counseling and education dealing with the complex and emotionally intense issues of symptom management and palliative care in the setting of serious and potentially life-threatening illness   4. Dementia/weight loss; progressive; supportive role; monitor for decline; reviewed appetite, weights, snacks, meals. Reviewed weights, continue to monitor, 03/17/2022 Weight 152 lbs 06/23/2022 weight  149.4 lbs Will continue to monitor, follow for progression of dementia, currently stable, appetite with weight gain, symptoms and supportive role.   5. Grief reaction: recent loss of room-mate, discussed at length with Chelsea [name] dtg about grief reaction, challenges with loss of independence, life changes, increase in agitation, would benefit from [facility] visit.   Follow up Palliative Care Visit:  PC f/u visit further discussion monitor trends of appetite, weights, monitor for functional, cognitive decline with chronic disease progression, assess any active symptoms, supportive role.Palliative care will continue to follow for complex medical decision making, advance care planning, and clarification of goals. Return 4 to 8 weeks or prn.   I spent 49 minutes providing this consultation 11:50 am. More than 50% of the time in this consultation was spent in counseling and care coordination. PPS: 50% Chief Complaint: Follow up palliative consult for complex medical decision making, address goals, manage ongoing symptoms   HISTORY OF PRESENT ILLNESS:  Chelsea [name] is a 89 y.o. year old female  with multiple medical problems including Vascular Dementia, Htn, COPD, h/o gib, h/o chronic constipation, DM, neuropathy, h/o left hip fx, anxiety, depression. Chelsea [name] resides LTC at [facility], requires assistance with transfers, mobility, though per staff able to do things for herself. Does require prompting for bathing, dressing. Chelsea [name] has been eating with good appetite, per staff. No new changes, no recent falls, wounds, hospitalizations, infections per staff. Purpose of today PC f/u visit further discussion monitor trends of appetite, weights, monitor for functional, cognitive decline with chronic  disease progression, assess any active symptoms, supportive role. Chelsea [name] was intermit irritable during pc visit, though allowed assessment. Chelsea [name] became more irritable when talking about O2. We talked about  appetite, foods, daily routine. Chelsea [name] recent lost her roommate. Support provided, limited with discussion and cognitive impairment. I called dtg Chelsea [name], clinical update provided, talked about recent loss of roommate, will ask Chaplain to visit. We talked about overall decline, debility, chronic progression of dementia in the setting of aging. We talked about irritability with coping strategies. Discussed PC f/u visit further discussion monitor trends of appetite, weights, monitor for functional, cognitive decline with chronic disease progression, assess any active symptoms, supportive role. Therapeutic listening, emotional support provided Questions answered. Updated staff. I called Chelsea [name], Chelsea [name]'s daughter, clinical update discussed, PC visit and recent passing of her room-mate. We talked about chronic disease, grieving, option of chaplain visits. Chelsea [name] wishes, will notify palliative chaplain to request visits. We talked about Chelsea [name] overall decline, functionally, cognitively, more symptoms of anxiety and psych continues to follow.   History obtained from review of EMR, discussion with primary team, and interview with family, facility staff/caregiver and/or Chelsea. [name].  I reviewed available labs, medications, imaging, studies and related documents from the EMR.  Records reviewed and summarized above.  Physical Exam: Constitutional: NAD General: frail appearing, chronically ill female ENMT: oral mucous membranes moist CV: S1S2, RRR Pulmonary: LCTA Abdomen: soft and non tender Skin: warm and dry Neuro:  no generalized weakness Psych: non-anxious affect, Alert, irritable  Thank you for the opportunity to participate in the care of Chelsea. [name]. Please call our office at [phone] if we can be of additional assistance.   Amour Trigg Ihor Gully, NP

## 2022-07-26 ENCOUNTER — Non-Acute Institutional Stay: Payer: Medicare Other | Admitting: Nurse Practitioner

## 2022-07-26 DIAGNOSIS — Z515 Encounter for palliative care: Secondary | ICD-10-CM

## 2022-07-26 DIAGNOSIS — F03918 Unspecified dementia, unspecified severity, with other behavioral disturbance: Secondary | ICD-10-CM

## 2022-07-26 DIAGNOSIS — J189 Pneumonia, unspecified organism: Secondary | ICD-10-CM

## 2022-07-26 NOTE — Progress Notes (Addendum)
[facility] Consult Note Telephone: [phone]  Fax: [phone]    Date of encounter: 07/26/22 7:35 PM PATIENT NAME: Chole Rion [address] [place] [address]   [phone] (home)  DOB: 02-Dec-1930 MRN: [id number] PRIMARY CARE PROVIDER:    [facility]  RESPONSIBLE PARTY:    Contact Information     Name Relation Home Work [place] Daughter [phone]     Havens,Scott Other   [phone]     I met face to face with patient and family in facility. Palliative Care was asked to follow this patient by consultation request of [facility] to address advance care planning and complex medical decision making. This is a follow up visit.                                  ASSESSMENT AND PLAN / RECOMMENDATIONS:  Symptom Management/Plan: 1. Advance Care Planning;  DNR 2. Goals of Care: Goals include to maximize quality of life and symptom management. Our advance care planning conversation included a discussion about:    The value and importance of advance care planning  Exploration of personal, cultural or spiritual beliefs that might influence medical decisions  Exploration of goals of care in the event of a sudden injury or illness  Identification and preparation of a healthcare agent  Review and updating or creation of an advance directive document. 3. Palliative care encounter; Palliative care encounter; Palliative medicine team will continue to support patient, patient's family, and medical team. Visit consisted of counseling and education dealing with the complex and emotionally intense issues of symptom management and palliative care in the setting of serious and potentially life-threatening illness   4. Dementia/weight loss; progressive; supportive role; monitor for decline; reviewed appetite, weights, snacks, meals. Reviewed weights, continue to monitor, 03/17/2022 Weight 152 lbs 06/23/2022 weight  149.4 lbs Will continue to monitor, follow for progression of dementia, currently stable, appetite with weight gain, symptoms and supportive role.   5. PNA recurrent over last 6 months with progressive decline functionally, cognitively with currently being treated with abx; O2 supplemental, solumedrol and inhalation therapy to continue. Lengthy discussion with dtg Gaspar Skeeters will transition to Hospice services with progression to end of life based on currently clinical condition. Will proceed with hospice   6. Grief reaction: recent loss of room-mate, discussed at length with [name] dtg about grief reaction, challenges with loss of independence, life changes, now progressing to end of life, chaplain is part of hospice team and will be able to assist.    I spent 46 minutes providing this consultation 10:00am. More than 50% of the time in this consultation was spent in counseling and care coordination. PPS: 50% Chief Complaint: Follow up palliative consult for complex medical decision making, address goals, manage ongoing symptoms   HISTORY OF PRESENT ILLNESS:  Kristee Britz is a 89 y.o. year old female  with multiple medical problems including Vascular Dementia, Htn, COPD, h/o gib, h/o chronic constipation, DM, neuropathy, h/o left hip fx, anxiety, depression. Ms Wojtkowski resides LTC at [facility], requires assistance with transfers, mobility, though per staff able to do things for herself. Does require prompting for bathing, dressing. Ms Haseley with poor appetite with new dx pna. Currently being treated with abx, solumedrol, O2 supplemental, inhalation therapy. Purpose of today PC f/u visit with recurrent PNA. I visited and observed Ms Nurnberger, she was lethargic,  lifted her head up to make eye contact then returned to sleep. Ms Howrey was cooperative with assessment, support provided. No meaningful discussion with lethargy, weak, ill.  I called Gaspar Skeeters, Ms Rosol's daughter, clinical update discussed, we talked  about clinical update, also ongoing decline, debility with recurrent pna's. We talked about option of hospice services under Medicare benefit. Gaspar Skeeters is familiar with home hospice as her father was under hospice at home. Gaspar Skeeters is in agreement to proceed with hospice. Notified [facility] NP, order for hospice sent.    History obtained from review of EMR, discussion with primary team, and interview with family, facility staff/caregiver and/or Ms. Etherington.  I reviewed available labs, medications, imaging, studies and related documents from the EMR.  Records reviewed and summarized above.  Physical Exam: General: frail appearing, chronically ill female, weak, lethargic ENMT: oral mucous membranes moist CV: S1S2, RRR Pulmonary: rhonchi decreased throughout Abdomen: soft and non tender Skin: warm and dry Neuro:  no generalized weakness Psych: non-anxious affect, Alert, irritable  Thank you for the opportunity to participate in the care of Ms. Stofko. Please call our office at [phone] if we can be of additional assistance.   Adhya Cocco Ihor Gully, NP

## 2022-09-04 DEATH — deceased
